# Patient Record
Sex: Female | Born: 1940 | Race: White | Hispanic: No | Smoking: Never smoker
Health system: Southern US, Community
[De-identification: ages and names within clinical notes are randomized; demographics above are authoritative.]

## PROBLEM LIST (undated history)

## (undated) DIAGNOSIS — F329 Major depressive disorder, single episode, unspecified: Secondary | ICD-10-CM

## (undated) DIAGNOSIS — I10 Essential (primary) hypertension: Secondary | ICD-10-CM

## (undated) DIAGNOSIS — E785 Hyperlipidemia, unspecified: Secondary | ICD-10-CM

## (undated) DIAGNOSIS — G309 Alzheimer's disease, unspecified: Secondary | ICD-10-CM

## (undated) DIAGNOSIS — F32A Depression, unspecified: Secondary | ICD-10-CM

## (undated) DIAGNOSIS — K219 Gastro-esophageal reflux disease without esophagitis: Secondary | ICD-10-CM

## (undated) DIAGNOSIS — F0280 Dementia in other diseases classified elsewhere without behavioral disturbance: Secondary | ICD-10-CM

## (undated) HISTORY — DX: Depression, unspecified: F32.A

## (undated) HISTORY — DX: Gastro-esophageal reflux disease without esophagitis: K21.9

## (undated) HISTORY — DX: Major depressive disorder, single episode, unspecified: F32.9

## (undated) HISTORY — DX: Hyperlipidemia, unspecified: E78.5

## (undated) HISTORY — DX: Essential (primary) hypertension: I10

---

## 1976-03-16 HISTORY — PX: TUBAL LIGATION: SHX77

## 2013-06-29 DIAGNOSIS — R918 Other nonspecific abnormal finding of lung field: Secondary | ICD-10-CM | POA: Insufficient documentation

## 2013-09-20 ENCOUNTER — Encounter: Payer: Self-pay | Admitting: Internal Medicine

## 2013-09-20 ENCOUNTER — Encounter (INDEPENDENT_AMBULATORY_CARE_PROVIDER_SITE_OTHER): Payer: Self-pay

## 2013-09-20 ENCOUNTER — Ambulatory Visit (INDEPENDENT_AMBULATORY_CARE_PROVIDER_SITE_OTHER): Payer: Medicare HMO | Admitting: Internal Medicine

## 2013-09-20 VITALS — BP 124/68 | HR 75 | Temp 98.5°F | Wt 168.0 lb

## 2013-09-20 DIAGNOSIS — I10 Essential (primary) hypertension: Secondary | ICD-10-CM

## 2013-09-20 DIAGNOSIS — F329 Major depressive disorder, single episode, unspecified: Secondary | ICD-10-CM

## 2013-09-20 DIAGNOSIS — G309 Alzheimer's disease, unspecified: Secondary | ICD-10-CM

## 2013-09-20 DIAGNOSIS — F3289 Other specified depressive episodes: Secondary | ICD-10-CM

## 2013-09-20 DIAGNOSIS — K219 Gastro-esophageal reflux disease without esophagitis: Secondary | ICD-10-CM

## 2013-09-20 DIAGNOSIS — F0280 Dementia in other diseases classified elsewhere without behavioral disturbance: Secondary | ICD-10-CM | POA: Insufficient documentation

## 2013-09-20 DIAGNOSIS — R413 Other amnesia: Secondary | ICD-10-CM

## 2013-09-20 DIAGNOSIS — F32A Depression, unspecified: Secondary | ICD-10-CM

## 2013-09-20 DIAGNOSIS — E785 Hyperlipidemia, unspecified: Secondary | ICD-10-CM

## 2013-09-20 MED ORDER — DONEPEZIL HCL 5 MG PO TABS
5.0000 mg | ORAL_TABLET | Freq: Every day | ORAL | Status: DC
Start: 2013-09-20 — End: 2013-10-25

## 2013-09-20 MED ORDER — SERTRALINE HCL 50 MG PO TABS: 50.0000 mg | ORAL_TABLET | Freq: Every day | ORAL | Status: DC

## 2013-09-20 MED ORDER — OMEPRAZOLE 20 MG PO CPDR: 20.0000 mg | DELAYED_RELEASE_CAPSULE | Freq: Every day | ORAL | Status: DC

## 2013-09-20 MED ORDER — HYDROCHLOROTHIAZIDE 12.5 MG PO CAPS: 12.5000 mg | ORAL_CAPSULE | Freq: Every day | ORAL | Status: DC

## 2013-09-20 MED ORDER — SIMVASTATIN 20 MG PO TABS: 20.0000 mg | ORAL_TABLET | Freq: Every day | ORAL | Status: DC

## 2013-09-20 NOTE — Patient Instructions (Addendum)
Fat and Cholesterol Control Diet  Fat and cholesterol levels in your blood and organs are influenced by your diet. High levels of fat and cholesterol may lead to diseases of the heart, small and large blood vessels, gallbladder, liver, and pancreas.  CONTROLLING FAT AND CHOLESTEROL WITH DIET  Although exercise and lifestyle factors are important, your diet is key. That is because certain foods are known to raise cholesterol and others to lower it. The goal is to balance foods for their effect on cholesterol and more importantly, to replace saturated and trans fat with other types of fat, such as monounsaturated fat, polyunsaturated fat, and omega-3 fatty acids.  On average, a person should consume no more than 15 to 17 g of saturated fat daily. Saturated and trans fats are considered "bad" fats, and they will raise LDL cholesterol. Saturated fats are primarily found in animal products such as meats, butter, and cream. However, that does not mean you need to give up all your favorite foods. Today, there are good tasting, low-fat, low-cholesterol substitutes for most of the things you like to eat. Choose low-fat or nonfat alternatives. Choose round or loin cuts of red meat. These types of cuts are lowest in fat and cholesterol. Chicken (without the skin), fish, veal, and ground turkey breast are great choices. Eliminate fatty meats, such as hot dogs and salami. Even shellfish have little or no saturated fat. Have a 3 oz (85 g) portion when you eat lean meat, poultry, or fish.  Trans fats are also called "partially hydrogenated oils." They are oils that have been scientifically manipulated so that they are solid at room temperature resulting in a longer shelf life and improved taste and texture of foods in which they are added. Trans fats are found in stick margarine, some tub margarines, cookies, crackers, and baked goods.   When baking and cooking, oils are a great substitute for butter. The monounsaturated oils are  especially beneficial since it is believed they lower LDL and raise HDL. The oils you should avoid entirely are saturated tropical oils, such as coconut and palm.   Remember to eat a lot from food groups that are naturally free of saturated and trans fat, including fish, fruit, vegetables, beans, grains (barley, rice, couscous, bulgur wheat), and pasta (without cream sauces).   IDENTIFYING FOODS THAT LOWER FAT AND CHOLESTEROL   Soluble fiber may lower your cholesterol. This type of fiber is found in fruits such as apples, vegetables such as broccoli, potatoes, and carrots, legumes such as beans, peas, and lentils, and grains such as barley. Foods fortified with plant sterols (phytosterol) may also lower cholesterol. You should eat at least 2 g per day of these foods for a cholesterol lowering effect.   Read package labels to identify low-saturated fats, trans fat free, and low-fat foods at the supermarket. Select cheeses that have only 2 to 3 g saturated fat per ounce. Use a heart-healthy tub margarine that is free of trans fats or partially hydrogenated oil. When buying baked goods (cookies, crackers), avoid partially hydrogenated oils. Breads and muffins should be made from whole grains (whole-wheat or whole oat flour, instead of "flour" or "enriched flour"). Buy non-creamy canned soups with reduced salt and no added fats.   FOOD PREPARATION TECHNIQUES   Never deep-fry. If you must fry, either stir-fry, which uses very little fat, or use non-stick cooking sprays. When possible, broil, bake, or roast meats, and steam vegetables. Instead of putting butter or margarine on vegetables, use lemon   and herbs, applesauce, and cinnamon (for squash and sweet potatoes). Use nonfat yogurt, salsa, and low-fat dressings for salads.   LOW-SATURATED FAT / LOW-FAT FOOD SUBSTITUTES  Meats / Saturated Fat (g)  · Avoid: Steak, marbled (3 oz/85 g) / 11 g  · Choose: Steak, lean (3 oz/85 g) / 4 g  · Avoid: Hamburger (3 oz/85 g) / 7  g  · Choose: Hamburger, lean (3 oz/85 g) / 5 g  · Avoid: Ham (3 oz/85 g) / 6 g  · Choose: Ham, lean cut (3 oz/85 g) / 2.4 g  · Avoid: Chicken, with skin, dark meat (3 oz/85 g) / 4 g  · Choose: Chicken, skin removed, dark meat (3 oz/85 g) / 2 g  · Avoid: Chicken, with skin, light meat (3 oz/85 g) / 2.5 g  · Choose: Chicken, skin removed, light meat (3 oz/85 g) / 1 g  Dairy / Saturated Fat (g)  · Avoid: Whole milk (1 cup) / 5 g  · Choose: Low-fat milk, 2% (1 cup) / 3 g  · Choose: Low-fat milk, 1% (1 cup) / 1.5 g  · Choose: Skim milk (1 cup) / 0.3 g  · Avoid: Hard cheese (1 oz/28 g) / 6 g  · Choose: Skim milk cheese (1 oz/28 g) / 2 to 3 g  · Avoid: Cottage cheese, 4% fat (1 cup) / 6.5 g  · Choose: Low-fat cottage cheese, 1% fat (1 cup) / 1.5 g  · Avoid: Ice cream (1 cup) / 9 g  · Choose: Sherbet (1 cup) / 2.5 g  · Choose: Nonfat frozen yogurt (1 cup) / 0.3 g  · Choose: Frozen fruit bar / trace  · Avoid: Whipped cream (1 tbs) / 3.5 g  · Choose: Nondairy whipped topping (1 tbs) / 1 g  Condiments / Saturated Fat (g)  · Avoid: Mayonnaise (1 tbs) / 2 g  · Choose: Low-fat mayonnaise (1 tbs) / 1 g  · Avoid: Butter (1 tbs) / 7 g  · Choose: Extra light margarine (1 tbs) / 1 g  · Avoid: Coconut oil (1 tbs) / 11.8 g  · Choose: Olive oil (1 tbs) / 1.8 g  · Choose: Corn oil (1 tbs) / 1.7 g  · Choose: Safflower oil (1 tbs) / 1.2 g  · Choose: Sunflower oil (1 tbs) / 1.4 g  · Choose: Soybean oil (1 tbs) / 2.4 g  · Choose: Canola oil (1 tbs) / 1 g  Document Released: 03/02/2005 Document Revised: 06/27/2012 Document Reviewed: 08/21/2010  ExitCare® Patient Information ©2015 [employer]. This information is not intended to replace advice given to you by your health care provider. Make sure you discuss any questions you have with your health care provider.

## 2013-09-20 NOTE — Assessment & Plan Note (Signed)
Improved on Aricept Medication refilled today

## 2013-09-20 NOTE — Assessment & Plan Note (Signed)
Well controlled HCTZ refilled  For 30 days

## 2013-09-20 NOTE — Progress Notes (Signed)
Subjective:    Patient ID: Teresa Francis, female    DOB: 02/17/1941, 73 y.o.   MRN: [id number]  HPI  Pt presents to the clinic today for medication refills. She is a new pt to me. She has an appt to establish care 10/11/13 but reports she is already out of some of her medications. She would like them refilled until her appt arrives.  HTN: Well controlled on HCTZ. She is currently out of her medication.  HLD: Denies myalgias on Zocor. Does adhere to a low fat diet.  Depression: Controlled on Zoloft.  GERD: No issues on Prilosec.  Memory impairment: On Aricept 5 mg daily.    Review of Systems      Past Medical History  Diagnosis Date  . Hypertension   . Hyperlipidemia   . GERD (gastroesophageal reflux disease)   . Depression     Current Outpatient Prescriptions  Medication Sig Dispense Refill  . aspirin 81 MG tablet Take 81 mg by mouth daily.      . Biotin 1000 MCG tablet Take 1,000 mcg by mouth daily.      . bisacodyl (DULCOLAX) 5 MG EC tablet Take 5 mg by mouth daily as needed for moderate constipation.      Marland Kitchen donepezil (ARICEPT) 5 MG tablet Take 5 mg by mouth at bedtime.      . hydrochlorothiazide (MICROZIDE) 12.5 MG capsule Take 12.5 mg by mouth daily.      Marland Kitchen omeprazole (PRILOSEC) 20 MG capsule Take 20 mg by mouth daily.      . sertraline (ZOLOFT) 50 MG tablet Take 50 mg by mouth daily.      . simvastatin (ZOCOR) 20 MG tablet Take 20 mg by mouth daily.      . Vitamin D, Ergocalciferol, (DRISDOL) 50000 UNITS CAPS capsule Take 50,000 Units by mouth every 7 (seven) days.      . vitamin E 400 UNIT capsule Take 400 Units by mouth daily.       No current facility-administered medications for this visit.    Allergies  Allergen Reactions  . Lisinopril Cough    No family history on file.  History   Social History  . Marital Status: Widowed    Spouse Name: N/A    Number of Children: N/A  . Years of Education: N/A   Occupational History  . Not on file.    Social History Main Topics  . Smoking status: Never Smoker   . Smokeless tobacco: Never Used  . Alcohol Use: No  . Drug Use: Not on file  . Sexual Activity: Not on file   Other Topics Concern  . Not on file   Social History Narrative  . No narrative on file     Constitutional: Denies fever, malaise, fatigue, headache or abrupt weight changes.  Respiratory: Denies difficulty breathing, shortness of breath, cough or sputum production.   Cardiovascular: Denies chest pain, chest tightness, palpitations or swelling in the hands or feet.  Gastrointestinal: Denies abdominal pain, bloating, constipation, diarrhea or blood in the stool.   Neurological: Pt reports difficulty with memory. Denies dizziness, difficulty with speech or problems with balance and coordination.   No other specific complaints in a complete review of systems (except as listed in HPI above).  Objective:   Physical Exam  BP 124/68  Pulse 75  Temp(Src) 98.5 F (36.9 C) (Oral)  Wt 168 lb (76.204 kg)  SpO2 97% Wt Readings from Last 3 Encounters:  09/20/13 168 lb (76.204 kg)  General: Appears her stated age, well developed, well nourished in NAD.  Cardiovascular: Normal rate and rhythm. S1,S2 noted.  No murmur, rubs or gallops noted. No JVD or BLE edema. No carotid bruits noted. Pulmonary/Chest: Normal effort and positive vesicular breath sounds. No respiratory distress. No wheezes, rales or ronchi noted.  Abdomen: Soft and nontender. Normal bowel sounds, no bruits noted. No distention or masses noted. Liver, spleen and kidneys non palpable. Neurological: Alert and oriented. Cranial nerves grossly intact. Psychiatric: Mood and affect normal. Behavior is normal. Judgment and thought content normal.        Assessment & Plan:

## 2013-09-20 NOTE — Assessment & Plan Note (Signed)
Denies myalgias on Simvistatin Medication refilled x 30 days

## 2013-09-20 NOTE — Progress Notes (Signed)
Pre visit review using our clinic review tool, if applicable. No additional management support is needed unless otherwise documented below in the visit note. 

## 2013-09-20 NOTE — Assessment & Plan Note (Signed)
Well controlled on Zoloft Medication refilled today

## 2013-09-20 NOTE — Assessment & Plan Note (Signed)
No issues on prilosec Medication refilled today

## 2013-10-11 ENCOUNTER — Ambulatory Visit: Payer: Self-pay | Admitting: Internal Medicine

## 2013-10-25 ENCOUNTER — Telehealth: Payer: Self-pay | Admitting: Internal Medicine

## 2013-10-25 ENCOUNTER — Encounter: Payer: Self-pay | Admitting: Internal Medicine

## 2013-10-25 ENCOUNTER — Ambulatory Visit (INDEPENDENT_AMBULATORY_CARE_PROVIDER_SITE_OTHER): Payer: Medicare HMO | Admitting: Internal Medicine

## 2013-10-25 ENCOUNTER — Encounter (INDEPENDENT_AMBULATORY_CARE_PROVIDER_SITE_OTHER): Payer: Self-pay

## 2013-10-25 VITALS — BP 138/70 | HR 74 | Temp 98.8°F | Ht 69.25 in | Wt 172.0 lb

## 2013-10-25 DIAGNOSIS — Z1239 Encounter for other screening for malignant neoplasm of breast: Secondary | ICD-10-CM

## 2013-10-25 DIAGNOSIS — M810 Age-related osteoporosis without current pathological fracture: Secondary | ICD-10-CM | POA: Insufficient documentation

## 2013-10-25 DIAGNOSIS — F32A Depression, unspecified: Secondary | ICD-10-CM

## 2013-10-25 DIAGNOSIS — R413 Other amnesia: Secondary | ICD-10-CM

## 2013-10-25 DIAGNOSIS — F3289 Other specified depressive episodes: Secondary | ICD-10-CM

## 2013-10-25 DIAGNOSIS — F329 Major depressive disorder, single episode, unspecified: Secondary | ICD-10-CM

## 2013-10-25 DIAGNOSIS — M899 Disorder of bone, unspecified: Secondary | ICD-10-CM

## 2013-10-25 DIAGNOSIS — E785 Hyperlipidemia, unspecified: Secondary | ICD-10-CM

## 2013-10-25 DIAGNOSIS — M949 Disorder of cartilage, unspecified: Secondary | ICD-10-CM

## 2013-10-25 DIAGNOSIS — B351 Tinea unguium: Secondary | ICD-10-CM

## 2013-10-25 DIAGNOSIS — M8580 Other specified disorders of bone density and structure, unspecified site: Secondary | ICD-10-CM

## 2013-10-25 DIAGNOSIS — I10 Essential (primary) hypertension: Secondary | ICD-10-CM

## 2013-10-25 DIAGNOSIS — K219 Gastro-esophageal reflux disease without esophagitis: Secondary | ICD-10-CM

## 2013-10-25 MED ORDER — HYDROCHLOROTHIAZIDE 12.5 MG PO CAPS: 12.5000 mg | ORAL_CAPSULE | Freq: Every day | ORAL | Status: DC

## 2013-10-25 MED ORDER — SERTRALINE HCL 50 MG PO TABS: 50.0000 mg | ORAL_TABLET | Freq: Every day | ORAL | Status: DC

## 2013-10-25 MED ORDER — OMEPRAZOLE 20 MG PO CPDR: 20.0000 mg | DELAYED_RELEASE_CAPSULE | Freq: Every day | ORAL | Status: DC

## 2013-10-25 MED ORDER — DONEPEZIL HCL 10 MG PO TABS: 10.0000 mg | ORAL_TABLET | Freq: Every day | ORAL | Status: DC

## 2013-10-25 MED ORDER — SIMVASTATIN 20 MG PO TABS: 20.0000 mg | ORAL_TABLET | Freq: Every day | ORAL | Status: DC

## 2013-10-25 NOTE — Progress Notes (Signed)
HPI  Pt presents to the clinic today to establish care. She recently moved [place]. She does have some concerns about a toenail on her left big toe. It is thick and discolored. She has not tried anything OTC. She would like to be referred to a podiatrist.  Flu: 10/20014 Tetanus: more than 10 years ago Pneumovax: 07/2013 Zostovax: never Mammogram: Due Pap Smear: no longer screening Colon Screening: Within the last 5 years Bone Density: more than 5 years ago Eye doctor: 10/2013 Dentist: no-dentures  HTN: Well controlled on HCTZ  HLD: Denies myalgias on zocor  GERD: Controlled on prilosec  Depression: Doing well. Recent increase in zoloft.  Osteopenia: On Vit D supplement.  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 Daughter notices more confusion, repetition and forgetfulness.  Past Medical History  Diagnosis Date  . Hypertension   . Hyperlipidemia   . GERD (gastroesophageal reflux disease)   . Depression     Current Outpatient Prescriptions  Medication Sig Dispense Refill  . aspirin 81 MG tablet Take 81 mg by mouth daily.      . Biotin 1000 MCG tablet Take 1,000 mcg by mouth daily.      . bisacodyl (DULCOLAX) 5 MG EC tablet Take 5 mg by mouth daily as needed for moderate constipation.      Marland Kitchen donepezil (ARICEPT) 5 MG tablet Take 1 tablet (5 mg total) by mouth at bedtime.  30 tablet  0  . hydrochlorothiazide (MICROZIDE) 12.5 MG capsule Take 1 capsule (12.5 mg total) by mouth daily.  30 capsule  0  . omeprazole (PRILOSEC) 20 MG capsule Take 1 capsule (20 mg total) by mouth daily.  30 capsule  0  . sertraline (ZOLOFT) 50 MG tablet Take 1 tablet (50 mg total) by mouth daily.  30 tablet  0  . simvastatin (ZOCOR) 20 MG tablet Take 1 tablet (20 mg total) by mouth daily.  30 tablet  0  . Vitamin D, Ergocalciferol, (DRISDOL) 50000 UNITS CAPS capsule Take 50,000 Units by mouth every 7 (seven) days.      . vitamin E 400 UNIT capsule Take 400 Units by mouth daily.       No current  facility-administered medications for this visit.    Allergies  Allergen Reactions  . Lisinopril Cough    Family History  Problem Relation Age of Onset  . Heart disease Mother   . Hypertension Mother   . Arthritis Father   . Cancer Father     Prostate  . Cancer Sister     Breast  . Cancer Brother     Throat  . Hypertension Daughter   . Cancer Son     Lung  . Diabetes Paternal Aunt   . Diabetes Paternal Uncle   . Diabetes Paternal Grandfather   . Cancer Sister     Breast  . Cancer Sister     Breast  . Cancer Brother     Prostate  . Diabetes Daughter     Uterine    History   Social History  . Marital Status: Widowed    Spouse Name: N/A    Number of Children: N/A  . Years of Education: N/A   Occupational History  . Not on file.   Social History Main Topics  . Smoking status: Never Smoker   . Smokeless tobacco: Never Used  . Alcohol Use: No  . Drug Use: Not on file  . Sexual Activity: Not on file   Other Topics Concern  . Not on  file   Social History Narrative  . No narrative on file    ROS:  Constitutional: Denies fever, malaise, fatigue, headache or abrupt weight changes.  Respiratory: Denies difficulty breathing, shortness of breath, cough or sputum production.   Cardiovascular: Denies chest pain, chest tightness, palpitations or swelling in the hands or feet.  Gastrointestinal: Denies abdominal pain, bloating, constipation, diarrhea or blood in the stool.  Neurological: Pt reports difficulty with memory. Denies dizziness, difficulty with speech or problems with balance and coordination.   No other specific complaints in a complete review of systems (except as listed in HPI above).  PE:  BP 138/70  Pulse 74  Temp(Src) 98.8 F (37.1 C) (Oral)  Ht 5' 9.25" (1.759 m)  Wt 172 lb (78.019 kg)  BMI 25.22 kg/m2  SpO2 97% Wt Readings from Last 3 Encounters:  10/25/13 172 lb (78.019 kg)  09/20/13 168 lb (76.204 kg)    General: Appears her  stated age, well developed, well nourished in NAD. Cardiovascular: Normal rate and rhythm. S1,S2 noted.  No murmur, rubs or gallops noted. No JVD or BLE edema. No carotid bruits noted. Pulmonary/Chest: Normal effort and positive vesicular breath sounds. No respiratory distress. No wheezes, rales or ronchi noted.  Abdomen: Soft and nontender. Normal bowel sounds, no bruits noted. No distention or masses noted. Liver, spleen and kidneys non palpable. Neurological: Alert and oriented.  Psychiatric: Mood and affect normal. Behavior is normal. Judgment and thought content normal.      Assessment and Plan:  Preventative Health Maintenance:  Will order mammogram  Will order bone density She will call insurance about zostovax She declines tetanus vaccine today  Toe fungus:  Will refer to podiatry per pt request  RTC in 6 months or sooner if needed

## 2013-10-25 NOTE — Assessment & Plan Note (Addendum)
Stable on current dose of zoloft

## 2013-10-25 NOTE — Telephone Encounter (Signed)
Relevant patient education assigned to patient using Emmi. ° °

## 2013-10-25 NOTE — Assessment & Plan Note (Signed)
No issues on prilosec

## 2013-10-25 NOTE — Assessment & Plan Note (Signed)
Seems to be worsening Will increase Aricept to 10 mg daily

## 2013-10-25 NOTE — Assessment & Plan Note (Signed)
Well controlled on zocor Will check lipid profile and CMET today

## 2013-10-25 NOTE — Assessment & Plan Note (Signed)
Well controlled Try holding HCTZ Monitor BP, if > 140/90 call me and we will restart medications Will check CBC and CMET today

## 2013-10-25 NOTE — Patient Instructions (Signed)
Ringworm, Nail A fungal infection of the nail (tinea unguium/onychomycosis) is common. It is common as the visible part of the nail is composed of dead cells which have no blood supply to help prevent infection. It occurs because fungi are everywhere and will pick any opportunity to grow on any dead material. Because nails are very slow growing they require up to 2 years of treatment with anti-fungal medications. The entire nail back to the base is infected. This includes approximately  of the nail which you cannot see. If your caregiver has prescribed a medication by mouth, take it every day and as directed. No progress will be seen for at least 6 to 9 months. Do not be disappointed! Because fungi live on dead cells with little or no exposure to blood supply, medication delivery to the infection is slow; thus the cure is slow. It is also why you can observe no progress in the first 6 months. The nail becoming cured is the base of the nail, as it has the blood supply. Topical medication such as creams and ointments are usually not effective. Important in successful treatment of nail fungus is closely following the medication regimen that your doctor prescribes. Sometimes you and your caregiver may elect to speed up this process by surgical removal of all the nails. Even this may still require 6 to 9 months of additional oral medications. See your caregiver as directed. Remember there will be no visible improvement for at least 6 months. See your caregiver sooner if other signs of infection (redness and swelling) develop. Document Released: 02/28/2000 Document Revised: 05/25/2011 Document Reviewed: 05/08/2008 ExitCare Patient Information 2015 [employer]. This information is not intended to replace advice given to you by your health care provider. Make sure you discuss any questions you have with your health care provider.  

## 2013-10-25 NOTE — Progress Notes (Signed)
Pre visit review using our clinic review tool, if applicable. No additional management support is needed unless otherwise documented below in the visit note. 

## 2013-10-25 NOTE — Assessment & Plan Note (Signed)
Will check Vit D today 

## 2013-10-26 LAB — COMPREHENSIVE METABOLIC PANEL
ALT: 11 U/L (ref 0–35)
AST: 20 U/L (ref 0–37)
Albumin: 3.9 g/dL (ref 3.5–5.2)
Alkaline Phosphatase: 78 U/L (ref 39–117)
BILIRUBIN TOTAL: 0.7 mg/dL (ref 0.2–1.2)
BUN: 19 mg/dL (ref 6–23)
CO2: 29 mEq/L (ref 19–32)
Calcium: 9.2 mg/dL (ref 8.4–10.5)
Chloride: 102 mEq/L (ref 96–112)
Creatinine, Ser: 0.9 mg/dL (ref 0.4–1.2)
GFR: 68.4 mL/min (ref >60.00)
Glucose, Bld: 73 mg/dL (ref 70–99)
Potassium: 3.4 mEq/L — ABNORMAL LOW (ref 3.5–5.1)
Sodium: 140 mEq/L (ref 135–145)
Total Protein: 6.8 g/dL (ref 6.0–8.3)

## 2013-10-26 LAB — CBC
HEMATOCRIT: 39.5 % (ref 36.0–46.0)
Hemoglobin: 12.9 g/dL (ref 12.0–15.0)
MCHC: 32.6 g/dL (ref 30.0–36.0)
MCV: 85.5 fl (ref 78.0–100.0)
PLATELETS: 307 10*3/uL (ref 150.0–400.0)
RBC: 4.62 Mil/uL (ref 3.87–5.11)
RDW: 14.4 % (ref 11.5–15.5)
WBC: 8.6 10*3/uL (ref 4.0–10.5)

## 2013-10-26 LAB — VITAMIN D 25 HYDROXY (VIT D DEFICIENCY, FRACTURES): VITD: 55.3 ng/mL (ref 30.00–100.00)

## 2013-10-26 LAB — LIPID PANEL
Cholesterol: 211 mg/dL — ABNORMAL HIGH (ref 0–200)
HDL: 42.7 mg/dL (ref >39.00)
NONHDL: 168.3
TRIGLYCERIDES: 229 mg/dL — AB (ref 0.0–149.0)
Total CHOL/HDL Ratio: 5
VLDL: 45.8 mg/dL — AB (ref 0.0–40.0)

## 2013-10-26 LAB — LDL CHOLESTEROL, DIRECT: Direct LDL: 145.9 mg/dL

## 2013-10-27 MED ORDER — SIMVASTATIN 40 MG PO TABS: 40.0000 mg | ORAL_TABLET | Freq: Every day | ORAL | Status: DC

## 2013-10-27 NOTE — Addendum Note (Signed)
Addended by: Lurlean Nanny on: 10/27/2013 09:27 AM   Modules accepted: Orders

## 2013-11-08 ENCOUNTER — Ambulatory Visit: Payer: Commercial Managed Care - HMO | Admitting: Podiatry

## 2013-11-22 ENCOUNTER — Encounter: Payer: Self-pay | Admitting: Podiatry

## 2013-11-22 ENCOUNTER — Ambulatory Visit (INDEPENDENT_AMBULATORY_CARE_PROVIDER_SITE_OTHER): Payer: Commercial Managed Care - HMO | Admitting: Podiatry

## 2013-11-22 VITALS — BP 158/80 | HR 71 | Resp 16 | Ht 69.0 in | Wt 175.0 lb

## 2013-11-22 DIAGNOSIS — L600 Ingrowing nail: Secondary | ICD-10-CM

## 2013-11-22 MED ORDER — NEOMYCIN-POLYMYXIN-HC 3.5-10000-1 OT SOLN: OTIC | Status: DC

## 2013-11-22 NOTE — Patient Instructions (Signed)

## 2013-11-22 NOTE — Progress Notes (Signed)
   Subjective:    Patient ID: Teresa Francis, female    DOB: 03-02-1941, 73 y.o.   MRN: [id number]  HPI Comments: Its my big toenail on my left foot. Its growing in my skin. If i pick at it, it will hurt. Its been like this for 1 year. Its getting worse. i trim my toenails.     Review of Systems  Skin:       Change in nails  Hematological: Bruises/bleeds easily.  Psychiatric/Behavioral: Positive for confusion.  All other systems reviewed and are negative.      Objective:   Physical Exam: I have reviewed her past medical history medications allergies surgeries social history and review of systems. Pulses are strongly palpable bilateral. Neurologic sensorium is intact per since once the monofilament. Deep tendon reflexes are intact bilateral muscle strength is 5 over 5 dorsiflexors plantar flexors inverters everters all intrinsic musculature is intact. Orthopedic evaluation demonstrates all joints distal to the ankle a full range of motion without crepitation. 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 Cutaneous evaluation demonstrates supple well hydrated cutis with exception of her reactive porokeratotic lesion lateral aspect of the fifth metatarsal head left foot. She also has nail dystrophy to toes #1 and #2 of the left foot and #2 of the right foot. The hallux nail left his extremely painful for her. This is hard to cut and hangs on her clothing.        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  Plan: Total nail avulsion with matrixectomy left hallux today after local anesthetic a been administered. She tolerated procedure well and all was applied she was her soaking on a twice a day basis and Betadine water I will followup with her in one week.

## 2013-11-29 ENCOUNTER — Other Ambulatory Visit: Payer: Self-pay

## 2013-11-29 ENCOUNTER — Ambulatory Visit (INDEPENDENT_AMBULATORY_CARE_PROVIDER_SITE_OTHER): Payer: Commercial Managed Care - HMO | Admitting: Podiatry

## 2013-11-29 ENCOUNTER — Encounter: Payer: Self-pay | Admitting: Podiatry

## 2013-11-29 DIAGNOSIS — R413 Other amnesia: Secondary | ICD-10-CM

## 2013-11-29 DIAGNOSIS — K219 Gastro-esophageal reflux disease without esophagitis: Secondary | ICD-10-CM

## 2013-11-29 DIAGNOSIS — L600 Ingrowing nail: Secondary | ICD-10-CM

## 2013-11-29 MED ORDER — SIMVASTATIN 40 MG PO TABS: 40.0000 mg | ORAL_TABLET | Freq: Every day | ORAL | Status: DC

## 2013-11-29 MED ORDER — OMEPRAZOLE 20 MG PO CPDR: 20.0000 mg | DELAYED_RELEASE_CAPSULE | Freq: Every day | ORAL | Status: DC

## 2013-11-29 MED ORDER — DONEPEZIL HCL 10 MG PO TABS: 10.0000 mg | ORAL_TABLET | Freq: Every day | ORAL | Status: DC

## 2013-11-29 NOTE — Progress Notes (Signed)
She presents today for followup of a total nail avulsion hallux left. She denies fever chills nausea vomiting muscle aches and pains. States that she's been soaking in Betadine warm water. She states that she's had absolutely no pain with this procedure.  Objective: Vital signs are stable she is alert and oriented x3. There is no erythema edema cellulitis drainage or odor does mild areas of reepithelialization which appears to be healing quite nicely. I see no signs of infection.  Assessment: Well-healing surgical toe hallux left.  Plan: Discontinue Betadine start with Epsom salts in warm water soaks continue to cover during the day and leave open at night. Continue to soak and to completely resolved. Been stopped when there is no erythema cellulitis drainage or odor pain.

## 2013-11-29 NOTE — Telephone Encounter (Signed)
Teresa Francis pts daughter having problems getting meds from [facility] mail order and request 2 week rx for donepezil,l omeprazole and simvastatin to [facility]. Advised done.

## 2014-01-16 ENCOUNTER — Telehealth: Payer: Self-pay

## 2014-01-16 NOTE — Telephone Encounter (Signed)
Bevelyn Buckles left v/m; [name] said Webb Silversmith NP had increased dose of simvastatin to 40 mg; Traer said pt already has simvastatin 20 mg rx and [employer] cancelled the simvastatin 40 mg rx. [name] said if pt is supposed to take simvastatin 40 mg; gail request new rx sent to [facility] for simvastatin 40 mg and have simvastatin 20 mg cancelled. [name] request cb.

## 2014-01-16 NOTE — Telephone Encounter (Signed)
Should be on 40 mg zocor daily. Please send new RX

## 2014-01-17 MED ORDER — SIMVASTATIN 40 MG PO TABS: 40.0000 mg | ORAL_TABLET | Freq: Every day | ORAL | Status: DC

## 2014-01-17 NOTE — Telephone Encounter (Signed)
Rx resent to pharmacy and asked to cancel 20mg  Rx

## 2014-01-17 NOTE — Addendum Note (Signed)
Addended by: Lurlean Nanny on: 01/17/2014 10:19 AM   Modules accepted: Orders

## 2014-03-16 HISTORY — PX: CHOLECYSTECTOMY: SHX55

## 2014-05-02 ENCOUNTER — Ambulatory Visit (INDEPENDENT_AMBULATORY_CARE_PROVIDER_SITE_OTHER): Payer: Commercial Managed Care - HMO | Admitting: Internal Medicine

## 2014-05-02 ENCOUNTER — Encounter: Payer: Self-pay | Admitting: Internal Medicine

## 2014-05-02 VITALS — BP 140/90 | HR 68 | Temp 98.5°F | Wt 177.0 lb

## 2014-05-02 DIAGNOSIS — R413 Other amnesia: Secondary | ICD-10-CM

## 2014-05-02 DIAGNOSIS — Z23 Encounter for immunization: Secondary | ICD-10-CM | POA: Diagnosis not present

## 2014-05-02 DIAGNOSIS — F32A Depression, unspecified: Secondary | ICD-10-CM

## 2014-05-02 DIAGNOSIS — K219 Gastro-esophageal reflux disease without esophagitis: Secondary | ICD-10-CM | POA: Diagnosis not present

## 2014-05-02 DIAGNOSIS — M8580 Other specified disorders of bone density and structure, unspecified site: Secondary | ICD-10-CM | POA: Diagnosis not present

## 2014-05-02 DIAGNOSIS — F329 Major depressive disorder, single episode, unspecified: Secondary | ICD-10-CM

## 2014-05-02 DIAGNOSIS — E785 Hyperlipidemia, unspecified: Secondary | ICD-10-CM

## 2014-05-02 DIAGNOSIS — I10 Essential (primary) hypertension: Secondary | ICD-10-CM

## 2014-05-02 MED ORDER — OMEPRAZOLE 40 MG PO CPDR: 40.0000 mg | DELAYED_RELEASE_CAPSULE | Freq: Every day | ORAL | Status: DC

## 2014-05-02 MED ORDER — DONEPEZIL HCL 10 MG PO TABS: 10.0000 mg | ORAL_TABLET | Freq: Every day | ORAL | Status: DC

## 2014-05-02 MED ORDER — SERTRALINE HCL 50 MG PO TABS: 50.0000 mg | ORAL_TABLET | Freq: Every day | ORAL | Status: DC

## 2014-05-02 NOTE — Patient Instructions (Signed)
Fat and Cholesterol Control Diet Fat and cholesterol levels in your blood and organs are influenced by your diet. High levels of fat and cholesterol may lead to diseases of the heart, small and large blood vessels, gallbladder, liver, and pancreas. CONTROLLING FAT AND CHOLESTEROL WITH DIET Although exercise and lifestyle factors are important, your diet is key. That is because certain foods are known to raise cholesterol and others to lower it. The goal is to balance foods for their effect on cholesterol and more importantly, to replace saturated and trans fat with other types of fat, such as monounsaturated fat, polyunsaturated fat, and omega-3 fatty acids. On average, a person should consume no more than 15 to 17 g of saturated fat daily. Saturated and trans fats are considered "bad" fats, and they will raise LDL cholesterol. Saturated fats are primarily found in animal products such as meats, butter, and cream. However, that does not mean you need to give up all your favorite foods. Today, there are good tasting, low-fat, low-cholesterol substitutes for most of the things you like to eat. Choose low-fat or nonfat alternatives. Choose round or loin cuts of red meat. These types of cuts are lowest in fat and cholesterol. Chicken (without the skin), fish, veal, and ground turkey breast are great choices. Eliminate fatty meats, such as hot dogs and salami. Even shellfish have little or no saturated fat. Have a 3 oz (85 g) portion when you eat lean meat, poultry, or fish. Trans fats are also called "partially hydrogenated oils." They are oils that have been scientifically manipulated so that they are solid at room temperature resulting in a longer shelf life and improved taste and texture of foods in which they are added. Trans fats are found in stick margarine, some tub margarines, cookies, crackers, and baked goods.  When baking and cooking, oils are a great substitute for butter. The monounsaturated oils are  especially beneficial since it is believed they lower LDL and raise HDL. The oils you should avoid entirely are saturated tropical oils, such as coconut and palm.  Remember to eat a lot from food groups that are naturally free of saturated and trans fat, including fish, fruit, vegetables, beans, grains (barley, rice, couscous, bulgur wheat), and pasta (without cream sauces).  IDENTIFYING FOODS THAT LOWER FAT AND CHOLESTEROL  Soluble fiber may lower your cholesterol. This type of fiber is found in fruits such as apples, vegetables such as broccoli, potatoes, and carrots, legumes such as beans, peas, and lentils, and grains such as barley. Foods fortified with plant sterols (phytosterol) may also lower cholesterol. You should eat at least 2 g per day of these foods for a cholesterol lowering effect.  Read package labels to identify low-saturated fats, trans fat free, and low-fat foods at the supermarket. Select cheeses that have only 2 to 3 g saturated fat per ounce. Use a heart-healthy tub margarine that is free of trans fats or partially hydrogenated oil. When buying baked goods (cookies, crackers), avoid partially hydrogenated oils. Breads and muffins should be made from whole grains (whole-wheat or whole oat flour, instead of "flour" or "enriched flour"). Buy non-creamy canned soups with reduced salt and no added fats.  FOOD PREPARATION TECHNIQUES  Never deep-fry. If you must fry, either stir-fry, which uses very little fat, or use non-stick cooking sprays. When possible, broil, bake, or roast meats, and steam vegetables. Instead of putting butter or margarine on vegetables, use lemon and herbs, applesauce, and cinnamon (for squash and sweet potatoes). Use nonfat   yogurt, salsa, and low-fat dressings for salads.  LOW-SATURATED FAT / LOW-FAT FOOD SUBSTITUTES Meats / Saturated Fat (g)  Avoid: Steak, marbled (3 oz/85 g) / 11 g  Choose: Steak, lean (3 oz/85 g) / 4 g  Avoid: Hamburger (3 oz/85 g) / 7  g  Choose: Hamburger, lean (3 oz/85 g) / 5 g  Avoid: Ham (3 oz/85 g) / 6 g  Choose: Ham, lean cut (3 oz/85 g) / 2.4 g  Avoid: Chicken, with skin, dark meat (3 oz/85 g) / 4 g  Choose: Chicken, skin removed, dark meat (3 oz/85 g) / 2 g  Avoid: Chicken, with skin, light meat (3 oz/85 g) / 2.5 g  Choose: Chicken, skin removed, light meat (3 oz/85 g) / 1 g Dairy / Saturated Fat (g)  Avoid: Whole milk (1 cup) / 5 g  Choose: Low-fat milk, 2% (1 cup) / 3 g  Choose: Low-fat milk, 1% (1 cup) / 1.5 g  Choose: Skim milk (1 cup) / 0.3 g  Avoid: Hard cheese (1 oz/28 g) / 6 g  Choose: Skim milk cheese (1 oz/28 g) / 2 to 3 g  Avoid: Cottage cheese, 4% fat (1 cup) / 6.5 g  Choose: Low-fat cottage cheese, 1% fat (1 cup) / 1.5 g  Avoid: Ice cream (1 cup) / 9 g  Choose: Sherbet (1 cup) / 2.5 g  Choose: Nonfat frozen yogurt (1 cup) / 0.3 g  Choose: Frozen fruit bar / trace  Avoid: Whipped cream (1 tbs) / 3.5 g  Choose: Nondairy whipped topping (1 tbs) / 1 g Condiments / Saturated Fat (g)  Avoid: Mayonnaise (1 tbs) / 2 g  Choose: Low-fat mayonnaise (1 tbs) / 1 g  Avoid: Butter (1 tbs) / 7 g  Choose: Extra light margarine (1 tbs) / 1 g  Avoid: Coconut oil (1 tbs) / 11.8 g  Choose: Olive oil (1 tbs) / 1.8 g  Choose: Corn oil (1 tbs) / 1.7 g  Choose: Safflower oil (1 tbs) / 1.2 g  Choose: Sunflower oil (1 tbs) / 1.4 g  Choose: Soybean oil (1 tbs) / 2.4 g  Choose: Canola oil (1 tbs) / 1 g Document Released: 03/02/2005 Document Revised: 06/27/2012 Document Reviewed: 05/31/2013 [employer] Patient Information 2015 [employer]. This information is not intended to replace advice given to you by your health care provider. Make sure you discuss any questions you have with your health care provider.  

## 2014-05-02 NOTE — Assessment & Plan Note (Signed)
She is not taking any calcium or vit D supplement She never did her bone density ordered 10/2013 Will have her see Rosaria Ferries on the way out to schedule bone density

## 2014-05-02 NOTE — Assessment & Plan Note (Signed)
Mild but stable Continue Aricept at this time

## 2014-05-02 NOTE — Assessment & Plan Note (Signed)
Stable on current dose of Zoloft  Wean not appropriate

## 2014-05-02 NOTE — Assessment & Plan Note (Addendum)
No issues on Zocor Will check CMET and Lipid profile today Encouraged low fat diet

## 2014-05-02 NOTE — Progress Notes (Addendum)
Subjective:    Patient ID: Teresa Francis, female    DOB: 02-27-1941, 74 y.o.   MRN: [id number]  HPI  Pt presents to the clinic today for 6 month follow up of chronic medical conditions.  Depression: Mood stable on current dose of Zoloft.  HTN: Not medicated. BP today 140/90.  GERD: She reports belching. She is taking Prilosec 20 mg daily.  HLD: Denies myalgias on Zocor. Last lipid profile reviewed. LDL.  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  Osteopenia: Bone density was ordered 10/2013 but not completed.  Review of Systems      Past Medical History  Diagnosis Date  . Hypertension   . Hyperlipidemia   . GERD (gastroesophageal reflux disease)   . Depression     Current Outpatient Prescriptions  Medication Sig Dispense Refill  . aspirin 81 MG tablet Take 81 mg by mouth daily.    . Biotin 1000 MCG tablet Take 1,000 mcg by mouth daily.    . bisacodyl (DULCOLAX) 5 MG EC tablet Take 5 mg by mouth daily as needed for moderate constipation.    Marland Kitchen donepezil (ARICEPT) 10 MG tablet Take 1 tablet (10 mg total) by mouth at bedtime. 14 tablet 1  . neomycin-polymyxin-hydrocortisone (CORTISPORIN) otic solution Apply one to two drops to toe after soaking twice daily. 10 mL 1  . omeprazole (PRILOSEC) 20 MG capsule Take 1 capsule (20 mg total) by mouth daily. 14 capsule 1  . sertraline (ZOLOFT) 50 MG tablet Take 1 tablet (50 mg total) by mouth daily. 90 tablet 1  . simvastatin (ZOCOR) 40 MG tablet Take 1 tablet (40 mg total) by mouth daily. 90 tablet 1  . Vitamin D, Ergocalciferol, (DRISDOL) 50000 UNITS CAPS capsule Take 50,000 Units by mouth every 7 (seven) days.    . vitamin E 400 UNIT capsule Take 400 Units by mouth daily.     No current facility-administered medications for this visit.    Allergies  Allergen Reactions  . Lisinopril Cough    Family History  Problem Relation Age of Onset  . Heart disease Mother   . Hypertension Mother   . Arthritis Father   . Cancer  Father     Prostate  . Cancer Sister     Breast  . Cancer Brother     Throat  . Hypertension Daughter   . Cancer Son     Lung  . Diabetes Paternal Aunt   . Diabetes Paternal Uncle   . Diabetes Paternal Grandfather   . Cancer Sister     Breast  . Cancer Sister     Breast  . Cancer Brother     Prostate  . Diabetes Daughter     Uterine  . Stroke Neg Hx     History   Social History  . Marital Status: Widowed    Spouse Name: N/A  . Number of Children: N/A  . Years of Education: N/A   Occupational History  . Not on file.   Social History Main Topics  . Smoking status: Never Smoker   . Smokeless tobacco: Never Used  . Alcohol Use: No  . Drug Use: No  . Sexual Activity: No   Other Topics Concern  . Not on file   Social History Narrative  . No narrative on file     Constitutional: Denies fever, malaise, fatigue, headache or abrupt weight changes.  Respiratory: Denies difficulty breathing, shortness of breath, cough or sputum production.   Cardiovascular: Denies chest pain, chest  tightness, palpitations or swelling in the hands or feet.  Gastrointestinal: Pt reports belching. Denies abdominal pain, bloating, constipation, diarrhea or blood in the stool.  Musculoskeletal: Denies decrease in range of motion, difficulty with gait, muscle pain or joint pain and swelling.  Skin: Denies redness, rashes, lesions or ulcercations.  Neurological: Denies dizziness, difficulty with memory, difficulty with speech or problems with balance and coordination.  Psych: Pt reports history of depression. Pt denies anxiety, SI/HI.  No other specific complaints in a complete review of systems (except as listed in HPI above).  Objective:   Physical Exam   BP 140/90 mmHg  Pulse 68  Temp(Src) 98.5 F (36.9 C) (Oral)  Wt 177 lb (80.287 kg)  SpO2 95% Wt Readings from Last 3 Encounters:  05/02/14 177 lb (80.287 kg)  11/22/13 175 lb (79.379 kg)  10/25/13 172 lb (78.019 kg)     General: Appears her stated age, well developed, well nourished in NAD. Skin: Warm, dry and intact. No rashes, lesions or ulcerations noted. Cardiovascular: Normal rate and rhythm. S1,S2 noted.  No murmur, rubs or gallops noted. No JVD or BLE edema. No carotid bruits noted. Pulmonary/Chest: Normal effort and positive vesicular breath sounds. No respiratory distress. No wheezes, rales or ronchi noted.  Abdomen: Soft and nontender. Normal bowel sounds, no bruits noted. No distention or masses noted. Liver, spleen and kidneys non palpable. Neurological: Engages, pleasent. Year: 1916, 2016. President: Tawni Pummel, wait that's not right, I don't know his name but he is black. Recall: WORLD, ELROW. Psychiatric: Mood and affect normal. Behavior is normal. Judgment and thought content normal.     BMET    Component Value Date/Time   NA 140 10/25/2013 1548   K 3.4* 10/25/2013 1548   CL 102 10/25/2013 1548   CO2 29 10/25/2013 1548   GLUCOSE 73 10/25/2013 1548   BUN 19 10/25/2013 1548   CREATININE 0.9 10/25/2013 1548   CALCIUM 9.2 10/25/2013 1548    Lipid Panel     Component Value Date/Time   CHOL 211* 10/25/2013 1548   TRIG 229.0* 10/25/2013 1548   HDL 42.70 10/25/2013 1548   CHOLHDL 5 10/25/2013 1548   VLDL 45.8* 10/25/2013 1548    CBC    Component Value Date/Time   WBC 8.6 10/25/2013 1548   RBC 4.62 10/25/2013 1548   HGB 12.9 10/25/2013 1548   HCT 39.5 10/25/2013 1548   PLT 307.0 10/25/2013 1548   MCV 85.5 10/25/2013 1548   MCHC 32.6 10/25/2013 1548   RDW 14.4 10/25/2013 1548    Hgb A1C No results found for: HGBA1C      Assessment & Plan:

## 2014-05-02 NOTE — Assessment & Plan Note (Signed)
Deteriorated Will increase Prilosec to 40 mg daily Handout given on diet information for GERD

## 2014-05-02 NOTE — Assessment & Plan Note (Signed)
Borderline today Not medicated Will check CBC and CMET today

## 2014-05-03 LAB — COMPREHENSIVE METABOLIC PANEL
ALK PHOS: 94 U/L (ref 39–117)
ALT: 11 U/L (ref 0–35)
AST: 21 U/L (ref 0–37)
Albumin: 4.2 g/dL (ref 3.5–5.2)
BUN: 19 mg/dL (ref 6–23)
CALCIUM: 9.6 mg/dL (ref 8.4–10.5)
CHLORIDE: 103 meq/L (ref 96–112)
CO2: 30 mEq/L (ref 19–32)
Creatinine, Ser: 0.98 mg/dL (ref 0.40–1.20)
GFR: 58.74 mL/min — ABNORMAL LOW (ref >60.00)
Glucose, Bld: 78 mg/dL (ref 70–99)
POTASSIUM: 4.1 meq/L (ref 3.5–5.1)
SODIUM: 140 meq/L (ref 135–145)
TOTAL PROTEIN: 7.2 g/dL (ref 6.0–8.3)
Total Bilirubin: 0.6 mg/dL (ref 0.2–1.2)

## 2014-05-03 LAB — LIPID PANEL
CHOL/HDL RATIO: 4
CHOLESTEROL: 199 mg/dL (ref 0–200)
HDL: 47.8 mg/dL (ref >39.00)
LDL Cholesterol: 121 mg/dL — ABNORMAL HIGH (ref 0–99)
NonHDL: 151.2
Triglycerides: 151 mg/dL — ABNORMAL HIGH (ref 0.0–149.0)
VLDL: 30.2 mg/dL (ref 0.0–40.0)

## 2014-05-03 LAB — CBC
HEMATOCRIT: 42.9 % (ref 36.0–46.0)
Hemoglobin: 14.3 g/dL (ref 12.0–15.0)
MCHC: 33.3 g/dL (ref 30.0–36.0)
MCV: 84.6 fl (ref 78.0–100.0)
Platelets: 301 10*3/uL (ref 150.0–400.0)
RBC: 5.07 Mil/uL (ref 3.87–5.11)
RDW: 14.8 % (ref 11.5–15.5)
WBC: 9.4 10*3/uL (ref 4.0–10.5)

## 2014-05-04 NOTE — Addendum Note (Signed)
Addended by: Lurlean Nanny on: 05/04/2014 04:04 PM   Modules accepted: Orders

## 2014-05-07 ENCOUNTER — Encounter: Payer: Self-pay | Admitting: Internal Medicine

## 2014-05-08 ENCOUNTER — Other Ambulatory Visit: Payer: Self-pay | Admitting: Internal Medicine

## 2014-05-08 DIAGNOSIS — R413 Other amnesia: Secondary | ICD-10-CM

## 2014-05-08 MED ORDER — DONEPEZIL HCL 10 MG PO TABS: 10.0000 mg | ORAL_TABLET | Freq: Every day | ORAL | Status: DC

## 2014-05-08 MED ORDER — OMEPRAZOLE 40 MG PO CPDR: 40.0000 mg | DELAYED_RELEASE_CAPSULE | Freq: Every day | ORAL | Status: DC

## 2014-05-27 DIAGNOSIS — R05 Cough: Secondary | ICD-10-CM | POA: Diagnosis not present

## 2014-05-27 DIAGNOSIS — K810 Acute cholecystitis: Secondary | ICD-10-CM | POA: Diagnosis not present

## 2014-05-27 DIAGNOSIS — K8020 Calculus of gallbladder without cholecystitis without obstruction: Secondary | ICD-10-CM | POA: Diagnosis not present

## 2014-05-28 DIAGNOSIS — I96 Gangrene, not elsewhere classified: Secondary | ICD-10-CM | POA: Diagnosis not present

## 2014-05-28 DIAGNOSIS — K810 Acute cholecystitis: Secondary | ICD-10-CM | POA: Diagnosis not present

## 2014-05-28 DIAGNOSIS — I10 Essential (primary) hypertension: Secondary | ICD-10-CM | POA: Diagnosis not present

## 2014-05-28 DIAGNOSIS — K219 Gastro-esophageal reflux disease without esophagitis: Secondary | ICD-10-CM | POA: Diagnosis not present

## 2014-05-28 DIAGNOSIS — R05 Cough: Secondary | ICD-10-CM | POA: Diagnosis not present

## 2014-05-28 DIAGNOSIS — Z808 Family history of malignant neoplasm of other organs or systems: Secondary | ICD-10-CM | POA: Diagnosis not present

## 2014-05-28 DIAGNOSIS — Z801 Family history of malignant neoplasm of trachea, bronchus and lung: Secondary | ICD-10-CM | POA: Diagnosis not present

## 2014-05-28 DIAGNOSIS — Z800 Family history of malignant neoplasm of digestive organs: Secondary | ICD-10-CM | POA: Diagnosis not present

## 2014-05-28 DIAGNOSIS — D72829 Elevated white blood cell count, unspecified: Secondary | ICD-10-CM | POA: Diagnosis not present

## 2014-05-28 DIAGNOSIS — F0390 Unspecified dementia without behavioral disturbance: Secondary | ICD-10-CM | POA: Diagnosis not present

## 2014-05-28 DIAGNOSIS — K8020 Calculus of gallbladder without cholecystitis without obstruction: Secondary | ICD-10-CM | POA: Diagnosis not present

## 2014-05-28 DIAGNOSIS — Z803 Family history of malignant neoplasm of breast: Secondary | ICD-10-CM | POA: Diagnosis not present

## 2014-05-28 DIAGNOSIS — K819 Cholecystitis, unspecified: Secondary | ICD-10-CM | POA: Diagnosis not present

## 2014-05-28 DIAGNOSIS — E780 Pure hypercholesterolemia: Secondary | ICD-10-CM | POA: Diagnosis not present

## 2014-05-28 DIAGNOSIS — K8000 Calculus of gallbladder with acute cholecystitis without obstruction: Secondary | ICD-10-CM | POA: Diagnosis not present

## 2014-07-15 ENCOUNTER — Other Ambulatory Visit: Payer: Self-pay | Admitting: Internal Medicine

## 2014-10-24 ENCOUNTER — Other Ambulatory Visit: Payer: Self-pay | Admitting: Internal Medicine

## 2014-10-24 DIAGNOSIS — E785 Hyperlipidemia, unspecified: Secondary | ICD-10-CM

## 2014-10-24 DIAGNOSIS — Z13820 Encounter for screening for osteoporosis: Secondary | ICD-10-CM

## 2014-10-29 ENCOUNTER — Other Ambulatory Visit: Payer: Medicare HMO

## 2014-10-29 ENCOUNTER — Other Ambulatory Visit (INDEPENDENT_AMBULATORY_CARE_PROVIDER_SITE_OTHER): Payer: Commercial Managed Care - HMO

## 2014-10-29 DIAGNOSIS — Z13820 Encounter for screening for osteoporosis: Secondary | ICD-10-CM | POA: Diagnosis not present

## 2014-10-29 DIAGNOSIS — E785 Hyperlipidemia, unspecified: Secondary | ICD-10-CM

## 2014-10-29 LAB — COMPREHENSIVE METABOLIC PANEL
ALBUMIN: 4.1 g/dL (ref 3.5–5.2)
ALK PHOS: 96 U/L (ref 39–117)
ALT: 9 U/L (ref 0–35)
AST: 17 U/L (ref 0–37)
BILIRUBIN TOTAL: 0.7 mg/dL (ref 0.2–1.2)
BUN: 19 mg/dL (ref 6–23)
CO2: 31 mEq/L (ref 19–32)
Calcium: 9.3 mg/dL (ref 8.4–10.5)
Chloride: 104 mEq/L (ref 96–112)
Creatinine, Ser: 0.85 mg/dL (ref 0.40–1.20)
GFR: 69.14 mL/min (ref >60.00)
GLUCOSE: 96 mg/dL (ref 70–99)
Potassium: 4.1 mEq/L (ref 3.5–5.1)
SODIUM: 141 meq/L (ref 135–145)
Total Protein: 7.1 g/dL (ref 6.0–8.3)

## 2014-10-29 LAB — CBC
HEMATOCRIT: 41.9 % (ref 36.0–46.0)
HEMOGLOBIN: 13.7 g/dL (ref 12.0–15.0)
MCHC: 32.7 g/dL (ref 30.0–36.0)
MCV: 84.2 fl (ref 78.0–100.0)
Platelets: 309 10*3/uL (ref 150.0–400.0)
RBC: 4.98 Mil/uL (ref 3.87–5.11)
RDW: 14.6 % (ref 11.5–15.5)
WBC: 7.7 10*3/uL (ref 4.0–10.5)

## 2014-10-29 LAB — LIPID PANEL
CHOLESTEROL: 198 mg/dL (ref 0–200)
HDL: 42.7 mg/dL (ref >39.00)
LDL Cholesterol: 117 mg/dL — ABNORMAL HIGH (ref 0–99)
NONHDL: 155.21
Total CHOL/HDL Ratio: 5
Triglycerides: 191 mg/dL — ABNORMAL HIGH (ref 0.0–149.0)
VLDL: 38.2 mg/dL (ref 0.0–40.0)

## 2014-10-30 LAB — VITAMIN D 25 HYDROXY (VIT D DEFICIENCY, FRACTURES): VITD: 24.48 ng/mL — ABNORMAL LOW (ref 30.00–100.00)

## 2014-10-31 ENCOUNTER — Encounter: Payer: Self-pay | Admitting: Internal Medicine

## 2014-10-31 ENCOUNTER — Ambulatory Visit (INDEPENDENT_AMBULATORY_CARE_PROVIDER_SITE_OTHER): Payer: Commercial Managed Care - HMO | Admitting: Internal Medicine

## 2014-10-31 VITALS — BP 146/82 | HR 86 | Temp 98.5°F | Ht 69.33 in | Wt 187.0 lb

## 2014-10-31 DIAGNOSIS — Z0000 Encounter for general adult medical examination without abnormal findings: Secondary | ICD-10-CM

## 2014-10-31 DIAGNOSIS — Z780 Asymptomatic menopausal state: Secondary | ICD-10-CM | POA: Diagnosis not present

## 2014-10-31 DIAGNOSIS — E785 Hyperlipidemia, unspecified: Secondary | ICD-10-CM

## 2014-10-31 DIAGNOSIS — I10 Essential (primary) hypertension: Secondary | ICD-10-CM

## 2014-10-31 DIAGNOSIS — R413 Other amnesia: Secondary | ICD-10-CM

## 2014-10-31 DIAGNOSIS — K219 Gastro-esophageal reflux disease without esophagitis: Secondary | ICD-10-CM

## 2014-10-31 DIAGNOSIS — F329 Major depressive disorder, single episode, unspecified: Secondary | ICD-10-CM

## 2014-10-31 DIAGNOSIS — F32A Depression, unspecified: Secondary | ICD-10-CM

## 2014-10-31 DIAGNOSIS — M8580 Other specified disorders of bone density and structure, unspecified site: Secondary | ICD-10-CM

## 2014-10-31 MED ORDER — MEMANTINE HCL 5 MG PO TABS: 5.0000 mg | ORAL_TABLET | Freq: Two times a day (BID) | ORAL | Status: DC

## 2014-10-31 NOTE — Assessment & Plan Note (Signed)
No issues on current Zoloft Will check CMET today

## 2014-10-31 NOTE — Patient Instructions (Signed)

## 2014-10-31 NOTE — Progress Notes (Signed)
HPI:  Pt presents to the clinic today for her Medicare Wellness Exam. She is also due for 6 month follow up of chronic conditions.  Depression: Mood stable on current dose of Zoloft. Her daughter reports she has a few days every few months where she gets a little down but overall is doing well.  HTN: Not medicated. BP today is 146/82.  GERD: She denies breakthrough reflux. She is taking Prilosec daily.  HLD: Denies myalgias on Zocor. Last lipid profile reviewed. LDL 121.  Memory Impairment: Her daughter reports worsening short term memory. She is taking Aricept daily.  Osteopenia: Bone density was ordered 10/2013 but not completed.  Past Medical History  Diagnosis Date  . Hypertension   . Hyperlipidemia   . GERD (gastroesophageal reflux disease)   . Depression     Current Outpatient Prescriptions  Medication Sig Dispense Refill  . aspirin 81 MG tablet Take 81 mg by mouth daily.    Marland Kitchen donepezil (ARICEPT) 10 MG tablet Take 1 tablet (10 mg total) by mouth at bedtime. 90 tablet 1  . loratadine (CLARITIN) 10 MG tablet Take 1 tablet by mouth daily as needed.    Marland Kitchen omeprazole (PRILOSEC) 40 MG capsule Take 1 capsule (40 mg total) by mouth daily. 90 capsule 1  . sertraline (ZOLOFT) 50 MG tablet Take 1 tablet (50 mg total) by mouth daily. 90 tablet 1  . simvastatin (ZOCOR) 40 MG tablet TAKE 1 TABLET EVERY DAY (REPLACES SIMVASTATIN 20MG ) 90 tablet 1  . vitamin E 400 UNIT capsule Take 400 Units by mouth daily.     No current facility-administered medications for this visit.    Allergies  Allergen Reactions  . Lisinopril Cough    Family History  Problem Relation Age of Onset  . Heart disease Mother   . Hypertension Mother   . Arthritis Father   . Cancer Father     Prostate  . Cancer Sister     Breast  . Cancer Brother     Throat  . Hypertension Daughter   . Cancer Son     Lung  . Diabetes Paternal Aunt   . Diabetes Paternal Uncle   . Diabetes Paternal Grandfather   . Cancer  Sister     Breast  . Cancer Sister     Breast  . Cancer Brother     Prostate  . Diabetes Daughter     Uterine  . Stroke Neg Hx     Social History   Social History  . Marital Status: Widowed    Spouse Name: N/A  . Number of Children: N/A  . Years of Education: N/A   Occupational History  . Not on file.   Social History Main Topics  . Smoking status: Never Smoker   . Smokeless tobacco: Never Used  . Alcohol Use: No  . Drug Use: No  . Sexual Activity: No   Other Topics Concern  . Not on file   Social History Narrative    Hospitiliaztions: She had a cholecystectomy in [place], 05/2014  Health Maintenance:    Flu: 04/2014  Tetanus: unsure  Pneumovax: She thinks she had this in 2014  Prevnar: 07/2013  Zostavax: never, did have chicken pox  Bone Density: unsure  Colonoscopy: She thinks it has been  <5 years  Eye Doctor: 08/2013 at [place] Exam: no, dentures  Mammogram: Unsure of her last one, did have it at [facility]  Pap: no longer screening    Providers:  PCP: Webb Silversmith, NP-C  Podiatrist: Dr. Milinda Pointer    I have personally reviewed and have noted:  1. The patient's medical and social history 2. Their use of alcohol, tobacco or illicit drugs 3. Their current medications and supplements 4. The patient's functional ability including ADL's, fall risks, home safety  risks and hearing or visual impairment. 5. Diet and physical activities 6. Evidence for depression or mood disorder  Subjective:   Review of Systems:   Constitutional: Denies fever, malaise, fatigue, headache or abrupt weight changes.  HEENT: Pt reports runny nose. Denies eye pain, eye redness, ear pain, ringing in the ears, wax buildup, nasal congestion, bloody nose, or sore throat. Respiratory: Denies difficulty breathing, shortness of breath, cough or sputum production.   Cardiovascular: Denies chest pain, chest tightness, palpitations or swelling in the hands or feet.   Gastrointestinal: Denies abdominal pain, bloating, constipation, diarrhea or blood in the stool.  GU: Denies urgency, frequency, pain with urination, burning sensation, blood in urine, odor or discharge. Musculoskeletal: Denies decrease in range of motion, difficulty with gait, muscle pain or joint pain and swelling.  Skin: Denies redness, rashes, lesions or ulcercations.  Neurological: Denies dizziness, difficulty with memory, difficulty with speech or problems with balance and coordination.  Psych: Pt has history of depression.Denies anxiety, SI/HI.  No other specific complaints in a complete review of systems (except as listed in HPI above).  Objective:  PE:   BP 146/82 mmHg  Pulse 86  Temp(Src) 98.5 F (36.9 C) (Oral)  Ht 5' 9.33" (1.761 m)  Wt 187 lb (84.823 kg)  BMI 27.35 kg/m2  SpO2 97%  Wt Readings from Last 3 Encounters:  05/02/14 177 lb (80.287 kg)  11/22/13 175 lb (79.379 kg)  10/25/13 172 lb (78.019 kg)    General: Appears her stated age, well developed, well nourished in NAD. Skin: Warm, dry and intact. No rashes, lesions or ulcerations noted. HEENT: Head: normal shape and size; Eyes: sclera white, no icterus, conjunctiva pink, PERRLA and EOMs intact; Ears: Tm's gray and intact, normal light reflex; Nose: mucosa pink and moist, septum midline; Throat/Mouth: Teeth present, mucosa pink and moist, no exudate, lesions or ulcerations noted.  Neck: Neck supple, trachea midline. No masses, lumps or thyromegaly present.  Cardiovascular: Normal rate and rhythm. S1,S2 noted.  No murmur, rubs or gallops noted. No JVD or BLE edema. No carotid bruits noted. Pulmonary/Chest: Normal effort and positive vesicular breath sounds. No respiratory distress. No wheezes, rales or ronchi noted.  Abdomen: Soft and nontender. Normal bowel sounds, no bruits noted. No distention or masses noted. Liver, spleen and kidneys non palpable. Neurological: Alert and oriented. She has noted short term  memory loss. Coordination normal.  Psychiatric: She engages. She is happy, and laughing. She makes good eye contact.    BMET    Component Value Date/Time   NA 141 10/29/2014 1430   K 4.1 10/29/2014 1430   CL 104 10/29/2014 1430   CO2 31 10/29/2014 1430   GLUCOSE 96 10/29/2014 1430   BUN 19 10/29/2014 1430   CREATININE 0.85 10/29/2014 1430   CALCIUM 9.3 10/29/2014 1430    Lipid Panel     Component Value Date/Time   CHOL 198 10/29/2014 1430   TRIG 191.0* 10/29/2014 1430   HDL 42.70 10/29/2014 1430   CHOLHDL 5 10/29/2014 1430   VLDL 38.2 10/29/2014 1430   LDLCALC 117* 10/29/2014 1430    CBC    Component Value Date/Time   WBC 7.7 10/29/2014 1430  RBC 4.98 10/29/2014 1430   HGB 13.7 10/29/2014 1430   HCT 41.9 10/29/2014 1430   PLT 309.0 10/29/2014 1430   MCV 84.2 10/29/2014 1430   MCHC 32.7 10/29/2014 1430   RDW 14.6 10/29/2014 1430    Hgb A1C No results found for: HGBA1C    Assessment and Plan:   Medicare Annual Wellness Visit:  Diet: She does not follow any particular diet Physical activity: Sedentary Depression/mood screen: Negative, on medication Hearing: Intact to whispered voice Visual acuity: Grossly normal.  ADLs: Capable Fall risk: Moderate  Home safety: Good Cognitive evaluation: Intact to orientation, naming, and repetition. She has trouble with recall EOL planning: No adv directives, full code/ I agree  Preventative Medicine: She does not want Tetanus, Pneumovax, Zostovax, Mammogram, Bone Density, Pap Smear, repeat Colonoscopy. Encouraged her to make an annual eye appt. Dentist not indicated.   Next appointment: 6 months

## 2014-10-31 NOTE — Assessment & Plan Note (Signed)
Controlled on Prilosec Will check CMET today

## 2014-10-31 NOTE — Assessment & Plan Note (Signed)
She is not interested in scheduling her bone density exam Will check Vit D today

## 2014-10-31 NOTE — Progress Notes (Signed)
Pre visit review using our clinic review tool, if applicable. No additional management support is needed unless otherwise documented below in the visit note. 

## 2014-10-31 NOTE — Assessment & Plan Note (Addendum)
Continue Aricept Will start Namenda

## 2014-10-31 NOTE — Assessment & Plan Note (Signed)
At goal given her age Will check CBC and CMET today

## 2014-10-31 NOTE — Assessment & Plan Note (Signed)
No issues on Zocor Will check Lipid Profile and CMET today Encouraged her to consume a low fat diet

## 2014-11-02 ENCOUNTER — Other Ambulatory Visit: Payer: Self-pay | Admitting: Internal Medicine

## 2014-12-12 ENCOUNTER — Other Ambulatory Visit: Payer: Self-pay | Admitting: Internal Medicine

## 2015-04-01 ENCOUNTER — Other Ambulatory Visit: Payer: Self-pay | Admitting: Internal Medicine

## 2015-05-08 ENCOUNTER — Ambulatory Visit (INDEPENDENT_AMBULATORY_CARE_PROVIDER_SITE_OTHER): Payer: Commercial Managed Care - HMO | Admitting: Internal Medicine

## 2015-05-08 ENCOUNTER — Encounter: Payer: Self-pay | Admitting: Internal Medicine

## 2015-05-08 VITALS — BP 142/90 | HR 65 | Temp 98.9°F | Wt 186.0 lb

## 2015-05-08 DIAGNOSIS — I10 Essential (primary) hypertension: Secondary | ICD-10-CM | POA: Diagnosis not present

## 2015-05-08 DIAGNOSIS — F329 Major depressive disorder, single episode, unspecified: Secondary | ICD-10-CM

## 2015-05-08 DIAGNOSIS — R413 Other amnesia: Secondary | ICD-10-CM

## 2015-05-08 DIAGNOSIS — K219 Gastro-esophageal reflux disease without esophagitis: Secondary | ICD-10-CM | POA: Diagnosis not present

## 2015-05-08 DIAGNOSIS — M8580 Other specified disorders of bone density and structure, unspecified site: Secondary | ICD-10-CM | POA: Diagnosis not present

## 2015-05-08 DIAGNOSIS — E785 Hyperlipidemia, unspecified: Secondary | ICD-10-CM

## 2015-05-08 DIAGNOSIS — F32A Depression, unspecified: Secondary | ICD-10-CM

## 2015-05-08 MED ORDER — OMEPRAZOLE 40 MG PO CPDR: 40.0000 mg | DELAYED_RELEASE_CAPSULE | Freq: Two times a day (BID) | ORAL | Status: DC

## 2015-05-08 NOTE — Patient Instructions (Signed)
Food Choices for Gastroesophageal Reflux Disease, Adult When you have gastroesophageal reflux disease (GERD), the foods you eat and your eating habits are very important. Choosing the right foods can help ease the discomfort of GERD. WHAT GENERAL GUIDELINES DO I NEED TO FOLLOW?  Choose fruits, vegetables, whole grains, low-fat dairy products, and low-fat meat, fish, and poultry.  Limit fats such as oils, salad dressings, butter, nuts, and avocado.  Keep a food diary to identify foods that cause symptoms.  Avoid foods that cause reflux. These may be different for different people.  Eat frequent small meals instead of three large meals each day.  Eat your meals slowly, in a relaxed setting.  Limit fried foods.  Cook foods using methods other than frying.  Avoid drinking alcohol.  Avoid drinking large amounts of liquids with your meals.  Avoid bending over or lying down until 2-3 hours after eating. WHAT FOODS ARE NOT RECOMMENDED? The following are some foods and drinks that may worsen your symptoms: Vegetables Tomatoes. Tomato juice. Tomato and spaghetti sauce. Chili peppers. Onion and garlic. Horseradish. Fruits Oranges, grapefruit, and lemon (fruit and juice). Meats High-fat meats, fish, and poultry. This includes hot dogs, ribs, ham, sausage, salami, and bacon. Dairy Whole milk and chocolate milk. Sour cream. Cream. Butter. Ice cream. Cream cheese.  Beverages Coffee and tea, with or without caffeine. Carbonated beverages or energy drinks. Condiments Hot sauce. Barbecue sauce.  Sweets/Desserts Chocolate and cocoa. Donuts. Peppermint and spearmint. Fats and Oils High-fat foods, including French fries and potato chips. Other Vinegar. Strong spices, such as black pepper, white pepper, red pepper, cayenne, curry powder, cloves, ginger, and chili powder. The items listed above may not be a complete list of foods and beverages to avoid. Contact your dietitian for more  information.   This information is not intended to replace advice given to you by your health care provider. Make sure you discuss any questions you have with your health care provider.   Document Released: 03/02/2005 Document Revised: 03/23/2014 Document Reviewed: 01/04/2013 [employer] Interactive Patient Education 2016 [employer]  

## 2015-05-08 NOTE — Progress Notes (Signed)
HPI:  Pt presents to the clinic today for 6 month follow up of chronic conditions.  Depression: Mood stable on current dose of Zoloft.  HTN: Not medicated. BP today is 142/90.  GERD: Her daughter reports she occasionally has breakthrough symptoms on Prilosec. She does not take anything additional OTC.  HLD: Denies myalgias on Zocor. Last lipid profile reviewed. LDL 117 10/2014. She is taking a baby ASA daily.  Memory Impairment: She is now on Aricept and Namenda. Her daughter does not feel like it is helping and request referral to neurology for further evaluation.  Osteopenia: Bone density was ordered 10/2013 but not completed.  Past Medical History  Diagnosis Date  . Hypertension   . Hyperlipidemia   . GERD (gastroesophageal reflux disease)   . Depression     Current Outpatient Prescriptions  Medication Sig Dispense Refill  . aspirin 81 MG tablet Take 81 mg by mouth daily.    Marland Kitchen donepezil (ARICEPT) 10 MG tablet TAKE 1 TABLET AT BEDTIME 90 tablet 2  . Melatonin 5 MG TABS Take 1 tablet by mouth at bedtime and may repeat dose one time if needed.    . memantine (NAMENDA) 5 MG tablet TAKE 1 TABLET TWICE DAILY 180 tablet 1  . omeprazole (PRILOSEC) 40 MG capsule TAKE 1 CAPSULE EVERY DAY 90 capsule 2  . sertraline (ZOLOFT) 50 MG tablet TAKE 1 TABLET EVERY DAY 90 tablet 0  . simvastatin (ZOCOR) 40 MG tablet TAKE 1 TABLET EVERY DAY (REPLACES SIMVASTATIN 20MG ) 90 tablet 2  . vitamin E 400 UNIT capsule Take 400 Units by mouth daily.     No current facility-administered medications for this visit.    Allergies  Allergen Reactions  . Lisinopril Cough    Family History  Problem Relation Age of Onset  . Heart disease Mother   . Hypertension Mother   . Arthritis Father   . Cancer Father     Prostate  . Cancer Sister     Breast  . Cancer Brother     Throat  . Hypertension Daughter   . Cancer Son     Lung  . Diabetes Paternal Aunt   . Diabetes Paternal Uncle   . Diabetes  Paternal Grandfather   . Cancer Sister     Breast  . Cancer Sister     Breast  . Cancer Brother     Prostate  . Diabetes Daughter     Uterine  . Stroke Neg Hx     Social History   Social History  . Marital Status: Widowed    Spouse Name: N/A  . Number of Children: N/A  . Years of Education: N/A   Occupational History  . Not on file.   Social History Main Topics  . Smoking status: Never Smoker   . Smokeless tobacco: Never Used  . Alcohol Use: No  . Drug Use: No  . Sexual Activity: No   Other Topics Concern  . Not on file   Social History Narrative    Subjective:   Review of Systems:   Constitutional: Denies fever, malaise, fatigue, headache or abrupt weight changes.  HEENT: Pt reports runny nose. Denies eye pain, eye redness, ear pain, ringing in the ears, wax buildup, nasal congestion, bloody nose, or sore throat. Respiratory: Denies difficulty breathing, shortness of breath, cough or sputum production.   Cardiovascular: Denies chest pain, chest tightness, palpitations or swelling in the hands or feet.  Gastrointestinal: Pt reports reflux. Denies abdominal pain, bloating, constipation, diarrhea  or blood in the stool.  GU: Denies urgency, frequency, pain with urination, burning sensation, blood in urine, odor or discharge. Musculoskeletal: Denies decrease in range of motion, difficulty with gait, muscle pain or joint pain and swelling.  Skin: Denies redness, rashes, lesions or ulcercations.  Neurological: Pt reports difficulty with memory. Denies dizziness, difficulty with speech or problems with balance and coordination.  Psych: Pt has history of depression.Denies anxiety, SI/HI.  No other specific complaints in a complete review of systems (except as listed in HPI above).  Objective:  PE:   BP 142/90 mmHg  Pulse 65  Temp(Src) 98.9 F (37.2 C) (Oral)  Wt 186 lb (84.369 kg)  SpO2 98%   Wt Readings from Last 3 Encounters:  05/08/15 186 lb (84.369 kg)   10/31/14 187 lb (84.823 kg)  05/02/14 177 lb (80.287 kg)    General: Appears her stated age, well developed, well nourished in NAD. HEENT: Head: normal shape and size; Eyes: sclera white, no icterus, conjunctiva pink, PERRLA and EOMs intact;  Nose: mucosa pink and moist, septum midline;t.  Cardiovascular: Normal rate and rhythm. S1,S2 noted.  No murmur, rubs or gallops noted. No JVD or BLE edema. No carotid bruits noted. Pulmonary/Chest: Normal effort and positive vesicular breath sounds. No respiratory distress. No wheezes, rales or ronchi noted.  Abdomen: Soft and nontender. Normal bowel sounds. Neurological: Alert and oriented. She has noted short term memory loss. Coordination normal.  Psychiatric: She engages. She is happy, and laughing. She makes good eye contact.    BMET    Component Value Date/Time   NA 141 10/29/2014 1430   K 4.1 10/29/2014 1430   CL 104 10/29/2014 1430   CO2 31 10/29/2014 1430   GLUCOSE 96 10/29/2014 1430   BUN 19 10/29/2014 1430   CREATININE 0.85 10/29/2014 1430   CALCIUM 9.3 10/29/2014 1430    Lipid Panel     Component Value Date/Time   CHOL 198 10/29/2014 1430   TRIG 191.0* 10/29/2014 1430   HDL 42.70 10/29/2014 1430   CHOLHDL 5 10/29/2014 1430   VLDL 38.2 10/29/2014 1430   LDLCALC 117* 10/29/2014 1430    CBC    Component Value Date/Time   WBC 7.7 10/29/2014 1430   RBC 4.98 10/29/2014 1430   HGB 13.7 10/29/2014 1430   HCT 41.9 10/29/2014 1430   PLT 309.0 10/29/2014 1430   MCV 84.2 10/29/2014 1430   MCHC 32.7 10/29/2014 1430   RDW 14.6 10/29/2014 1430    Hgb A1C No results found for: HGBA1C    Assessment and Plan:

## 2015-05-08 NOTE — Assessment & Plan Note (Signed)
Deteriorated Continue Aricept and Namenda Will refer to neurology for further evaluation

## 2015-05-08 NOTE — Assessment & Plan Note (Signed)
Controlled off meds Will continue to monitor 

## 2015-05-08 NOTE — Assessment & Plan Note (Signed)
Will increase Prilosec to 40 mg BID for a while to see if this helps Handout given on diet for GERD

## 2015-05-08 NOTE — Assessment & Plan Note (Signed)
LDL at goal on Zocor Encouraged her to consume a low fat diet Continue baby ASA daily

## 2015-05-08 NOTE — Progress Notes (Signed)
Pre visit review using our clinic review tool, if applicable. No additional management support is needed unless otherwise documented below in the visit note. 

## 2015-05-08 NOTE — Assessment & Plan Note (Signed)
Stable on current dose of Zoloft No changes needed, will continue to monitor

## 2015-05-08 NOTE — Assessment & Plan Note (Signed)
She is taking Vit D OTC She wants to hold off on bone density exam at this time

## 2015-05-14 ENCOUNTER — Emergency Department
Admission: EM | Admit: 2015-05-14 | Discharge: 2015-05-14 | Disposition: A | Discharge status: Home / Self Care | Payer: Commercial Managed Care - HMO | Attending: Emergency Medicine | Admitting: Emergency Medicine

## 2015-05-14 ENCOUNTER — Emergency Department: Payer: Commercial Managed Care - HMO

## 2015-05-14 ENCOUNTER — Encounter: Payer: Self-pay | Admitting: Emergency Medicine

## 2015-05-14 DIAGNOSIS — S0993XA Unspecified injury of face, initial encounter: Secondary | ICD-10-CM | POA: Insufficient documentation

## 2015-05-14 DIAGNOSIS — Y9389 Activity, other specified: Secondary | ICD-10-CM | POA: Diagnosis not present

## 2015-05-14 DIAGNOSIS — Z79899 Other long term (current) drug therapy: Secondary | ICD-10-CM | POA: Insufficient documentation

## 2015-05-14 DIAGNOSIS — S52591A Other fractures of lower end of right radius, initial encounter for closed fracture: Secondary | ICD-10-CM | POA: Diagnosis not present

## 2015-05-14 DIAGNOSIS — W19XXXA Unspecified fall, initial encounter: Secondary | ICD-10-CM

## 2015-05-14 DIAGNOSIS — S0990XA Unspecified injury of head, initial encounter: Secondary | ICD-10-CM | POA: Insufficient documentation

## 2015-05-14 DIAGNOSIS — I10 Essential (primary) hypertension: Secondary | ICD-10-CM | POA: Insufficient documentation

## 2015-05-14 DIAGNOSIS — S6991XA Unspecified injury of right wrist, hand and finger(s), initial encounter: Secondary | ICD-10-CM | POA: Diagnosis present

## 2015-05-14 DIAGNOSIS — S52601A Unspecified fracture of lower end of right ulna, initial encounter for closed fracture: Secondary | ICD-10-CM | POA: Diagnosis not present

## 2015-05-14 DIAGNOSIS — R51 Headache: Secondary | ICD-10-CM | POA: Diagnosis not present

## 2015-05-14 DIAGNOSIS — S0083XA Contusion of other part of head, initial encounter: Secondary | ICD-10-CM | POA: Diagnosis not present

## 2015-05-14 DIAGNOSIS — S52501A Unspecified fracture of the lower end of right radius, initial encounter for closed fracture: Secondary | ICD-10-CM

## 2015-05-14 DIAGNOSIS — G3183 Dementia with Lewy bodies: Secondary | ICD-10-CM | POA: Diagnosis not present

## 2015-05-14 DIAGNOSIS — M25561 Pain in right knee: Secondary | ICD-10-CM | POA: Diagnosis not present

## 2015-05-14 DIAGNOSIS — Y998 Other external cause status: Secondary | ICD-10-CM | POA: Insufficient documentation

## 2015-05-14 DIAGNOSIS — S52201A Unspecified fracture of shaft of right ulna, initial encounter for closed fracture: Secondary | ICD-10-CM

## 2015-05-14 DIAGNOSIS — Z7982 Long term (current) use of aspirin: Secondary | ICD-10-CM | POA: Insufficient documentation

## 2015-05-14 DIAGNOSIS — T07XXXA Unspecified multiple injuries, initial encounter: Secondary | ICD-10-CM

## 2015-05-14 DIAGNOSIS — Y9289 Other specified places as the place of occurrence of the external cause: Secondary | ICD-10-CM | POA: Diagnosis not present

## 2015-05-14 DIAGNOSIS — W01198A Fall on same level from slipping, tripping and stumbling with subsequent striking against other object, initial encounter: Secondary | ICD-10-CM | POA: Insufficient documentation

## 2015-05-14 DIAGNOSIS — M542 Cervicalgia: Secondary | ICD-10-CM | POA: Diagnosis not present

## 2015-05-14 DIAGNOSIS — S80211A Abrasion, right knee, initial encounter: Secondary | ICD-10-CM | POA: Insufficient documentation

## 2015-05-14 DIAGNOSIS — S52691A Other fracture of lower end of right ulna, initial encounter for closed fracture: Secondary | ICD-10-CM | POA: Insufficient documentation

## 2015-05-14 HISTORY — DX: Dementia in other diseases classified elsewhere, unspecified severity, without behavioral disturbance, psychotic disturbance, mood disturbance, and anxiety: F02.80

## 2015-05-14 HISTORY — DX: Alzheimer's disease, unspecified: G30.9

## 2015-05-14 MED ORDER — OXYCODONE-ACETAMINOPHEN 5-325 MG PO TABS: 1.0000 | ORAL_TABLET | Freq: Four times a day (QID) | ORAL | Status: DC | PRN

## 2015-05-14 NOTE — Discharge Instructions (Signed)
Please keep your arm elevated above your heart as much as possible. You may put ice over the top of the splint for 20 minutes every 3 hours to decrease swelling. Please make an appointment with Dr. Marry Guan, the orthopedic surgeon, to follow-up your fracture.  Return to the emergency department if he develops severe pain, headache, nausea or vomiting, neck pain, numbness tingling or weakness, or any other symptoms concerning to you.  You may take Tylenol or Motrin for mild to moderate pain. Please take Percocet for severe pain. Do not drive until you have been cleared by the surgeon and are no longer taking Percocet.

## 2015-05-14 NOTE — ED Notes (Signed)
Patient states that she missed a step and fell on concrete. Patient with hematoma and abrasion to right side of forehead. Pain to right wrist and knee. Patient denies LOC. Patient takes 81mg  asa daily.

## 2015-05-14 NOTE — ED Provider Notes (Signed)
[facility] Emergency Department Provider Note  ____________________________________________  Time seen: Approximately 10:22 PM  I have reviewed the triage vital signs and the nursing notes.   HISTORY  Chief Complaint Fall; Wrist Pain; Knee Pain; and Head Injury    HPI Teresa Francis is a 75 y.o. female with a history of ulcers dementia, HTN and HL presenting with a fall. Patient reports that she missed a step and fell down, striking her head on the concrete floor. She denies any loss of consciousness or neck pain. She denies any associated chest pain, shortness of breath, palpitations. She has not recently had any illness. She has pain over her right forehead, right wrist, and an abrasion over her right knee. At this time, she does not have a headache, nausea or vomiting, or visual changes.   Past Medical History  Diagnosis Date  . Hypertension   . Hyperlipidemia   . GERD (gastroesophageal reflux disease)   . Depression   . Alzheimer disease     Patient Active Problem List   Diagnosis Date Noted  . Osteopenia 10/25/2013  . Gastroesophageal reflux disease without esophagitis 09/20/2013  . HLD (hyperlipidemia) 09/20/2013  . Essential hypertension 09/20/2013  . Memory impairment 09/20/2013  . Depression 09/20/2013    Past Surgical History  Procedure Laterality Date  . Tubal ligation  1978  . Cholecystectomy  2016    Current Outpatient Rx  Name  Route  Sig  Dispense  Refill  . aspirin 81 MG tablet   Oral   Take 81 mg by mouth daily.         Marland Kitchen donepezil (ARICEPT) 10 MG tablet      TAKE 1 TABLET AT BEDTIME   90 tablet   2   . Melatonin 5 MG TABS   Oral   Take 1 tablet by mouth at bedtime and may repeat dose one time if needed.         . memantine (NAMENDA) 5 MG tablet      TAKE 1 TABLET TWICE DAILY   180 tablet   1   . omeprazole (PRILOSEC) 40 MG capsule   Oral   Take 1 capsule (40 mg total) by mouth 2 (two) times daily.    180 capsule   1   . sertraline (ZOLOFT) 50 MG tablet      TAKE 1 TABLET EVERY DAY   90 tablet   0   . simvastatin (ZOCOR) 40 MG tablet      TAKE 1 TABLET EVERY DAY (REPLACES SIMVASTATIN 20MG )   90 tablet   2   . vitamin E 400 UNIT capsule   Oral   Take 400 Units by mouth daily.           Allergies Lisinopril  Family History  Problem Relation Age of Onset  . Heart disease Mother   . Hypertension Mother   . Arthritis Father   . Cancer Father     Prostate  . Cancer Sister     Breast  . Cancer Brother     Throat  . Hypertension Daughter   . Cancer Son     Lung  . Diabetes Paternal Aunt   . Diabetes Paternal Uncle   . Diabetes Paternal Grandfather   . Cancer Sister     Breast  . Cancer Sister     Breast  . Cancer Brother     Prostate  . Diabetes Daughter     Uterine  . Stroke Neg  Hx     Social History Social History  Substance Use Topics  . Smoking status: Never Smoker   . Smokeless tobacco: Never Used  . Alcohol Use: No    Review of Systems Constitutional: No fever/chills no lightheadedness. No syncope. No loss of consciousness. Positive fall. Eyes: No visual changes. No blurred or double vision. ENT: No sore throat. Cardiovascular: Denies chest pain, palpitations. Respiratory: Denies shortness of breath.  No cough. Gastrointestinal: No abdominal pain.  No nausea, no vomiting.  No diarrhea.  No constipation. Genitourinary: Negative for dysuria. Musculoskeletal: Negative for back pain. No neck pain. No right knee pain although she has swelling and bruising. Skin: Negative for rash. Positive abrasion to right forehead and right knee. Neurological: Negative for headaches, focal weakness or numbness.  10-point ROS otherwise negative.  ____________________________________________   PHYSICAL EXAM:  VITAL SIGNS: ED Triage Vitals  Enc Vitals Group     BP 05/14/15 1937 185/68 mmHg     Pulse Rate 05/14/15 1937 87     Resp 05/14/15 1937 18      Temp 05/14/15 1937 98.5 F (36.9 C)     Temp Source 05/14/15 1937 Oral     SpO2 05/14/15 1937 96 %     Weight 05/14/15 1937 180 lb (81.647 kg)     Height 05/14/15 1937 5\' 9"  (1.753 m)     Head Cir --      Peak Flow --      Pain Score 05/14/15 1944 6     Pain Loc --      Pain Edu? --      Excl. in [place]? --     Constitutional: Patient is alert and oriented and answering questions appropriately.  Eyes: Conjunctivae are normal.  EOMI. no raccoon eyes. PERRLA. Head: Large 5 x 5" swelling and ecchymosis over the right forehead with overlying abrasion. No palpable instability of the skull. No midface instability.  Nose: No congestion/rhinnorhea. No septal hematoma. Mouth/Throat: Mucous membranes are moist. Dental injury or malocclusion. Neck: No stridor.  Supple.  No midline C-spine tenderness, step-offs or deformities. Full range of motion without pain. Cardiovascular: Normal rate, regular rhythm. No murmurs, rubs or gallops.  Respiratory: Normal respiratory effort.  No retractions. Lungs CTAB.  No wheezes, rales or ronchi. Gastrointestinal: Soft and nontender. No distention. No peritoneal signs. Musculoskeletal: Right wrist with obvious deformity and pain with movement. Normal right grip strength. Full range of motion of the right elbow and shoulder without pain. Full range of motion of the left wrist, shoulder, elbow without pain. Superficial abrasion that is 1 x 2 cm over the right knee without effusion. Full range of motion of the bilateral ankles knees and hips without pain. Normal bilateral radial pulses and bilateral DP and PT pulses. Normal sensation to light touch throughout all 4 extremities. Neurologic:  Normal speech and language. No gross focal neurologic deficits are appreciated.  Skin:  Skin is warm, dry  Psychiatric: Mood and affect are normal. Speech and behavior are normal.  Normal judgement.  ____________________________________________   LABS (all labs ordered are listed,  but only abnormal results are displayed)  Labs Reviewed - No data to display ____________________________________________  EKG  Not indicated ____________________________________________  RADIOLOGY  Dg Wrist Complete Right  05/14/2015  CLINICAL DATA:  75 year old female with fall and right wrist pain. EXAM: RIGHT WRIST - COMPLETE 3+ VIEW COMPARISON:  None. FINDINGS: There is a nondisplaced transverse fracture of the distal radius. There is also a nondisplaced fracture of  the ulnar-styloid. No definite other acute fracture identified. The bones are osteopenic. The soft tissues appear unremarkable. IMPRESSION: Nondisplaced transverse fracture of distal radius and ulnar styloid. Electronically Signed   By: Anner Crete M.D.   On: 05/14/2015 20:19   Ct Head Wo Contrast  05/14/2015  CLINICAL DATA:  Fall, right forehead trauma , head and neck pain EXAM: CT HEAD WITHOUT CONTRAST CT CERVICAL SPINE WITHOUT CONTRAST TECHNIQUE: Multidetector CT imaging of the head and cervical spine was performed following the standard protocol without intravenous contrast. Multiplanar CT image reconstructions of the cervical spine were also generated. COMPARISON:  None. FINDINGS: CT HEAD FINDINGS Diffuse brain atrophy and minor periventricular white matter microvascular ischemic changes. No acute intracranial hemorrhage, infarction, mass lesion, midline shift, herniation, hydrocephalus, or extra-axial fluid collection. Cisterns remain patent. Cerebellar atrophy as well. Orbits are symmetric. Right frontal orbital small scalp hematoma noted. No underlying skull fracture. Mastoids and sinuses remain clear. CT CERVICAL SPINE FINDINGS Normal cervical spine alignment. Multilevel diffuse degenerative change, spondylosis and facet arthropathy. Degenerative changes of the C1-2 articulation as well. No acute osseous finding or fracture. No subluxation or dislocation. Facets are aligned. Normal prevertebral soft tissues. Odontoid is  intact. No soft tissue asymmetry in the neck. No hematoma evident. Lung apices are clear. 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 IMPRESSION: Brain atrophy and chronic white matter microvascular ischemic changes. Right frontal region small scalp hematoma. Degenerative changes of the cervical spine as above. No acute osseous finding or fracture evident. 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 Electronically Signed   By: Jerilynn Mages.  Shick M.D.   On: 05/14/2015 20:33   Ct Cervical Spine Wo Contrast  05/14/2015  CLINICAL DATA:  Fall, right forehead trauma , head and neck pain EXAM: CT HEAD WITHOUT CONTRAST CT CERVICAL SPINE WITHOUT CONTRAST TECHNIQUE: Multidetector CT imaging of the head and cervical spine was performed following the standard protocol without intravenous contrast. Multiplanar CT image reconstructions of the cervical spine were also generated. COMPARISON:  None. FINDINGS: CT HEAD FINDINGS Diffuse brain atrophy and minor periventricular white matter microvascular ischemic changes. No acute intracranial hemorrhage, infarction, mass lesion, midline shift, herniation, hydrocephalus, or extra-axial fluid collection. Cisterns remain patent. Cerebellar atrophy as well. Orbits are symmetric. Right frontal orbital small scalp hematoma noted. No underlying skull fracture. Mastoids and sinuses remain clear. CT CERVICAL SPINE FINDINGS Normal cervical spine alignment. Multilevel diffuse degenerative change, spondylosis and facet arthropathy. Degenerative changes of the C1-2 articulation as well. No acute osseous finding or fracture. No subluxation or dislocation. Facets are aligned. Normal prevertebral soft tissues. Odontoid is intact. No soft tissue asymmetry in the neck. No hematoma evident. Lung apices are clear. 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 IMPRESSION: Brain atrophy and chronic white matter microvascular  ischemic changes. Right frontal region small scalp hematoma. Degenerative changes of the cervical spine as above. No acute osseous finding or fracture evident. 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 Electronically Signed   By: Jerilynn Mages.  Shick M.D.   On: 05/14/2015 20:33   Dg Knee Complete 4 Views Right  05/14/2015  CLINICAL DATA:  75 year old female with fall and right knee pain. EXAM: RIGHT KNEE - COMPLETE 4+ VIEW COMPARISON:  None. FINDINGS: There is no acute fracture or dislocation. The bones are osteopenic. There is mild osteoarthritic changes of the knee. No significant suprapatellar effusion. The soft tissues appear unremarkable. IMPRESSION: No acute fracture. Electronically Signed   By: Anner Crete M.D.   On: 05/14/2015 20:17  ____________________________________________   PROCEDURES  Procedure(s) performed: None  Critical Care performed: No ____________________________________________   INITIAL IMPRESSION / ASSESSMENT AND PLAN / ED COURSE  Pertinent labs & imaging results that were available during my care of the patient were reviewed by me and considered in my medical decision making (see chart for details).  75 y.o. female with a mechanical fall presenting with contusion to right forehead, right wrist deformity, and right knee abrasion. Will evaluate the patient for intracranial injury, or any extremity fractures.  ----------------------------------------- 11:47 PM on 05/14/2015 -----------------------------------------  The patient has a CT scan which does not show any acute intracranial injury. No spine injury. She does have a right transverse nondisplaced radius and ulnar styloid fracture. No evidence of fracture dislocation of the right knee. I have spoken with Dr. Bess Harvest of orthopedics who has recommended a sugar tong splint for her right wrist and he will follow her up in the clinic in one week. The patient's pain is well-controlled and I will  apply Neosporin to her abrasions. She has been given ice for all of her swelling. Plan discharge. Return precautions as well as. Instructions were discussed.  SPLINT APPLICATION Date/Time: [id number] PM Authorized by: Eula Listen Consent: Verbal consent obtained. Risks and benefits: risks, benefits and alternatives were discussed Consent given by: patient Splint applied by: orthopedic technician Location details: Right wrist  Splint type: Sugar tong  Supplies used: Ortho-Glass  Post-procedure: The splinted body part was neurovascularly unchanged following the procedure. Patient tolerance: Patient tolerated the procedure well with no immediate complications.    ____________________________________________  FINAL CLINICAL IMPRESSION(S) / ED DIAGNOSES  Final diagnoses:  Distal radius fracture, right, closed, initial encounter  Ulnar fracture, right, closed, initial encounter  Contusion of forehead, initial encounter  Abrasion, multiple sites  Fall, initial encounter      NEW MEDICATIONS STARTED DURING THIS VISIT:  New Prescriptions   No medications on file     Eula Listen, MD 05/14/15 2348

## 2015-05-15 ENCOUNTER — Telehealth: Payer: Self-pay | Admitting: Internal Medicine

## 2015-05-15 ENCOUNTER — Other Ambulatory Visit: Payer: Self-pay | Admitting: Internal Medicine

## 2015-05-15 DIAGNOSIS — S62101D Fracture of unspecified carpal bone, right wrist, subsequent encounter for fracture with routine healing: Secondary | ICD-10-CM

## 2015-05-15 NOTE — Telephone Encounter (Signed)
Patient's daughter said the patient fell last night and went to [facility] ER and they referred her to Dr. Skip Estimable at [place], so they will need a referral.

## 2015-05-15 NOTE — Telephone Encounter (Signed)
Pt's daughter states appt is not scheduled--she was advised to have PCP office do referral and schedule

## 2015-05-15 NOTE — Telephone Encounter (Signed)
Referral placed.

## 2015-05-15 NOTE — Telephone Encounter (Signed)
Is he an orthopedic doctor? And do they need [place] to set up the appt, or just place the referral for insurance purposes?

## 2015-05-21 DIAGNOSIS — M25531 Pain in right wrist: Secondary | ICD-10-CM | POA: Diagnosis not present

## 2015-05-21 DIAGNOSIS — S52501A Unspecified fracture of the lower end of right radius, initial encounter for closed fracture: Secondary | ICD-10-CM | POA: Diagnosis not present

## 2015-05-22 DIAGNOSIS — H524 Presbyopia: Secondary | ICD-10-CM | POA: Diagnosis not present

## 2015-05-22 DIAGNOSIS — H5210 Myopia, unspecified eye: Secondary | ICD-10-CM | POA: Diagnosis not present

## 2015-05-28 ENCOUNTER — Other Ambulatory Visit: Payer: Self-pay | Admitting: Internal Medicine

## 2015-05-30 DIAGNOSIS — S52501A Unspecified fracture of the lower end of right radius, initial encounter for closed fracture: Secondary | ICD-10-CM | POA: Diagnosis not present

## 2015-05-30 DIAGNOSIS — M25531 Pain in right wrist: Secondary | ICD-10-CM | POA: Diagnosis not present

## 2015-06-19 DIAGNOSIS — R413 Other amnesia: Secondary | ICD-10-CM | POA: Diagnosis not present

## 2015-06-19 DIAGNOSIS — F0280 Dementia in other diseases classified elsewhere without behavioral disturbance: Secondary | ICD-10-CM | POA: Diagnosis not present

## 2015-06-19 DIAGNOSIS — G309 Alzheimer's disease, unspecified: Secondary | ICD-10-CM | POA: Diagnosis not present

## 2015-07-16 ENCOUNTER — Other Ambulatory Visit: Payer: Self-pay | Admitting: Internal Medicine

## 2015-09-20 ENCOUNTER — Telehealth: Payer: Self-pay

## 2015-09-20 DIAGNOSIS — E2839 Other primary ovarian failure: Secondary | ICD-10-CM

## 2015-09-20 NOTE — Telephone Encounter (Signed)
Per [facility] quality---pt needs to have DEXA before 11/11/2015---spoke to [facility], pt's daughter---she states it is okay to put in the order for DEXA

## 2015-09-20 NOTE — Telephone Encounter (Signed)
Order placed

## 2015-09-24 ENCOUNTER — Other Ambulatory Visit: Payer: Self-pay | Admitting: Internal Medicine

## 2015-10-23 ENCOUNTER — Ambulatory Visit: Payer: Commercial Managed Care - HMO | Attending: Family Medicine

## 2015-10-23 DIAGNOSIS — E538 Deficiency of other specified B group vitamins: Secondary | ICD-10-CM | POA: Diagnosis not present

## 2015-10-23 DIAGNOSIS — G309 Alzheimer's disease, unspecified: Secondary | ICD-10-CM | POA: Diagnosis not present

## 2015-10-23 DIAGNOSIS — F0280 Dementia in other diseases classified elsewhere without behavioral disturbance: Secondary | ICD-10-CM | POA: Diagnosis not present

## 2015-11-06 ENCOUNTER — Other Ambulatory Visit: Payer: Self-pay | Admitting: Internal Medicine

## 2015-11-06 ENCOUNTER — Ambulatory Visit (INDEPENDENT_AMBULATORY_CARE_PROVIDER_SITE_OTHER): Payer: Commercial Managed Care - HMO | Admitting: Internal Medicine

## 2015-11-06 ENCOUNTER — Encounter: Payer: Self-pay | Admitting: Internal Medicine

## 2015-11-06 VITALS — BP 144/82 | HR 63 | Temp 98.5°F | Ht 69.0 in | Wt 185.0 lb

## 2015-11-06 DIAGNOSIS — I10 Essential (primary) hypertension: Secondary | ICD-10-CM

## 2015-11-06 DIAGNOSIS — F329 Major depressive disorder, single episode, unspecified: Secondary | ICD-10-CM

## 2015-11-06 DIAGNOSIS — K219 Gastro-esophageal reflux disease without esophagitis: Secondary | ICD-10-CM | POA: Diagnosis not present

## 2015-11-06 DIAGNOSIS — Z780 Asymptomatic menopausal state: Secondary | ICD-10-CM

## 2015-11-06 DIAGNOSIS — E785 Hyperlipidemia, unspecified: Secondary | ICD-10-CM | POA: Diagnosis not present

## 2015-11-06 DIAGNOSIS — M8580 Other specified disorders of bone density and structure, unspecified site: Secondary | ICD-10-CM

## 2015-11-06 DIAGNOSIS — F0280 Dementia in other diseases classified elsewhere without behavioral disturbance: Secondary | ICD-10-CM

## 2015-11-06 DIAGNOSIS — Z0000 Encounter for general adult medical examination without abnormal findings: Secondary | ICD-10-CM

## 2015-11-06 DIAGNOSIS — F32A Depression, unspecified: Secondary | ICD-10-CM

## 2015-11-06 DIAGNOSIS — G309 Alzheimer's disease, unspecified: Secondary | ICD-10-CM

## 2015-11-06 MED ORDER — PANTOPRAZOLE SODIUM 40 MG PO TBEC: 40.0000 mg | DELAYED_RELEASE_TABLET | Freq: Two times a day (BID) | ORAL | 3 refills | Status: DC

## 2015-11-06 NOTE — Assessment & Plan Note (Signed)
Moderate cognitive impairment with stable functional needs Continue Aricept, Namenda Continue to follow with Dr. Melrose Nakayama

## 2015-11-06 NOTE — Assessment & Plan Note (Signed)
Controlled on Zoloft Wean not indicated at this time

## 2015-11-06 NOTE — Assessment & Plan Note (Signed)
Bone density ordered.

## 2015-11-06 NOTE — Assessment & Plan Note (Signed)
CMET and Lipid Profile today Encouraged her to consume a low fat diet Continue Zocor unless directed otherwise

## 2015-11-06 NOTE — Progress Notes (Signed)
HPI:  Pt presents to the clinic today for her Medicare Wellness Exam. She is also due for 6 month follow up of chronic conditions.  Depression: Mood stable on current dose of Zoloft.  HTN: Not medicated. BP today is 144/82.  GERD: She is having some breakthrough reflux. She is taking Prilosec twice daily.  HLD: Denies myalgias on Zocor. Last lipid profile reviewed. LDL 117.  Memory Impairment: Her daughter reports she has been diagnosed with moderate Alzheimer's dementia. She can still bathe, dress, toilet herself, she just has terrible short term memory. She is taking Aricept, Namenda and B12 daily. She is following with Dr. Melrose Nakayama.   Osteopenia: Bone density was ordered 10/2013 but not completed.  Past Medical History:  Diagnosis Date  . Alzheimer disease   . Depression   . GERD (gastroesophageal reflux disease)   . Hyperlipidemia   . Hypertension     Current Outpatient Prescriptions  Medication Sig Dispense Refill  . aspirin 81 MG tablet Take 81 mg by mouth daily.    Marland Kitchen donepezil (ARICEPT) 10 MG tablet TAKE 1 TABLET AT BEDTIME 90 tablet 1  . Melatonin 5 MG TABS Take 1 tablet by mouth at bedtime and may repeat dose one time if needed.    . memantine (NAMENDA) 5 MG tablet TAKE 1 TABLET TWICE DAILY 180 tablet 1  . omeprazole (PRILOSEC) 40 MG capsule TAKE 1 CAPSULE TWICE DAILY 180 capsule 0  . oxyCODONE-acetaminophen (ROXICET) 5-325 MG tablet Take 1-2 tablets by mouth every 6 (six) hours as needed. 25 tablet 0  . sertraline (ZOLOFT) 50 MG tablet TAKE 1 TABLET EVERY DAY 90 tablet 1  . simvastatin (ZOCOR) 40 MG tablet Take 1 tablet (40 mg total) by mouth daily at 6 PM. 90 tablet 1  . vitamin E 400 UNIT capsule Take 400 Units by mouth daily.     No current facility-administered medications for this visit.     Allergies  Allergen Reactions  . Lisinopril Cough    Family History  Problem Relation Age of Onset  . Heart disease Mother   . Hypertension Mother   . Arthritis Father    . Cancer Father     Prostate  . Cancer Sister     Breast  . Cancer Brother     Throat  . Hypertension Daughter   . Cancer Son     Lung  . Diabetes Paternal Aunt   . Diabetes Paternal Uncle   . Diabetes Paternal Grandfather   . Cancer Sister     Breast  . Cancer Sister     Breast  . Cancer Brother     Prostate  . Diabetes Daughter     Uterine  . Stroke Neg Hx     Social History   Social History  . Marital status: Widowed    Spouse name: N/A  . Number of children: N/A  . Years of education: N/A   Occupational History  . Not on file.   Social History Main Topics  . Smoking status: Never Smoker  . Smokeless tobacco: Never Used  . Alcohol use No  . Drug use: No  . Sexual activity: Not on file   Other Topics Concern  . Not on file   Social History Narrative  . No narrative on file    Hospitiliaztions: None  Health Maintenance:    Flu: 04/2014  Tetanus: unsure  Pneumovax: She thinks she had this in 2014  Prevnar: 07/2013  Zostavax: never, did have chicken pox  Bone Density: unsure  Colonoscopy: She thinks it has been  <5 years  Eye Doctor: 08/2015 at [place]: no, dentures  Mammogram: Unsure of her last one, did have it at [facility]  Pap: no longer screening    Providers:   PCP: Webb Silversmith, NP-C  Podiatrist: Dr. Milinda Pointer  Neurologist: Dr. Melrose Nakayama    I have personally reviewed and have noted:  1. The patient's medical and social history 2. Their use of alcohol, tobacco or illicit drugs 3. Their current medications and supplements 4. The patient's functional ability including ADL's, fall risks, home safety  risks and hearing or visual impairment. 5. Diet and physical activities 6. Evidence for depression or mood disorder  Subjective:   Review of Systems:   Constitutional: Denies fever, malaise, fatigue, headache or abrupt weight changes.  HEENT: Denies eye pain, eye redness, ear pain, ringing in the ears, wax  buildup, nasal congestion, bloody nose, or sore throat. Respiratory: Denies difficulty breathing, shortness of breath, cough or sputum production.   Cardiovascular: Denies chest pain, chest tightness, palpitations or swelling in the hands or feet.  Gastrointestinal: Pt reports reflux. Denies abdominal pain, bloating, constipation, diarrhea or blood in the stool.  GU: Denies urgency, frequency, pain with urination, burning sensation, blood in urine, odor or discharge. Musculoskeletal: Denies decrease in range of motion, difficulty with gait, muscle pain or joint pain and swelling.  Skin: Denies redness, rashes, lesions or ulcercations.  Neurological: Pt reports difficulty with memory. Denies dizziness, difficulty with speech or problems with balance and coordination.  Psych: Pt has history of depression. Denies anxiety, SI/HI.  No other specific complaints in a complete review of systems (except as listed in HPI above).  Objective:  PE:   BP (!) 144/82   Pulse 63   Temp 98.5 F (36.9 C) (Oral)   Ht 5\' 9"  (1.753 m)   Wt 185 lb (83.9 kg)   SpO2 97%   BMI 27.32 kg/m    Wt Readings from Last 3 Encounters:  05/14/15 180 lb (81.6 kg)  05/08/15 186 lb (84.4 kg)  10/31/14 187 lb (84.8 kg)    General: Appears her stated age, well developed, well nourished in NAD. Skin: Warm, dry and intact.  Cardiovascular: Normal rate and rhythm. S1,S2 noted.  No murmur, rubs or gallops noted. No JVD or BLE edema. No carotid bruits noted. Pulmonary/Chest: Normal effort and positive vesicular breath sounds. No respiratory distress. No wheezes, rales or ronchi noted.  Abdomen: Soft and nontender. Normal bowel sounds. No distention or masses noted.  Neurological: Alert and oriented. She has noted short term memory loss. Coordination normal.  Psychiatric: She engages. She is happy, and laughing. She makes good eye contact.    BMET    Component Value Date/Time   NA 141 10/29/2014 1430   K 4.1  10/29/2014 1430   CL 104 10/29/2014 1430   CO2 31 10/29/2014 1430   GLUCOSE 96 10/29/2014 1430   BUN 19 10/29/2014 1430   CREATININE 0.85 10/29/2014 1430   CALCIUM 9.3 10/29/2014 1430    Lipid Panel     Component Value Date/Time   CHOL 198 10/29/2014 1430   TRIG 191.0 (H) 10/29/2014 1430   HDL 42.70 10/29/2014 1430   CHOLHDL 5 10/29/2014 1430   VLDL 38.2 10/29/2014 1430   LDLCALC 117 (H) 10/29/2014 1430    CBC    Component Value Date/Time   WBC 7.7 10/29/2014 1430   RBC 4.98 10/29/2014 1430  HGB 13.7 10/29/2014 1430   HCT 41.9 10/29/2014 1430   PLT 309.0 10/29/2014 1430   MCV 84.2 10/29/2014 1430   MCHC 32.7 10/29/2014 1430   RDW 14.6 10/29/2014 1430    Hgb A1C No results found for: HGBA1C    Assessment and Plan:   Medicare Annual Wellness Visit:  Diet: She does not follow any particular diet. She does eat meat. She consume fruits and veggies daily. She does eats some fried food. She drinks mostly coke, coffee, tea and water. Physical activity: Sedentary Depression/mood screen: Negative, on medication Hearing: Intact to whispered voice Visual acuity: Grossly normal.  ADLs: Capable Fall risk: Moderate  Home safety: Good Cognitive evaluation:  EOL planning: No adv directives, full code/ I agree  Preventative Medicine: Encouraged her to get a flu shot in the fall. She does not want Tetanus, Zostovax, Mammogram, Pap Smear, repeat Colonoscopy. Bone Density ordered. Encouraged her to make an annual eye appt. Dentist not indicated.   Next appointment: 1 year, annual exam and follow up  Webb Silversmith, NP

## 2015-11-06 NOTE — Assessment & Plan Note (Signed)
Deteriorated Stop Prilosec eRx for Protonix 40 mg BID

## 2015-11-06 NOTE — Assessment & Plan Note (Signed)
Controlled off meds  Will monitor 

## 2015-11-06 NOTE — Patient Instructions (Signed)

## 2015-11-07 LAB — COMPREHENSIVE METABOLIC PANEL
ALBUMIN: 4.2 g/dL (ref 3.5–5.2)
ALK PHOS: 94 U/L (ref 39–117)
ALT: 10 U/L (ref 0–35)
AST: 19 U/L (ref 0–37)
BUN: 18 mg/dL (ref 6–23)
CHLORIDE: 103 meq/L (ref 96–112)
CO2: 31 mEq/L (ref 19–32)
Calcium: 9.1 mg/dL (ref 8.4–10.5)
Creatinine, Ser: 0.94 mg/dL (ref 0.40–1.20)
GFR: 61.39 mL/min (ref >60.00)
Glucose, Bld: 85 mg/dL (ref 70–99)
Potassium: 4.1 mEq/L (ref 3.5–5.1)
SODIUM: 141 meq/L (ref 135–145)
Total Bilirubin: 0.4 mg/dL (ref 0.2–1.2)
Total Protein: 6.9 g/dL (ref 6.0–8.3)

## 2015-11-07 LAB — VITAMIN D 25 HYDROXY (VIT D DEFICIENCY, FRACTURES): VITD: 31.88 ng/mL (ref 30.00–100.00)

## 2015-11-07 LAB — CBC
HCT: 40.7 % (ref 36.0–46.0)
Hemoglobin: 13.5 g/dL (ref 12.0–15.0)
MCHC: 33.2 g/dL (ref 30.0–36.0)
MCV: 83.3 fl (ref 78.0–100.0)
Platelets: 309 10*3/uL (ref 150.0–400.0)
RBC: 4.88 Mil/uL (ref 3.87–5.11)
RDW: 15.4 % (ref 11.5–15.5)
WBC: 8.6 10*3/uL (ref 4.0–10.5)

## 2015-11-07 LAB — LIPID PANEL
CHOLESTEROL: 190 mg/dL (ref 0–200)
HDL: 45.9 mg/dL (ref >39.00)
LDL CALC: 110 mg/dL — AB (ref 0–99)
NonHDL: 144.21
TRIGLYCERIDES: 170 mg/dL — AB (ref 0.0–149.0)
Total CHOL/HDL Ratio: 4
VLDL: 34 mg/dL (ref 0.0–40.0)

## 2015-11-07 NOTE — Telephone Encounter (Signed)
Is pt supposed to take both medication--please advise

## 2015-11-08 NOTE — Telephone Encounter (Signed)
Daughter had mentioned at [place] that at the last visit with nero he stated that pt's current medications are what she needs to be taking and is okay to continue---per verbal order okay to refill

## 2015-11-08 NOTE — Telephone Encounter (Signed)
She is supposed to be taking both but I was under the impression her neurologist was filling these, if not, ok to refill

## 2015-12-11 ENCOUNTER — Ambulatory Visit
Admission: RE | Admit: 2015-12-11 | Discharge: 2015-12-11 | Disposition: A | Discharge status: Home / Self Care | Payer: Commercial Managed Care - HMO | Source: Ambulatory Visit | Attending: Internal Medicine | Admitting: Internal Medicine

## 2015-12-11 DIAGNOSIS — E2839 Other primary ovarian failure: Secondary | ICD-10-CM | POA: Diagnosis not present

## 2015-12-11 DIAGNOSIS — M810 Age-related osteoporosis without current pathological fracture: Secondary | ICD-10-CM | POA: Diagnosis not present

## 2015-12-11 DIAGNOSIS — Z780 Asymptomatic menopausal state: Secondary | ICD-10-CM

## 2015-12-16 ENCOUNTER — Other Ambulatory Visit: Payer: Self-pay | Admitting: *Deleted

## 2015-12-16 ENCOUNTER — Other Ambulatory Visit: Payer: Self-pay | Admitting: Internal Medicine

## 2015-12-16 ENCOUNTER — Inpatient Hospital Stay
Admission: RE | Admit: 2015-12-16 | Discharge: 2015-12-16 | Disposition: A | Discharge status: Home / Self Care | Payer: Self-pay | Source: Ambulatory Visit | Attending: *Deleted | Admitting: *Deleted

## 2015-12-16 DIAGNOSIS — Z9289 Personal history of other medical treatment: Secondary | ICD-10-CM

## 2015-12-16 DIAGNOSIS — Z1231 Encounter for screening mammogram for malignant neoplasm of breast: Secondary | ICD-10-CM

## 2016-01-07 ENCOUNTER — Other Ambulatory Visit: Payer: Self-pay | Admitting: Internal Medicine

## 2016-01-07 MED ORDER — IBANDRONATE SODIUM 150 MG PO TABS: 150.0000 mg | ORAL_TABLET | ORAL | 4 refills | Status: DC

## 2016-01-08 ENCOUNTER — Ambulatory Visit
Admission: RE | Admit: 2016-01-08 | Discharge: 2016-01-08 | Disposition: A | Discharge status: Home / Self Care | Payer: Commercial Managed Care - HMO | Source: Ambulatory Visit | Attending: Internal Medicine | Admitting: Internal Medicine

## 2016-01-08 DIAGNOSIS — Z1231 Encounter for screening mammogram for malignant neoplasm of breast: Secondary | ICD-10-CM | POA: Diagnosis not present

## 2016-02-11 ENCOUNTER — Other Ambulatory Visit: Payer: Self-pay | Admitting: Internal Medicine

## 2016-02-26 ENCOUNTER — Ambulatory Visit (INDEPENDENT_AMBULATORY_CARE_PROVIDER_SITE_OTHER): Payer: Commercial Managed Care - HMO | Admitting: *Deleted

## 2016-02-26 DIAGNOSIS — Z23 Encounter for immunization: Secondary | ICD-10-CM

## 2016-02-28 ENCOUNTER — Ambulatory Visit (INDEPENDENT_AMBULATORY_CARE_PROVIDER_SITE_OTHER): Payer: Commercial Managed Care - HMO | Admitting: Family Medicine

## 2016-02-28 ENCOUNTER — Encounter: Payer: Self-pay | Admitting: Family Medicine

## 2016-02-28 ENCOUNTER — Ambulatory Visit: Payer: Commercial Managed Care - HMO | Admitting: Family Medicine

## 2016-02-28 VITALS — BP 168/96 | HR 75 | Temp 98.3°F | Wt 183.0 lb

## 2016-02-28 DIAGNOSIS — D1723 Benign lipomatous neoplasm of skin and subcutaneous tissue of right leg: Secondary | ICD-10-CM | POA: Diagnosis not present

## 2016-02-28 NOTE — Progress Notes (Signed)
   Subjective:    Patient ID: Teresa Francis, female    DOB: [birth year], 75 y.o.   MRN: [id number]  HPI This is a 75 yo female who is brought in by her daughter. She has a "knot" on her left upper leg. Has had it for a long time, used to lift heavy things and set them on her upper thigh. No redness or drainage. Only hurts if she presses it really hard. Does not seem to have gotten bigger lately. Wanted to have it checked. Has a lipoma on her back that has been there for many years.  Otherwise has been doing well. No additional concerns today.   Past Medical History:  Diagnosis Date  . Alzheimer disease   . Depression   . GERD (gastroesophageal reflux disease)   . Hyperlipidemia   . Hypertension    Past Surgical History:  Procedure Laterality Date  . CHOLECYSTECTOMY  2016  . TUBAL LIGATION  1978   Family History  Problem Relation Age of Onset  . Heart disease Mother   . Hypertension Mother   . Arthritis Father   . Cancer Father     Prostate  . Cancer Son     Lung  . Cancer Sister     Breast  . Arthritis Sister   . Cancer Brother     Throat  . Hypertension Daughter   . Diabetes Paternal Aunt   . Diabetes Paternal Uncle   . Diabetes Paternal Grandfather   . Cancer Sister     Breast  . Arthritis Sister   . Cancer Sister     Breast  . Arthritis Sister   . Cancer Brother     Prostate  . Diabetes Daughter     Uterine  . Stroke Neg Hx    Social History  Substance Use Topics  . Smoking status: Never Smoker  . Smokeless tobacco: Never Used  . Alcohol use No      Review of Systems Per HPI    Objective:   Physical Exam  Constitutional: She appears well-developed and well-nourished. No distress.  HENT:  Head: Normocephalic and atraumatic.  Eyes: Conjunctivae are normal.  Cardiovascular: Normal rate, regular rhythm and normal heart sounds.   Pulmonary/Chest: Effort normal and breath sounds normal.  Musculoskeletal: Normal range of motion.  Neurological: She  is alert.  Answers questions appropriately, engages in conversation.   Skin: Skin is warm and dry. She is not diaphoretic.     Psychiatric: She has a normal mood and affect. Her behavior is normal. Judgment and thought content normal.  Vitals reviewed.     BP (!) 168/96   Pulse 75   Temp 98.3 F (36.8 C) (Oral)   Wt 183 lb (83 kg)   SpO2 93%   BMI 27.02 kg/m  Wt Readings from Last 3 Encounters:  02/28/16 183 lb (83 kg)  11/06/15 185 lb (83.9 kg)  05/14/15 180 lb (81.6 kg)   BP Readings from Last 3 Encounters:  02/28/16 (!) 168/96  11/06/15 (!) 144/82  05/14/15 (!) 178/80        Assessment & Plan:  1. Lipoma of right lower extremity - Provided written and verbal information regarding diagnosis and treatment. - reassured patient and her daughter, continue to monitor for s/s infection or increased discomfort  Clarene Reamer, FNP-BC  [facility], [place]  02/28/2016 11:39 AM

## 2016-02-28 NOTE — Patient Instructions (Signed)
Please let us know if your cyst gets red, painful or drains  Lipoma Introduction A lipoma is a noncancerous (benign) tumor that is made up of fat cells. This is a very common type of soft-tissue growth. Lipomas are usually found under the skin (subcutaneous). They may occur in any tissue of the body that contains fat. Common areas for lipomas to appear include the back, shoulders, buttocks, and thighs. Lipomas grow slowly, and they are usually painless. Most lipomas do not cause problems and do not require treatment. What are the causes? The cause of this condition is not known. What increases the risk? This condition is more likely to develop in:  People who are 68-29 years old.  People who have a family history of lipomas. What are the signs or symptoms? A lipoma usually appears as a small, round bump under the skin. It may feel soft or rubbery, but the firmness can vary. Most lipomas are not painful. However, a lipoma may become painful if it is located in an area where it pushes on nerves. How is this diagnosed? A lipoma can usually be diagnosed with a physical exam. You may also have tests to confirm the diagnosis and to rule out other conditions. Tests may include:  Imaging tests, such as a CT scan or MRI.  Removal of a tissue sample to be looked at under a microscope (biopsy). How is this treated? Treatment is not needed for small lipomas that are not causing problems. If a lipoma continues to get bigger or it causes problems, removal is often the best option. Lipomas can also be removed to improve appearance. Removal of a lipoma is usually done with a surgery in which the fatty cells and the surrounding capsule are removed. Most often, a medicine that numbs the area (local anesthetic) is used for this procedure. Follow these instructions at home:  Keep all follow-up visits as directed by your health care provider. This is important. Contact a health care provider if:  Your lipoma  becomes larger or hard.  Your lipoma becomes painful, red, or increasingly swollen. These could be signs of infection or a more serious condition. This information is not intended to replace advice given to you by your health care provider. Make sure you discuss any questions you have with your health care provider. Document Released: 02/20/2002 Document Revised: 08/08/2015 Document Reviewed: 02/26/2014  2017 [employer]

## 2016-04-06 ENCOUNTER — Other Ambulatory Visit: Payer: Self-pay | Admitting: Internal Medicine

## 2016-06-10 DIAGNOSIS — G309 Alzheimer's disease, unspecified: Secondary | ICD-10-CM | POA: Diagnosis not present

## 2016-06-10 DIAGNOSIS — E538 Deficiency of other specified B group vitamins: Secondary | ICD-10-CM | POA: Diagnosis not present

## 2016-06-10 DIAGNOSIS — F0280 Dementia in other diseases classified elsewhere without behavioral disturbance: Secondary | ICD-10-CM | POA: Diagnosis not present

## 2016-07-11 ENCOUNTER — Emergency Department (HOSPITAL_COMMUNITY): Payer: Medicare HMO

## 2016-07-11 ENCOUNTER — Encounter (HOSPITAL_COMMUNITY): Payer: Self-pay | Admitting: Oncology

## 2016-07-11 ENCOUNTER — Inpatient Hospital Stay (HOSPITAL_COMMUNITY)
Admission: EM | Admit: 2016-07-11 | Discharge: 2016-07-16 | DRG: 086 | Disposition: A | Discharge status: Home Health | Payer: Medicare HMO | Attending: Surgery | Admitting: Surgery

## 2016-07-11 DIAGNOSIS — G309 Alzheimer's disease, unspecified: Secondary | ICD-10-CM | POA: Diagnosis present

## 2016-07-11 DIAGNOSIS — R079 Chest pain, unspecified: Secondary | ICD-10-CM | POA: Diagnosis not present

## 2016-07-11 DIAGNOSIS — S12101A Unspecified nondisplaced fracture of second cervical vertebra, initial encounter for closed fracture: Secondary | ICD-10-CM | POA: Diagnosis not present

## 2016-07-11 DIAGNOSIS — M1711 Unilateral primary osteoarthritis, right knee: Secondary | ICD-10-CM | POA: Diagnosis not present

## 2016-07-11 DIAGNOSIS — S82035A Nondisplaced transverse fracture of left patella, initial encounter for closed fracture: Secondary | ICD-10-CM

## 2016-07-11 DIAGNOSIS — E785 Hyperlipidemia, unspecified: Secondary | ICD-10-CM | POA: Diagnosis present

## 2016-07-11 DIAGNOSIS — I951 Orthostatic hypotension: Secondary | ICD-10-CM | POA: Diagnosis not present

## 2016-07-11 DIAGNOSIS — I10 Essential (primary) hypertension: Secondary | ICD-10-CM | POA: Diagnosis present

## 2016-07-11 DIAGNOSIS — S0219XA Other fracture of base of skull, initial encounter for closed fracture: Secondary | ICD-10-CM | POA: Diagnosis not present

## 2016-07-11 DIAGNOSIS — W19XXXA Unspecified fall, initial encounter: Secondary | ICD-10-CM | POA: Diagnosis not present

## 2016-07-11 DIAGNOSIS — R402142 Coma scale, eyes open, spontaneous, at arrival to emergency department: Secondary | ICD-10-CM | POA: Diagnosis present

## 2016-07-11 DIAGNOSIS — Z800 Family history of malignant neoplasm of digestive organs: Secondary | ICD-10-CM | POA: Diagnosis not present

## 2016-07-11 DIAGNOSIS — Z803 Family history of malignant neoplasm of breast: Secondary | ICD-10-CM | POA: Diagnosis not present

## 2016-07-11 DIAGNOSIS — S2231XA Fracture of one rib, right side, initial encounter for closed fracture: Secondary | ICD-10-CM | POA: Diagnosis not present

## 2016-07-11 DIAGNOSIS — Z8249 Family history of ischemic heart disease and other diseases of the circulatory system: Secondary | ICD-10-CM | POA: Diagnosis not present

## 2016-07-11 DIAGNOSIS — Z8261 Family history of arthritis: Secondary | ICD-10-CM

## 2016-07-11 DIAGNOSIS — W109XXA Fall (on) (from) unspecified stairs and steps, initial encounter: Secondary | ICD-10-CM | POA: Diagnosis not present

## 2016-07-11 DIAGNOSIS — S0180XA Unspecified open wound of other part of head, initial encounter: Secondary | ICD-10-CM | POA: Diagnosis not present

## 2016-07-11 DIAGNOSIS — S299XXA Unspecified injury of thorax, initial encounter: Secondary | ICD-10-CM | POA: Diagnosis not present

## 2016-07-11 DIAGNOSIS — Z7983 Long term (current) use of bisphosphonates: Secondary | ICD-10-CM

## 2016-07-11 DIAGNOSIS — I471 Supraventricular tachycardia: Secondary | ICD-10-CM | POA: Diagnosis not present

## 2016-07-11 DIAGNOSIS — S82092A Other fracture of left patella, initial encounter for closed fracture: Secondary | ICD-10-CM | POA: Diagnosis not present

## 2016-07-11 DIAGNOSIS — S0285XA Fracture of orbit, unspecified, initial encounter for closed fracture: Secondary | ICD-10-CM

## 2016-07-11 DIAGNOSIS — S82024A Nondisplaced longitudinal fracture of right patella, initial encounter for closed fracture: Secondary | ICD-10-CM | POA: Diagnosis not present

## 2016-07-11 DIAGNOSIS — K219 Gastro-esophageal reflux disease without esophagitis: Secondary | ICD-10-CM | POA: Diagnosis present

## 2016-07-11 DIAGNOSIS — S82002A Unspecified fracture of left patella, initial encounter for closed fracture: Secondary | ICD-10-CM

## 2016-07-11 DIAGNOSIS — S12191A Other nondisplaced fracture of second cervical vertebra, initial encounter for closed fracture: Secondary | ICD-10-CM

## 2016-07-11 DIAGNOSIS — Z23 Encounter for immunization: Secondary | ICD-10-CM

## 2016-07-11 DIAGNOSIS — S82091A Other fracture of right patella, initial encounter for closed fracture: Secondary | ICD-10-CM | POA: Diagnosis not present

## 2016-07-11 DIAGNOSIS — R402362 Coma scale, best motor response, obeys commands, at arrival to emergency department: Secondary | ICD-10-CM | POA: Diagnosis present

## 2016-07-11 DIAGNOSIS — Z8042 Family history of malignant neoplasm of prostate: Secondary | ICD-10-CM | POA: Diagnosis not present

## 2016-07-11 DIAGNOSIS — Z888 Allergy status to other drugs, medicaments and biological substances status: Secondary | ICD-10-CM | POA: Diagnosis not present

## 2016-07-11 DIAGNOSIS — S0011XA Contusion of right eyelid and periocular area, initial encounter: Secondary | ICD-10-CM | POA: Diagnosis not present

## 2016-07-11 DIAGNOSIS — F0280 Dementia in other diseases classified elsewhere without behavioral disturbance: Secondary | ICD-10-CM | POA: Diagnosis present

## 2016-07-11 DIAGNOSIS — S0181XA Laceration without foreign body of other part of head, initial encounter: Secondary | ICD-10-CM | POA: Diagnosis not present

## 2016-07-11 DIAGNOSIS — S12291A Other nondisplaced fracture of third cervical vertebra, initial encounter for closed fracture: Secondary | ICD-10-CM | POA: Diagnosis not present

## 2016-07-11 DIAGNOSIS — R55 Syncope and collapse: Secondary | ICD-10-CM | POA: Diagnosis not present

## 2016-07-11 DIAGNOSIS — T148XXA Other injury of unspecified body region, initial encounter: Secondary | ICD-10-CM

## 2016-07-11 DIAGNOSIS — Z9049 Acquired absence of other specified parts of digestive tract: Secondary | ICD-10-CM | POA: Diagnosis not present

## 2016-07-11 DIAGNOSIS — S12201A Unspecified nondisplaced fracture of third cervical vertebra, initial encounter for closed fracture: Secondary | ICD-10-CM | POA: Diagnosis not present

## 2016-07-11 DIAGNOSIS — Z833 Family history of diabetes mellitus: Secondary | ICD-10-CM

## 2016-07-11 DIAGNOSIS — S82001A Unspecified fracture of right patella, initial encounter for closed fracture: Secondary | ICD-10-CM | POA: Diagnosis not present

## 2016-07-11 DIAGNOSIS — R402252 Coma scale, best verbal response, oriented, at arrival to emergency department: Secondary | ICD-10-CM | POA: Diagnosis present

## 2016-07-11 DIAGNOSIS — S0231XA Fracture of orbital floor, right side, initial encounter for closed fracture: Secondary | ICD-10-CM | POA: Diagnosis not present

## 2016-07-11 DIAGNOSIS — Z7982 Long term (current) use of aspirin: Secondary | ICD-10-CM | POA: Diagnosis not present

## 2016-07-11 DIAGNOSIS — R220 Localized swelling, mass and lump, head: Secondary | ICD-10-CM | POA: Diagnosis not present

## 2016-07-11 DIAGNOSIS — W108XXA Fall (on) (from) other stairs and steps, initial encounter: Secondary | ICD-10-CM | POA: Diagnosis not present

## 2016-07-11 DIAGNOSIS — M542 Cervicalgia: Secondary | ICD-10-CM | POA: Diagnosis not present

## 2016-07-11 DIAGNOSIS — S82025A Nondisplaced longitudinal fracture of left patella, initial encounter for closed fracture: Secondary | ICD-10-CM | POA: Diagnosis not present

## 2016-07-11 DIAGNOSIS — S0003XA Contusion of scalp, initial encounter: Secondary | ICD-10-CM | POA: Diagnosis not present

## 2016-07-11 DIAGNOSIS — S098XXA Other specified injuries of head, initial encounter: Secondary | ICD-10-CM | POA: Diagnosis not present

## 2016-07-11 DIAGNOSIS — S12100A Unspecified displaced fracture of second cervical vertebra, initial encounter for closed fracture: Secondary | ICD-10-CM | POA: Diagnosis not present

## 2016-07-11 LAB — CBC WITH DIFFERENTIAL/PLATELET
BASOS ABS: 0 10*3/uL (ref 0.0–0.1)
BASOS PCT: 0 %
EOS ABS: 0 10*3/uL (ref 0.0–0.7)
Eosinophils Relative: 0 %
HEMATOCRIT: 40.8 % (ref 36.0–46.0)
Hemoglobin: 13.3 g/dL (ref 12.0–15.0)
Lymphocytes Relative: 16 %
Lymphs Abs: 2.5 10*3/uL (ref 0.7–4.0)
MCH: 27.1 pg (ref 26.0–34.0)
MCHC: 32.6 g/dL (ref 30.0–36.0)
MCV: 83.3 fL (ref 78.0–100.0)
MONO ABS: 0.6 10*3/uL (ref 0.1–1.0)
Monocytes Relative: 4 %
NEUTROS ABS: 12 10*3/uL — AB (ref 1.7–7.7)
NEUTROS PCT: 80 %
Platelets: 324 10*3/uL (ref 150–400)
RBC: 4.9 MIL/uL (ref 3.87–5.11)
RDW: 15.3 % (ref 11.5–15.5)
WBC: 15.1 10*3/uL — ABNORMAL HIGH (ref 4.0–10.5)

## 2016-07-11 LAB — I-STAT TROPONIN, ED: TROPONIN I, POC: 0 ng/mL (ref 0.00–0.08)

## 2016-07-11 LAB — BASIC METABOLIC PANEL
ANION GAP: 11 (ref 5–15)
BUN: 17 mg/dL (ref 6–20)
CALCIUM: 9.2 mg/dL (ref 8.9–10.3)
CO2: 26 mmol/L (ref 22–32)
CREATININE: 1.03 mg/dL — AB (ref 0.44–1.00)
Chloride: 101 mmol/L (ref 101–111)
GFR, EST AFRICAN AMERICAN: 59 mL/min — AB (ref >60)
GFR, EST NON AFRICAN AMERICAN: 51 mL/min — AB (ref >60)
Glucose, Bld: 135 mg/dL — ABNORMAL HIGH (ref 65–99)
Potassium: 3.8 mmol/L (ref 3.5–5.1)
SODIUM: 138 mmol/L (ref 135–145)

## 2016-07-11 MED ORDER — HYDROMORPHONE HCL 1 MG/ML IJ SOLN: 0.5000 mg | INTRAMUSCULAR | Status: DC | PRN

## 2016-07-11 MED ORDER — MELATONIN 3 MG PO TABS
1.0000 | ORAL_TABLET | Freq: Every evening | ORAL | Status: DC | PRN
Start: 2016-07-12 — End: 2016-07-16
  Administered 2016-07-12 – 2016-07-15 (×5): 3 mg via ORAL
  Filled 2016-07-11 (×10): qty 1

## 2016-07-11 MED ORDER — SERTRALINE HCL 50 MG PO TABS
50.0000 mg | ORAL_TABLET | Freq: Every day | ORAL | Status: DC
  Administered 2016-07-12 – 2016-07-16 (×5): 50 mg via ORAL
  Filled 2016-07-11 (×5): qty 1

## 2016-07-11 MED ORDER — IBANDRONATE SODIUM 150 MG PO TABS: 150.0000 mg | ORAL_TABLET | ORAL | Status: DC

## 2016-07-11 MED ORDER — ONDANSETRON HCL 4 MG/2ML IJ SOLN: 4.0000 mg | Freq: Four times a day (QID) | INTRAMUSCULAR | Status: DC | PRN

## 2016-07-11 MED ORDER — MEMANTINE HCL 5 MG PO TABS
5.0000 mg | ORAL_TABLET | Freq: Two times a day (BID) | ORAL | Status: DC
  Administered 2016-07-12 – 2016-07-16 (×10): 5 mg via ORAL
  Filled 2016-07-11 (×11): qty 1

## 2016-07-11 MED ORDER — PANTOPRAZOLE SODIUM 40 MG PO TBEC
40.0000 mg | DELAYED_RELEASE_TABLET | Freq: Two times a day (BID) | ORAL | Status: DC
  Administered 2016-07-12 – 2016-07-16 (×9): 40 mg via ORAL
  Filled 2016-07-11 (×9): qty 1

## 2016-07-11 MED ORDER — ONDANSETRON HCL 4 MG PO TABS: 4.0000 mg | ORAL_TABLET | Freq: Four times a day (QID) | ORAL | Status: DC | PRN

## 2016-07-11 MED ORDER — DEXTROSE-NACL 5-0.9 % IV SOLN
INTRAVENOUS | Status: DC
  Administered 2016-07-12: 01:00:00 via INTRAVENOUS

## 2016-07-11 MED ORDER — TETANUS-DIPHTH-ACELL PERTUSSIS 5-2.5-18.5 LF-MCG/0.5 IM SUSP
0.5000 mL | Freq: Once | INTRAMUSCULAR | Status: AC
  Administered 2016-07-11: 0.5 mL via INTRAMUSCULAR
  Filled 2016-07-11: qty 0.5

## 2016-07-11 MED ORDER — VITAMIN D 1000 UNITS PO TABS
1000.0000 [IU] | ORAL_TABLET | Freq: Every day | ORAL | Status: DC
Start: 2016-07-12 — End: 2016-07-16
  Administered 2016-07-12 – 2016-07-16 (×5): 1000 [IU] via ORAL
  Filled 2016-07-11 (×5): qty 1

## 2016-07-11 MED ORDER — ONDANSETRON HCL 4 MG/2ML IJ SOLN
4.0000 mg | Freq: Once | INTRAMUSCULAR | Status: AC
  Administered 2016-07-11: 4 mg via INTRAVENOUS
  Filled 2016-07-11: qty 2

## 2016-07-11 MED ORDER — DONEPEZIL HCL 10 MG PO TABS
10.0000 mg | ORAL_TABLET | Freq: Every day | ORAL | Status: DC
  Administered 2016-07-12 – 2016-07-15 (×5): 10 mg via ORAL
  Filled 2016-07-11 (×5): qty 1

## 2016-07-11 MED ORDER — OXYCODONE HCL 5 MG PO TABS
5.0000 mg | ORAL_TABLET | ORAL | Status: DC | PRN
  Administered 2016-07-12 (×2): 5 mg via ORAL
  Filled 2016-07-11 (×2): qty 1

## 2016-07-11 MED ORDER — VITAMIN B-12 1000 MCG PO TABS
1000.0000 ug | ORAL_TABLET | Freq: Every day | ORAL | Status: DC
  Administered 2016-07-12 – 2016-07-16 (×5): 1000 ug via ORAL
  Filled 2016-07-11 (×5): qty 1

## 2016-07-11 MED ORDER — LIDOCAINE-EPINEPHRINE (PF) 2 %-1:200000 IJ SOLN
20.0000 mL | Freq: Once | INTRAMUSCULAR | Status: AC
  Administered 2016-07-12: 20 mL
  Filled 2016-07-11: qty 20

## 2016-07-11 MED ORDER — SIMVASTATIN 40 MG PO TABS
40.0000 mg | ORAL_TABLET | Freq: Every day | ORAL | Status: DC
  Administered 2016-07-12 – 2016-07-15 (×4): 40 mg via ORAL
  Filled 2016-07-11 (×5): qty 1

## 2016-07-11 MED ORDER — ENOXAPARIN SODIUM 40 MG/0.4ML ~~LOC~~ SOLN
40.0000 mg | SUBCUTANEOUS | Status: DC
  Administered 2016-07-12 – 2016-07-16 (×5): 40 mg via SUBCUTANEOUS
  Filled 2016-07-11 (×5): qty 0.4

## 2016-07-11 MED ORDER — FENTANYL CITRATE (PF) 100 MCG/2ML IJ SOLN
50.0000 ug | Freq: Once | INTRAMUSCULAR | Status: AC
  Administered 2016-07-11: 50 ug via INTRAVENOUS
  Filled 2016-07-11: qty 2

## 2016-07-11 NOTE — Consult Note (Signed)
Hospitalist Service Medical Consultation   Teresa Francis  HQP:[id number]  DOB: October 29, 1940  DOA: 07/11/2016  PCP: Webb Silversmith, NP   Outpatient Specialists:  None   Requesting physician: Lorre Munroe, PA-C  Reason for consultation: Hypertension, Syncope   History of Present Illness: Teresa Francis is an 76 y.o. female with past medical history significant for mild dementia, hypertension who presents with mechanical fall and facial fractures.  The patient has dementia and so she is unsure what happened. Per daughter, the family had all been downstairs having family time, when they all left to go upstairs. Most of the family went out a side door to go around the house, but the patient started to climb the stairs. She then fell and was found lying on the ground bleeding from her face. Initially she told family that she had dropped something, turned around to grab it, and fell down striking her knees and face.  Since then, she "tells a different story every time she's asked".  She describes to me that she "must have blacked out", but denies chest pain, presyncopal symptoms, shortness of breath, palpitations, lightheadedness, feeling of warmth, wooziness.  ED course: -Afebrile, heart rate 53, respirations pulse ox normal, blood pressure 183/88 -Sodium 138, potassium 3.8, creatinine 1.0 (baseline 0.9), WBC 15.1 K, hemoglobin normal -Troponin negative -Chest x-ray without focal opacity or edema -ECG showed sinus bradycardia, no ST changes, normal QRS and QT intervals -Bilateral knee radiographs, CT of the head and C-spine were obtained -Neurosurgery was consulted who recommended a immobilizer -The patient will be admitted by trauma surgery -TRH were asked to consult for possible syncope and hypertension   The patient used to take HCTZ, but this was stopped for hypokalemia and her blood pressure is 130s over 90s since then.  She has had no previous history of syncope,  palpitations, arrhythmia, coronary disease, stroke, CHF. She has not had previous history of falls, or problems with orthostasis, dizziness.      Review of Systems:  Review of Systems  Constitutional: Negative for chills and fever.  Respiratory: Negative for shortness of breath.   Cardiovascular: Negative for chest pain and palpitations.  Musculoskeletal: Positive for falls.  Neurological: Positive for loss of consciousness (?). Negative for dizziness, sensory change, speech change, seizures and headaches.  Psychiatric/Behavioral: Positive for memory loss.  All other systems reviewed and are negative.    Past Medical History: Past Medical History:  Diagnosis Date  . Alzheimer disease   . Depression   . GERD (gastroesophageal reflux disease)   . Hyperlipidemia   . Hypertension     Past Surgical History: Past Surgical History:  Procedure Laterality Date  . CHOLECYSTECTOMY  2016  . TUBAL LIGATION  1978     Allergies:   Allergies  Allergen Reactions  . Lisinopril Cough     Social History:  reports that she has never smoked. She has never used smokeless tobacco. She reports that she does not drink alcohol or use drugs.   Family History: Family History  Problem Relation Age of Onset  . Heart disease Mother   . Hypertension Mother   . Arthritis Father   . Cancer Father     Prostate  . Cancer Son     Lung  . Cancer Sister     Breast  . Arthritis Sister   . Cancer Brother     Throat  . Hypertension Daughter   .  Diabetes Paternal Aunt   . Diabetes Paternal Uncle   . Diabetes Paternal Grandfather   . Cancer Sister     Breast  . Arthritis Sister   . Cancer Sister     Breast  . Arthritis Sister   . Cancer Brother     Prostate  . Diabetes Daughter     Uterine  . Stroke Neg Hx      Physical Exam: Vitals:   07/11/16 2245 07/11/16 2300 07/11/16 2315 07/11/16 2330  BP: (!) 171/78 (!) 166/84 (!) 153/77 (!) 171/82  Pulse: (!) 58 63 (!) 41 64  Resp: 12  14 13 12   Temp:      TempSrc:      SpO2: 99% 100% 99% 95%  Weight:      Height:        Constitutional: Alert and awake, oriented x3, not in any acute distress. Eyes: Pupils pinpoint, equal, reactive, EOMI, irises appear normal, anicteric sclera,  ENMT: external ears and nose appear normal, hearing normal, right eye bruising            Lips appears normal, oropharynx mucosa, tongue, posterior pharynx appear normal  Neck: neck appears normal, no masses, in brace CVS: S1-S2 clear, no murmur rubs or gallops, no LE edema, normal pedal pulses  Respiratory:  clear to auscultation bilaterally, no wheezing, rales or rhonchi. Respiratory effort normal. No accessory muscle use.  GI: soft nontender, nondistended, normal bowel sounds, no hepatosplenomegaly, no hernias  Musculoskeletal: no cyanosis, clubbing or edema noted bilaterally, abrasions to hands, knee caps tender Neuro: Cranial nerves III-XII intact, strength in upper extremities is 5/5 and symetric, lower extremities aer limited by pain, but symmetric, symetric, knows hospital, daughter, events of today (but not details), can't state she is in [place], states it is March 2016 Psych: judgement and insight appear normal, stable mood and affect, mental status normal Skin: no rashes or lesions or ulcers, no induration or nodules    Data reviewed:  I have personally reviewed following labs and imaging studies Labs:  CBC:  Recent Labs Lab 07/11/16 2127  WBC 15.1*  NEUTROABS 12.0*  HGB 13.3  HCT 40.8  MCV 83.3  PLT 735    Basic Metabolic Panel:  Recent Labs Lab 07/11/16 2127  NA 138  K 3.8  CL 101  CO2 26  GLUCOSE 135*  BUN 17  CREATININE 1.03*  CALCIUM 9.2   GFR Estimated Creatinine Clearance: 52.8 mL/min (A) (by C-G formula based on SCr of 1.03 mg/dL (H)). Liver Function Tests: No results for input(s): AST, ALT, ALKPHOS, BILITOT, PROT, ALBUMIN in the last 168 hours. No results for input(s): LIPASE, AMYLASE in the  last 168 hours. No results for input(s): AMMONIA in the last 168 hours. Coagulation profile No results for input(s): INR, PROTIME in the last 168 hours.  Cardiac Enzymes: No results for input(s): CKTOTAL, CKMB, CKMBINDEX, TROPONINI in the last 168 hours. BNP: Invalid input(s): POCBNP CBG: No results for input(s): GLUCAP in the last 168 hours. D-Dimer No results for input(s): DDIMER in the last 72 hours. Hgb A1c No results for input(s): HGBA1C in the last 72 hours. Lipid Profile No results for input(s): CHOL, HDL, LDLCALC, TRIG, CHOLHDL, LDLDIRECT in the last 72 hours. Thyroid function studies No results for input(s): TSH, T4TOTAL, T3FREE, THYROIDAB in the last 72 hours.  Invalid input(s): FREET3 Anemia work up No results for input(s): VITAMINB12, FOLATE, FERRITIN, TIBC, IRON, RETICCTPCT in the last 72 hours. Urinalysis No results found for: COLORURINE,  APPEARANCEUR, LABSPEC, PHURINE, GLUCOSEU, HGBUR, BILIRUBINUR, KETONESUR, PROTEINUR, UROBILINOGEN, NITRITE, LEUKOCYTESUR   Sepsis Labs Invalid input(s): PROCALCITONIN,  WBC,  LACTICIDVEN Microbiology No results found for this or any previous visit (from the past 240 hour(s)).     Inpatient Medications:   Scheduled Meds: . [START ON 07/12/2016] cholecalciferol  1,000 Units Oral Daily  . donepezil  10 mg Oral QHS  . [START ON 07/12/2016] enoxaparin (LOVENOX) injection  40 mg Subcutaneous Q24H  . ibandronate  150 mg Oral Q30 days  . lidocaine-EPINEPHrine  20 mL Infiltration Once  . [START ON 07/12/2016] Melatonin  1 tablet Oral QHS,MR X 1  . memantine  5 mg Oral BID  . pantoprazole  40 mg Oral BID  . [START ON 07/12/2016] sertraline  50 mg Oral Daily  . [START ON 07/12/2016] simvastatin  40 mg Oral q1800  . [START ON 07/12/2016] vitamin B-12  1,000 mcg Oral Daily   Continuous Infusions: . dextrose 5 % and 0.9% NaCl       Radiological Exams on Admission: CXR personally reviewed shows no pneumonia, pneumothorax, edema; CT  head, cpine, and knee radiograph reports reviewed: Dg Chest 1 View  Result Date: 07/11/2016 CLINICAL DATA:  Pain after fall EXAM: CHEST 1 VIEW COMPARISON:  None. FINDINGS: The heart size and mediastinal contours are within normal limits. Minimal biapical pleuroparenchymal scarring. Subtle irregularity of the posterior right sixth rib is suggested which may represent a nondisplaced fracture. This area is difficult to assess due to adjacent atelectatic lung. No pneumothorax is seen. No effusion. IMPRESSION: Subtle lucency is suggested of the posterior right sixth rib with adjacent atelectasis the may represent a nondisplaced fracture. Otherwise negative exam. Electronically Signed   By: Ashley Royalty M.D.   On: 07/11/2016 21:29   Ct Head Wo Contrast  Result Date: 07/11/2016 CLINICAL DATA:  Pt states she she "blacked out" and fell down 3-4 steps tonight. Pt currently c/o head and neck pain. Laceration to left forehead noted. EXAM: CT HEAD WITHOUT CONTRAST CT MAXILLOFACIAL WITHOUT CONTRAST CT CERVICAL SPINE WITHOUT CONTRAST TECHNIQUE: Multidetector CT imaging of the head, cervical spine, and maxillofacial structures were performed using the standard protocol without intravenous contrast. Multiplanar CT image reconstructions of the cervical spine and maxillofacial structures were also generated. COMPARISON:  05/14/2015 FINDINGS: CT HEAD FINDINGS Brain: No evidence of acute infarction, hemorrhage, hydrocephalus, extra-axial collection or mass lesion/mass effect. Vascular: No hyperdense vessel or unexpected calcification. Skull: Normal. Negative for fracture or focal lesion. Other: There is a frontal scalp hematoma just to the right of midline. CT MAXILLOFACIAL FINDINGS Osseous: There is a fracture, there is comminuted fracture of the right orbital floor. Along mid orbit, there is mild depression of the fracture, of 4 mm. There is no entrapment of the extra-ocular muscles. The fracture extends to the anterior  infraorbital rim. There is a subtle fracture along the lateral right maxillary sinus wall. There is air along the lateral left maxillary sinus wall. There is no definite fracture, but 1 is suspected given the presence of air as well as a small amount of non dependent hemorrhage in the sinus. There is no left orbital floor fracture. A subtle fracture of the lateral left orbital wall is suggested, nondisplaced. No other evidence of fractures.  No bone lesions. Orbits: No injury to either globe. No orbital hematoma or postseptal orbital inflammation. Sinuses: Dependent hemorrhage is noted in the maxillary sinuses, right greater than left, as well as the right sphenoid sinus. Remaining sinuses are clear. The visualized  mastoid air cells and middle ear cavities are clear. Soft tissues: Small amounts of soft tissue air is seen adjacent to the right maxillary sinus and along the right cheek and adjacent to the left lateral orbit and lateral maxillary sinus. No soft tissue masses or adenopathy. CT CERVICAL SPINE FINDINGS Alignment: Normal. Skull base and vertebrae: There is a fracture, nondisplaced, of the anterior inferior corner of the C2 vertebra without associated soft tissue swelling. There is a questionable acute fracture of the distal aspect of the C3 spinous process, most suggested on the sagittal view, also with no soft tissue swelling. No other evidence of a fracture. No osteoblastic or osteolytic lesions. Soft tissues and spinal canal: No spinal canal mass, herniated disc or hematoma. There is heterogeneous enlargement of the left lobe of the thyroid gland consistent with goiter. Disc levels: Moderate loss of disc height with endplate spurring at I2-L7. Facet degenerative changes noted bilaterally. No significant central stenosis or neural foraminal narrowing. Upper chest: Apical pleuroparenchymal scarring. No acute findings in the lung apices. Other: None. IMPRESSION: HEAD CT 1. No acute intracranial  abnormalities. 2. No skull fracture. 3. Frontal scalp hematoma. MAXILLOFACIAL CT 1. Mildly depressed comminuted fracture of the right orbital floor. 2. Subtle fracture of the right lateral maxillary sinus wall. Probable nondisplaced fracture of the left lateral maxillary sinus wall. Possible subtle fracture of the lateral left orbital wall. 3. Dependent hemorrhage in the maxillary sinuses and right sphenoid sinus. CERVICAL CT 1. Small nondisplaced fracture along the anterior inferior corner of the C2 vertebra. This appears acute but there is no associated soft tissue swelling or hemorrhage. Fractures new since the prior CT. 2. Apparent fracture along the distal margin of the C3 spinous process, nondisplaced. 3. No other fractures or acute findings. Electronically Signed   By: Lajean Manes M.D.   On: 07/11/2016 21:56   Ct Cervical Spine Wo Contrast  Result Date: 07/11/2016 CLINICAL DATA:  Pt states she she "blacked out" and fell down 3-4 steps tonight. Pt currently c/o head and neck pain. Laceration to left forehead noted. EXAM: CT HEAD WITHOUT CONTRAST CT MAXILLOFACIAL WITHOUT CONTRAST CT CERVICAL SPINE WITHOUT CONTRAST TECHNIQUE: Multidetector CT imaging of the head, cervical spine, and maxillofacial structures were performed using the standard protocol without intravenous contrast. Multiplanar CT image reconstructions of the cervical spine and maxillofacial structures were also generated. COMPARISON:  05/14/2015 FINDINGS: CT HEAD FINDINGS Brain: No evidence of acute infarction, hemorrhage, hydrocephalus, extra-axial collection or mass lesion/mass effect. Vascular: No hyperdense vessel or unexpected calcification. Skull: Normal. Negative for fracture or focal lesion. Other: There is a frontal scalp hematoma just to the right of midline. CT MAXILLOFACIAL FINDINGS Osseous: There is a fracture, there is comminuted fracture of the right orbital floor. Along mid orbit, there is mild depression of the fracture, of 4  mm. There is no entrapment of the extra-ocular muscles. The fracture extends to the anterior infraorbital rim. There is a subtle fracture along the lateral right maxillary sinus wall. There is air along the lateral left maxillary sinus wall. There is no definite fracture, but 1 is suspected given the presence of air as well as a small amount of non dependent hemorrhage in the sinus. There is no left orbital floor fracture. A subtle fracture of the lateral left orbital wall is suggested, nondisplaced. No other evidence of fractures.  No bone lesions. Orbits: No injury to either globe. No orbital hematoma or postseptal orbital inflammation. Sinuses: Dependent hemorrhage is noted in the maxillary sinuses,  right greater than left, as well as the right sphenoid sinus. Remaining sinuses are clear. The visualized mastoid air cells and middle ear cavities are clear. Soft tissues: Small amounts of soft tissue air is seen adjacent to the right maxillary sinus and along the right cheek and adjacent to the left lateral orbit and lateral maxillary sinus. No soft tissue masses or adenopathy. CT CERVICAL SPINE FINDINGS Alignment: Normal. Skull base and vertebrae: There is a fracture, nondisplaced, of the anterior inferior corner of the C2 vertebra without associated soft tissue swelling. There is a questionable acute fracture of the distal aspect of the C3 spinous process, most suggested on the sagittal view, also with no soft tissue swelling. No other evidence of a fracture. No osteoblastic or osteolytic lesions. Soft tissues and spinal canal: No spinal canal mass, herniated disc or hematoma. There is heterogeneous enlargement of the left lobe of the thyroid gland consistent with goiter. Disc levels: Moderate loss of disc height with endplate spurring at G3-O7. Facet degenerative changes noted bilaterally. No significant central stenosis or neural foraminal narrowing. Upper chest: Apical pleuroparenchymal scarring. No acute  findings in the lung apices. Other: None. IMPRESSION: HEAD CT 1. No acute intracranial abnormalities. 2. No skull fracture. 3. Frontal scalp hematoma. MAXILLOFACIAL CT 1. Mildly depressed comminuted fracture of the right orbital floor. 2. Subtle fracture of the right lateral maxillary sinus wall. Probable nondisplaced fracture of the left lateral maxillary sinus wall. Possible subtle fracture of the lateral left orbital wall. 3. Dependent hemorrhage in the maxillary sinuses and right sphenoid sinus. CERVICAL CT 1. Small nondisplaced fracture along the anterior inferior corner of the C2 vertebra. This appears acute but there is no associated soft tissue swelling or hemorrhage. Fractures new since the prior CT. 2. Apparent fracture along the distal margin of the C3 spinous process, nondisplaced. 3. No other fractures or acute findings. Electronically Signed   By: Lajean Manes M.D.   On: 07/11/2016 21:56   Dg Knee Complete 4 Views Left  Result Date: 07/11/2016 CLINICAL DATA:  Fall; Pain in both knees, which have slight abrasions; laceration to forehead EXAM: LEFT KNEE - COMPLETE 4+ VIEW COMPARISON:  None. FINDINGS: There is a nondisplaced fracture of the patella with both a sagittal and transverse component. No other fractures.  No bone lesions. The knee joint is normally aligned. There is a joint effusion with a fat fluid level in the suprapatellar joint capsule. Bones are demineralized. IMPRESSION: 1. Nondisplaced patellar fracture with associated joint effusion with a fat fluid level. 2. No other fractures.  No dislocation. Electronically Signed   By: Lajean Manes M.D.   On: 07/11/2016 21:34   Dg Knee Complete 4 Views Right  Result Date: 07/11/2016 CLINICAL DATA:  Fall; Pain in both knees, which have slight abrasions; laceration to forehead EXAM: RIGHT KNEE - COMPLETE 4+ VIEW COMPARISON:  05/14/2015 FINDINGS: There is a small nondisplaced fracture from the superior pole of the patella. No other fractures.  The joint is normally aligned. There is a joint effusion with a fat fluid level in the suprapatellar joint capsule. Bones are demineralized. There is a partly imaged soft tissue mass in the subcutaneous soft tissues of the anterior thigh. This is circumscribed relatively lucent. IMPRESSION: 1. Small nondisplaced fracture from the superior pole of the patella with an associated joint effusion and fat fluid level. 2. Subcutaneous soft tissue mass along the anterior mid thigh, not included on the field of view from the prior study. Recommend followup assessment on  a nonemergent basis with ultrasound and/or CT with contrast. Electronically Signed   By: Lajean Manes M.D.   On: 07/11/2016 21:37   Ct Maxillofacial Wo Contrast  Result Date: 07/11/2016 CLINICAL DATA:  Pt states she she "blacked out" and fell down 3-4 steps tonight. Pt currently c/o head and neck pain. Laceration to left forehead noted. EXAM: CT HEAD WITHOUT CONTRAST CT MAXILLOFACIAL WITHOUT CONTRAST CT CERVICAL SPINE WITHOUT CONTRAST TECHNIQUE: Multidetector CT imaging of the head, cervical spine, and maxillofacial structures were performed using the standard protocol without intravenous contrast. Multiplanar CT image reconstructions of the cervical spine and maxillofacial structures were also generated. COMPARISON:  05/14/2015 FINDINGS: CT HEAD FINDINGS Brain: No evidence of acute infarction, hemorrhage, hydrocephalus, extra-axial collection or mass lesion/mass effect. Vascular: No hyperdense vessel or unexpected calcification. Skull: Normal. Negative for fracture or focal lesion. Other: There is a frontal scalp hematoma just to the right of midline. CT MAXILLOFACIAL FINDINGS Osseous: There is a fracture, there is comminuted fracture of the right orbital floor. Along mid orbit, there is mild depression of the fracture, of 4 mm. There is no entrapment of the extra-ocular muscles. The fracture extends to the anterior infraorbital rim. There is a subtle  fracture along the lateral right maxillary sinus wall. There is air along the lateral left maxillary sinus wall. There is no definite fracture, but 1 is suspected given the presence of air as well as a small amount of non dependent hemorrhage in the sinus. There is no left orbital floor fracture. A subtle fracture of the lateral left orbital wall is suggested, nondisplaced. No other evidence of fractures.  No bone lesions. Orbits: No injury to either globe. No orbital hematoma or postseptal orbital inflammation. Sinuses: Dependent hemorrhage is noted in the maxillary sinuses, right greater than left, as well as the right sphenoid sinus. Remaining sinuses are clear. The visualized mastoid air cells and middle ear cavities are clear. Soft tissues: Small amounts of soft tissue air is seen adjacent to the right maxillary sinus and along the right cheek and adjacent to the left lateral orbit and lateral maxillary sinus. No soft tissue masses or adenopathy. CT CERVICAL SPINE FINDINGS Alignment: Normal. Skull base and vertebrae: There is a fracture, nondisplaced, of the anterior inferior corner of the C2 vertebra without associated soft tissue swelling. There is a questionable acute fracture of the distal aspect of the C3 spinous process, most suggested on the sagittal view, also with no soft tissue swelling. No other evidence of a fracture. No osteoblastic or osteolytic lesions. Soft tissues and spinal canal: No spinal canal mass, herniated disc or hematoma. There is heterogeneous enlargement of the left lobe of the thyroid gland consistent with goiter. Disc levels: Moderate loss of disc height with endplate spurring at I9-C7. Facet degenerative changes noted bilaterally. No significant central stenosis or neural foraminal narrowing. Upper chest: Apical pleuroparenchymal scarring. No acute findings in the lung apices. Other: None. IMPRESSION: HEAD CT 1. No acute intracranial abnormalities. 2. No skull fracture. 3. Frontal  scalp hematoma. MAXILLOFACIAL CT 1. Mildly depressed comminuted fracture of the right orbital floor. 2. Subtle fracture of the right lateral maxillary sinus wall. Probable nondisplaced fracture of the left lateral maxillary sinus wall. Possible subtle fracture of the lateral left orbital wall. 3. Dependent hemorrhage in the maxillary sinuses and right sphenoid sinus. CERVICAL CT 1. Small nondisplaced fracture along the anterior inferior corner of the C2 vertebra. This appears acute but there is no associated soft tissue swelling or hemorrhage. Fractures  new since the prior CT. 2. Apparent fracture along the distal margin of the C3 spinous process, nondisplaced. 3. No other fractures or acute findings. Electronically Signed   By: Lajean Manes M.D.   On: 07/11/2016 21:56   ECG: Independently reviewed.  Rate 55, QTc 485, normal sinus, no ST changes.      Impression/Recommendations  1. Syncope vs mechanical fall: Without ability to collect history reliably, it is speculation that patient syncopized rather than had a mechanical fall.  Can't obtain orthostatics, although nothing on med list or history to suggest it. -Reasonable to obtain echocardiogram, this is ordered -Monitor for arrhythmia on tele -Not unreasonable to arrange with Cards master for event monitor at discharge if nothing noted on telemetry during hospital stay   2. Leukocytosis: Likely reactive -Culture for fever  3. Hypertension: Chronic, but recently well controlled off BP meds.  Suspect this is mostly stress response at present.  Has been intolerant of HCTZ and ACEis before -First, address pain adequately -Oral labetalol ordered PRN for SBP > 170 -If BP persistently elevated, could start low dose amlodipine        Thank you for this consultation.  Our [facility] hospitalist team will follow the patient with you.     Edwin Dada M.D. [facility] 07/11/2016, 11:45 PM

## 2016-07-11 NOTE — ED Provider Notes (Signed)
Patient seen and evaluated. Discussed with Lorre Munroe PA. Patient fell down the last 2 stairs at home and landed on knees. She struck her face against the ground. She has contusion and ecchymosis around her right eye. She does not have extraocular movement entrapment. Some blood in the nares but no septal hematoma. No dental trauma. She complains of neck pain anteriorly and posteriorly. His discomfort in the lateral ribs. Normal breath sounds and saturations. She has ecchymosis of both knees.  Multiple injuries--orbital fractures, sinus fractures, C@ anterior fracture, C3 SP, 6th rib, Bilateral nondisplaced patella fxs. Agree with admission, and consultations per PA.   Tanna Furry, MD 07/11/16 [phone]

## 2016-07-11 NOTE — H&P (Addendum)
History   Teresa Francis is an 76 y.o. female.   Chief Complaint:  Chief Complaint  Patient presents with  . Fall    Fall  Associated symptoms include neck pain and weakness. Pertinent negatives include no abdominal pain, chest pain, coughing or rash.   Called to see patient by ED for fall.  She fell down two stairs and struck her face.  She said she blacked out prior to falling.  No HOTN.  Complains of bilateral knee pain and neck pain.  Has laceration of right forehead and facial pain.  She is awake and alert.  Family at bedside  Past Medical History:  Diagnosis Date  . Alzheimer disease   . Depression   . GERD (gastroesophageal reflux disease)   . Hyperlipidemia   . Hypertension     Past Surgical History:  Procedure Laterality Date  . CHOLECYSTECTOMY  2016  . TUBAL LIGATION  1978    Family History  Problem Relation Age of Onset  . Heart disease Mother   . Hypertension Mother   . Arthritis Father   . Cancer Father     Prostate  . Cancer Son     Lung  . Cancer Sister     Breast  . Arthritis Sister   . Cancer Brother     Throat  . Hypertension Daughter   . Diabetes Paternal Aunt   . Diabetes Paternal Uncle   . Diabetes Paternal Grandfather   . Cancer Sister     Breast  . Arthritis Sister   . Cancer Sister     Breast  . Arthritis Sister   . Cancer Brother     Prostate  . Diabetes Daughter     Uterine  . Stroke Neg Hx    Social History:  reports that she has never smoked. She has never used smokeless tobacco. She reports that she does not drink alcohol or use drugs.  Allergies   Allergies  Allergen Reactions  . Lisinopril Cough    Home Medications   (Not in a hospital admission)  Trauma Course   Results for orders placed or performed during the hospital encounter of 07/11/16 (from the past 48 hour(s))  CBC with Differential/Platelet     Status: Abnormal   Collection Time: 07/11/16  9:27 PM  Result Value Ref Range   WBC 15.1 (H) 4.0 - 10.5  K/uL   RBC 4.90 3.87 - 5.11 MIL/uL   Hemoglobin 13.3 12.0 - 15.0 g/dL   HCT 40.8 36.0 - 46.0 %   MCV 83.3 78.0 - 100.0 fL   MCH 27.1 26.0 - 34.0 pg   MCHC 32.6 30.0 - 36.0 g/dL   RDW 15.3 11.5 - 15.5 %   Platelets 324 150 - 400 K/uL   Neutrophils Relative % 80 %   Neutro Abs 12.0 (H) 1.7 - 7.7 K/uL   Lymphocytes Relative 16 %   Lymphs Abs 2.5 0.7 - 4.0 K/uL   Monocytes Relative 4 %   Monocytes Absolute 0.6 0.1 - 1.0 K/uL   Eosinophils Relative 0 %   Eosinophils Absolute 0.0 0.0 - 0.7 K/uL   Basophils Relative 0 %   Basophils Absolute 0.0 0.0 - 0.1 K/uL  Basic metabolic panel     Status: Abnormal   Collection Time: 07/11/16  9:27 PM  Result Value Ref Range   Sodium 138 135 - 145 mmol/L   Potassium 3.8 3.5 - 5.1 mmol/L   Chloride 101 101 - 111 mmol/L  CO2 26 22 - 32 mmol/L   Glucose, Bld 135 (H) 65 - 99 mg/dL   BUN 17 6 - 20 mg/dL   Creatinine, Ser 1.03 (H) 0.44 - 1.00 mg/dL   Calcium 9.2 8.9 - 10.3 mg/dL   GFR calc non Af Amer 51 (L) >60 mL/min   GFR calc Af Amer 59 (L) >60 mL/min    Comment: (NOTE) The eGFR has been calculated using the CKD EPI equation. This calculation has not been validated in all clinical situations. eGFR's persistently <60 mL/min signify possible Chronic Kidney Disease.    Anion gap 11 5 - 15  I-stat troponin, ED     Status: None   Collection Time: 07/11/16  9:41 PM  Result Value Ref Range   Troponin i, poc 0.00 0.00 - 0.08 ng/mL   Comment 3            Comment: Due to the release kinetics of cTnI, a negative result within the first hours of the onset of symptoms does not rule out myocardial infarction with certainty. If myocardial infarction is still suspected, repeat the test at appropriate intervals.    Dg Chest 1 View  Result Date: 07/11/2016 CLINICAL DATA:  Pain after fall EXAM: CHEST 1 VIEW COMPARISON:  None. FINDINGS: The heart size and mediastinal contours are within normal limits. Minimal biapical pleuroparenchymal scarring. Subtle  irregularity of the posterior right sixth rib is suggested which may represent a nondisplaced fracture. This area is difficult to assess due to adjacent atelectatic lung. No pneumothorax is seen. No effusion. IMPRESSION: Subtle lucency is suggested of the posterior right sixth rib with adjacent atelectasis the may represent a nondisplaced fracture. Otherwise negative exam. Electronically Signed   By: Ashley Royalty M.D.   On: 07/11/2016 21:29   Ct Head Wo Contrast  Result Date: 07/11/2016 CLINICAL DATA:  Pt states she she "blacked out" and fell down 3-4 steps tonight. Pt currently c/o head and neck pain. Laceration to left forehead noted. EXAM: CT HEAD WITHOUT CONTRAST CT MAXILLOFACIAL WITHOUT CONTRAST CT CERVICAL SPINE WITHOUT CONTRAST TECHNIQUE: Multidetector CT imaging of the head, cervical spine, and maxillofacial structures were performed using the standard protocol without intravenous contrast. Multiplanar CT image reconstructions of the cervical spine and maxillofacial structures were also generated. COMPARISON:  05/14/2015 FINDINGS: CT HEAD FINDINGS Brain: No evidence of acute infarction, hemorrhage, hydrocephalus, extra-axial collection or mass lesion/mass effect. Vascular: No hyperdense vessel or unexpected calcification. Skull: Normal. Negative for fracture or focal lesion. Other: There is a frontal scalp hematoma just to the right of midline. CT MAXILLOFACIAL FINDINGS Osseous: There is a fracture, there is comminuted fracture of the right orbital floor. Along mid orbit, there is mild depression of the fracture, of 4 mm. There is no entrapment of the extra-ocular muscles. The fracture extends to the anterior infraorbital rim. There is a subtle fracture along the lateral right maxillary sinus wall. There is air along the lateral left maxillary sinus wall. There is no definite fracture, but 1 is suspected given the presence of air as well as a small amount of non dependent hemorrhage in the sinus. There is  no left orbital floor fracture. A subtle fracture of the lateral left orbital wall is suggested, nondisplaced. No other evidence of fractures.  No bone lesions. Orbits: No injury to either globe. No orbital hematoma or postseptal orbital inflammation. Sinuses: Dependent hemorrhage is noted in the maxillary sinuses, right greater than left, as well as the right sphenoid sinus. Remaining sinuses are clear.  The visualized mastoid air cells and middle ear cavities are clear. Soft tissues: Small amounts of soft tissue air is seen adjacent to the right maxillary sinus and along the right cheek and adjacent to the left lateral orbit and lateral maxillary sinus. No soft tissue masses or adenopathy. CT CERVICAL SPINE FINDINGS Alignment: Normal. Skull base and vertebrae: There is a fracture, nondisplaced, of the anterior inferior corner of the C2 vertebra without associated soft tissue swelling. There is a questionable acute fracture of the distal aspect of the C3 spinous process, most suggested on the sagittal view, also with no soft tissue swelling. No other evidence of a fracture. No osteoblastic or osteolytic lesions. Soft tissues and spinal canal: No spinal canal mass, herniated disc or hematoma. There is heterogeneous enlargement of the left lobe of the thyroid gland consistent with goiter. Disc levels: Moderate loss of disc height with endplate spurring at N3-I1. Facet degenerative changes noted bilaterally. No significant central stenosis or neural foraminal narrowing. Upper chest: Apical pleuroparenchymal scarring. No acute findings in the lung apices. Other: None. IMPRESSION: HEAD CT 1. No acute intracranial abnormalities. 2. No skull fracture. 3. Frontal scalp hematoma. MAXILLOFACIAL CT 1. Mildly depressed comminuted fracture of the right orbital floor. 2. Subtle fracture of the right lateral maxillary sinus wall. Probable nondisplaced fracture of the left lateral maxillary sinus wall. Possible subtle fracture of  the lateral left orbital wall. 3. Dependent hemorrhage in the maxillary sinuses and right sphenoid sinus. CERVICAL CT 1. Small nondisplaced fracture along the anterior inferior corner of the C2 vertebra. This appears acute but there is no associated soft tissue swelling or hemorrhage. Fractures new since the prior CT. 2. Apparent fracture along the distal margin of the C3 spinous process, nondisplaced. 3. No other fractures or acute findings. Electronically Signed   By: Lajean Manes M.D.   On: 07/11/2016 21:56   Ct Cervical Spine Wo Contrast  Result Date: 07/11/2016 CLINICAL DATA:  Pt states she she "blacked out" and fell down 3-4 steps tonight. Pt currently c/o head and neck pain. Laceration to left forehead noted. EXAM: CT HEAD WITHOUT CONTRAST CT MAXILLOFACIAL WITHOUT CONTRAST CT CERVICAL SPINE WITHOUT CONTRAST TECHNIQUE: Multidetector CT imaging of the head, cervical spine, and maxillofacial structures were performed using the standard protocol without intravenous contrast. Multiplanar CT image reconstructions of the cervical spine and maxillofacial structures were also generated. COMPARISON:  05/14/2015 FINDINGS: CT HEAD FINDINGS Brain: No evidence of acute infarction, hemorrhage, hydrocephalus, extra-axial collection or mass lesion/mass effect. Vascular: No hyperdense vessel or unexpected calcification. Skull: Normal. Negative for fracture or focal lesion. Other: There is a frontal scalp hematoma just to the right of midline. CT MAXILLOFACIAL FINDINGS Osseous: There is a fracture, there is comminuted fracture of the right orbital floor. Along mid orbit, there is mild depression of the fracture, of 4 mm. There is no entrapment of the extra-ocular muscles. The fracture extends to the anterior infraorbital rim. There is a subtle fracture along the lateral right maxillary sinus wall. There is air along the lateral left maxillary sinus wall. There is no definite fracture, but 1 is suspected given the presence  of air as well as a small amount of non dependent hemorrhage in the sinus. There is no left orbital floor fracture. A subtle fracture of the lateral left orbital wall is suggested, nondisplaced. No other evidence of fractures.  No bone lesions. Orbits: No injury to either globe. No orbital hematoma or postseptal orbital inflammation. Sinuses: Dependent hemorrhage is noted in the  maxillary sinuses, right greater than left, as well as the right sphenoid sinus. Remaining sinuses are clear. The visualized mastoid air cells and middle ear cavities are clear. Soft tissues: Small amounts of soft tissue air is seen adjacent to the right maxillary sinus and along the right cheek and adjacent to the left lateral orbit and lateral maxillary sinus. No soft tissue masses or adenopathy. CT CERVICAL SPINE FINDINGS Alignment: Normal. Skull base and vertebrae: There is a fracture, nondisplaced, of the anterior inferior corner of the C2 vertebra without associated soft tissue swelling. There is a questionable acute fracture of the distal aspect of the C3 spinous process, most suggested on the sagittal view, also with no soft tissue swelling. No other evidence of a fracture. No osteoblastic or osteolytic lesions. Soft tissues and spinal canal: No spinal canal mass, herniated disc or hematoma. There is heterogeneous enlargement of the left lobe of the thyroid gland consistent with goiter. Disc levels: Moderate loss of disc height with endplate spurring at B1-Q9. Facet degenerative changes noted bilaterally. No significant central stenosis or neural foraminal narrowing. Upper chest: Apical pleuroparenchymal scarring. No acute findings in the lung apices. Other: None. IMPRESSION: HEAD CT 1. No acute intracranial abnormalities. 2. No skull fracture. 3. Frontal scalp hematoma. MAXILLOFACIAL CT 1. Mildly depressed comminuted fracture of the right orbital floor. 2. Subtle fracture of the right lateral maxillary sinus wall. Probable  nondisplaced fracture of the left lateral maxillary sinus wall. Possible subtle fracture of the lateral left orbital wall. 3. Dependent hemorrhage in the maxillary sinuses and right sphenoid sinus. CERVICAL CT 1. Small nondisplaced fracture along the anterior inferior corner of the C2 vertebra. This appears acute but there is no associated soft tissue swelling or hemorrhage. Fractures new since the prior CT. 2. Apparent fracture along the distal margin of the C3 spinous process, nondisplaced. 3. No other fractures or acute findings. Electronically Signed   By: Lajean Manes M.D.   On: 07/11/2016 21:56   Dg Knee Complete 4 Views Left  Result Date: 07/11/2016 CLINICAL DATA:  Fall; Pain in both knees, which have slight abrasions; laceration to forehead EXAM: LEFT KNEE - COMPLETE 4+ VIEW COMPARISON:  None. FINDINGS: There is a nondisplaced fracture of the patella with both a sagittal and transverse component. No other fractures.  No bone lesions. The knee joint is normally aligned. There is a joint effusion with a fat fluid level in the suprapatellar joint capsule. Bones are demineralized. IMPRESSION: 1. Nondisplaced patellar fracture with associated joint effusion with a fat fluid level. 2. No other fractures.  No dislocation. Electronically Signed   By: Lajean Manes M.D.   On: 07/11/2016 21:34   Dg Knee Complete 4 Views Right  Result Date: 07/11/2016 CLINICAL DATA:  Fall; Pain in both knees, which have slight abrasions; laceration to forehead EXAM: RIGHT KNEE - COMPLETE 4+ VIEW COMPARISON:  05/14/2015 FINDINGS: There is a small nondisplaced fracture from the superior pole of the patella. No other fractures. The joint is normally aligned. There is a joint effusion with a fat fluid level in the suprapatellar joint capsule. Bones are demineralized. There is a partly imaged soft tissue mass in the subcutaneous soft tissues of the anterior thigh. This is circumscribed relatively lucent. IMPRESSION: 1. Small  nondisplaced fracture from the superior pole of the patella with an associated joint effusion and fat fluid level. 2. Subcutaneous soft tissue mass along the anterior mid thigh, not included on the field of view from the prior study. Recommend followup  assessment on a nonemergent basis with ultrasound and/or CT with contrast. Electronically Signed   By: Lajean Manes M.D.   On: 07/11/2016 21:37   Ct Maxillofacial Wo Contrast  Result Date: 07/11/2016 CLINICAL DATA:  Pt states she she "blacked out" and fell down 3-4 steps tonight. Pt currently c/o head and neck pain. Laceration to left forehead noted. EXAM: CT HEAD WITHOUT CONTRAST CT MAXILLOFACIAL WITHOUT CONTRAST CT CERVICAL SPINE WITHOUT CONTRAST TECHNIQUE: Multidetector CT imaging of the head, cervical spine, and maxillofacial structures were performed using the standard protocol without intravenous contrast. Multiplanar CT image reconstructions of the cervical spine and maxillofacial structures were also generated. COMPARISON:  05/14/2015 FINDINGS: CT HEAD FINDINGS Brain: No evidence of acute infarction, hemorrhage, hydrocephalus, extra-axial collection or mass lesion/mass effect. Vascular: No hyperdense vessel or unexpected calcification. Skull: Normal. Negative for fracture or focal lesion. Other: There is a frontal scalp hematoma just to the right of midline. CT MAXILLOFACIAL FINDINGS Osseous: There is a fracture, there is comminuted fracture of the right orbital floor. Along mid orbit, there is mild depression of the fracture, of 4 mm. There is no entrapment of the extra-ocular muscles. The fracture extends to the anterior infraorbital rim. There is a subtle fracture along the lateral right maxillary sinus wall. There is air along the lateral left maxillary sinus wall. There is no definite fracture, but 1 is suspected given the presence of air as well as a small amount of non dependent hemorrhage in the sinus. There is no left orbital floor fracture. A  subtle fracture of the lateral left orbital wall is suggested, nondisplaced. No other evidence of fractures.  No bone lesions. Orbits: No injury to either globe. No orbital hematoma or postseptal orbital inflammation. Sinuses: Dependent hemorrhage is noted in the maxillary sinuses, right greater than left, as well as the right sphenoid sinus. Remaining sinuses are clear. The visualized mastoid air cells and middle ear cavities are clear. Soft tissues: Small amounts of soft tissue air is seen adjacent to the right maxillary sinus and along the right cheek and adjacent to the left lateral orbit and lateral maxillary sinus. No soft tissue masses or adenopathy. CT CERVICAL SPINE FINDINGS Alignment: Normal. Skull base and vertebrae: There is a fracture, nondisplaced, of the anterior inferior corner of the C2 vertebra without associated soft tissue swelling. There is a questionable acute fracture of the distal aspect of the C3 spinous process, most suggested on the sagittal view, also with no soft tissue swelling. No other evidence of a fracture. No osteoblastic or osteolytic lesions. Soft tissues and spinal canal: No spinal canal mass, herniated disc or hematoma. There is heterogeneous enlargement of the left lobe of the thyroid gland consistent with goiter. Disc levels: Moderate loss of disc height with endplate spurring at S2-A7. Facet degenerative changes noted bilaterally. No significant central stenosis or neural foraminal narrowing. Upper chest: Apical pleuroparenchymal scarring. No acute findings in the lung apices. Other: None. IMPRESSION: HEAD CT 1. No acute intracranial abnormalities. 2. No skull fracture. 3. Frontal scalp hematoma. MAXILLOFACIAL CT 1. Mildly depressed comminuted fracture of the right orbital floor. 2. Subtle fracture of the right lateral maxillary sinus wall. Probable nondisplaced fracture of the left lateral maxillary sinus wall. Possible subtle fracture of the lateral left orbital wall. 3.  Dependent hemorrhage in the maxillary sinuses and right sphenoid sinus. CERVICAL CT 1. Small nondisplaced fracture along the anterior inferior corner of the C2 vertebra. This appears acute but there is no associated soft tissue swelling or  hemorrhage. Fractures new since the prior CT. 2. Apparent fracture along the distal margin of the C3 spinous process, nondisplaced. 3. No other fractures or acute findings. Electronically Signed   By: Lajean Manes M.D.   On: 07/11/2016 21:56    Review of Systems  Constitutional: Positive for malaise/fatigue.  HENT: Negative for hearing loss and tinnitus.   Eyes: Negative for blurred vision and double vision.  Respiratory: Negative for cough.   Cardiovascular: Negative for chest pain and palpitations.  Gastrointestinal: Positive for heartburn. Negative for abdominal pain.  Genitourinary: Negative for dysuria and urgency.  Musculoskeletal: Positive for back pain, falls and neck pain.  Skin: Negative for itching and rash.  Neurological: Positive for dizziness, loss of consciousness and weakness.  Endo/Heme/Allergies: Bruises/bleeds easily.  Psychiatric/Behavioral: Positive for depression. Negative for suicidal ideas.    Blood pressure (!) 180/77, pulse (!) 57, temperature 98.4 F (36.9 C), temperature source Oral, resp. rate 12, height 5' 9.5" (1.765 m), weight 81.6 kg (180 lb), SpO2 100 %. Physical Exam  Constitutional: She is oriented to person, place, and time. She appears well-developed and well-nourished.  HENT:  Head:    Eyes: Pupils are equal, round, and reactive to light. No scleral icterus.  Neck:  c collar  Tender over cervical spine   Cardiovascular: Normal rate and regular rhythm.   Respiratory: Effort normal and breath sounds normal. She exhibits tenderness.  GI: Soft. Bowel sounds are normal. She exhibits distension. There is tenderness. There is rebound.  Musculoskeletal:  Swelling and bruising over both knees      Back non  tender   Neurological: She is alert and oriented to person, place, and time. She has normal strength. No sensory deficit. GCS eye subscore is 4. GCS verbal subscore is 5. GCS motor subscore is 6.  Skin: Skin is warm and dry.  Psychiatric: She has a normal mood and affect. Her speech is normal and behavior is normal. Cognition and memory are impaired.     Assessment/Plan Fall Question syncopal episode - consult medicine for assistance with HTN, dementia and work up of syncope  C2 &  C3 fracture NSU recommended C collar  Neurologically intact  Right orbital wall fracture without impingement and facial laceration  Plastics consulted    Bilateral patellar fractures  Ortho consulted by ED  6 th rib fracture   Minimal pain  Pulmonary toilet    Kameron Blethen A. 07/11/2016, 10:58 PM   Procedures

## 2016-07-11 NOTE — ED Notes (Signed)
Pt not back from imaging.  Will collect labs when pt back in room.

## 2016-07-11 NOTE — ED Triage Notes (Signed)
Pt reports to this writer that she, "Blacked out."  Prior to fall.

## 2016-07-11 NOTE — Consult Note (Signed)
Received call for consult from Montine Circle, PA-C in regards to patient Pt had mechanical fall earlier today and suffered multiple injuries Of those injuries, she has a small nondisplaced fx along anterior inferior corner of C2 and nondisplaced fx distal margin of C3 spinous process. Reportedly neuro at baseline (hx of dementia) No surgical intervention indicated C-collar for sx relief/support Will see patient in am

## 2016-07-11 NOTE — ED Notes (Signed)
Patient transported to X-ray 

## 2016-07-11 NOTE — ED Triage Notes (Signed)
Pt bib [facility] from home s/p fall.  Per EMS pt has a laceration to her forehead, skin tear to left arm and is c/o pain to left knee and neck pain.  Per EMS pt reported that she missed the last few steps going into the basement causing fall.  Pt denies LOC however pt has dementia and fall was unwitnessed.

## 2016-07-11 NOTE — ED Notes (Signed)
EDP at bedside suturing laceration.  Report called to Ivin Booty, Therapist, sports.  Will take pt up one EDP is finished.

## 2016-07-11 NOTE — ED Provider Notes (Signed)
[facility] DEPT Provider Note   CSN: [id number] Arrival date & time: 07/11/16  1930     History   Chief Complaint Chief Complaint  Patient presents with  . Fall    HPI Teresa Francis is a 76 y.o. female.  Patient with past medical history remarkable for Alzheimer's disease, GERD, hyperlipidemia, and hypertension. She is not anticoagulated. Patient presents with a fall down several stairs. It is unclear whether the patient syncopized or stumbled. The follows unwitnessed. Patient cannot recall the events leading up to the accident. She complains of pain in her face, neck, right back, and bilateral knees. She cannot ambulate. She has not taken anything for pain. She denies any chest pain, shortness of breath, or abdominal pain. Her last tetanus shot is unknown. She lives at home with her daughter.   The history is provided by the patient. No language interpreter was used.    Past Medical History:  Diagnosis Date  . Alzheimer disease   . Depression   . GERD (gastroesophageal reflux disease)   . Hyperlipidemia   . Hypertension     Patient Active Problem List   Diagnosis Date Noted  . B12 deficiency 10/23/2015  . Closed fracture of distal end of right radius 05/30/2015  . Osteopenia 10/25/2013  . Gastroesophageal reflux disease without esophagitis 09/20/2013  . HLD (hyperlipidemia) 09/20/2013  . Essential hypertension 09/20/2013  . Alzheimer's dementia 09/20/2013  . Depression 09/20/2013    Past Surgical History:  Procedure Laterality Date  . CHOLECYSTECTOMY  2016  . TUBAL LIGATION  1978    OB History    No data available       Home Medications    Prior to Admission medications   Medication Sig Start Date End Date Taking? Authorizing Provider  aspirin 81 MG tablet Take 81 mg by mouth daily.   Yes Historical Provider, MD  cholecalciferol (VITAMIN D) 1000 units tablet Take 1,000 Units by mouth daily.   Yes Historical Provider, MD  donepezil (ARICEPT) 10 MG  tablet TAKE 1 TABLET AT BEDTIME 11/08/15  Yes Jearld Fenton, NP  ibandronate (BONIVA) 150 MG tablet Take 1 tablet (150 mg total) by mouth every 30 (thirty) days. Take in the morning with a full glass of water, on an empty stomach, and do not take anything else by mouth or lie down for the next 30 min. 01/07/16  Yes Jearld Fenton, NP  Melatonin 5 MG TABS Take 1 tablet by mouth at bedtime and may repeat dose one time if needed.    Yes Historical Provider, MD  memantine (NAMENDA) 5 MG tablet TAKE 1 TABLET TWICE DAILY 04/07/16  Yes Jearld Fenton, NP  pantoprazole (PROTONIX) 40 MG tablet Take 1 tablet (40 mg total) by mouth 2 (two) times daily. 11/06/15  Yes Jearld Fenton, NP  sertraline (ZOLOFT) 50 MG tablet TAKE 1 TABLET EVERY DAY 04/07/16  Yes Jearld Fenton, NP  simvastatin (ZOCOR) 40 MG tablet TAKE 1 TABLET DAILY AT 6 PM 02/12/16  Yes Jearld Fenton, NP  vitamin B-12 (CYANOCOBALAMIN) 1000 MCG tablet Take 1,000 mcg by mouth daily.   Yes Historical Provider, MD  vitamin E 400 UNIT capsule Take 400 Units by mouth daily.   Yes Historical Provider, MD    Family History Family History  Problem Relation Age of Onset  . Heart disease Mother   . Hypertension Mother   . Arthritis Father   . Cancer Father     Prostate  . Cancer  Son     Lung  . Cancer Sister     Breast  . Arthritis Sister   . Cancer Brother     Throat  . Hypertension Daughter   . Diabetes Paternal Aunt   . Diabetes Paternal Uncle   . Diabetes Paternal Grandfather   . Cancer Sister     Breast  . Arthritis Sister   . Cancer Sister     Breast  . Arthritis Sister   . Cancer Brother     Prostate  . Diabetes Daughter     Uterine  . Stroke Neg Hx     Social History Social History  Substance Use Topics  . Smoking status: Never Smoker  . Smokeless tobacco: Never Used  . Alcohol use No     Allergies   Lisinopril   Review of Systems Review of Systems  All other systems reviewed and are negative.    Physical  Exam Updated Vital Signs BP (!) 180/77   Pulse (!) 57   Temp 98.4 F (36.9 C) (Oral)   Resp 12   Ht 5' 9.5" (1.765 m)   Wt 81.6 kg   SpO2 100%   BMI 26.20 kg/m   Physical Exam  Constitutional: She is oriented to person, place, and time. She appears well-developed and well-nourished.  HENT:  Head: Normocephalic and atraumatic.  Tenderness to palpation of bilateral orbits, no evidence of entrapment, normal sensation, no evidence of intraoral trauma, no septal hematoma  Eyes: Conjunctivae and EOM are normal. Pupils are equal, round, and reactive to light.  Neck: Normal range of motion. Neck supple.  Cardiovascular: Normal rate and regular rhythm.  Exam reveals no gallop and no friction rub.   No murmur heard. Pulmonary/Chest: Effort normal and breath sounds normal. No respiratory distress. She has no wheezes. She has no rales. She exhibits no tenderness.  Abdominal: Soft. Bowel sounds are normal. She exhibits no distension and no mass. There is no tenderness. There is no rebound and no guarding.  Musculoskeletal: Normal range of motion. She exhibits no edema or tenderness.  Patient c-collar Right side back is tender to palpation without bony step-off or deformity of the T or L-spine.  Bilateral knees are tender to palpation anteriorly with mild contusion and mild swelling, range of motion and strength are deferred secondary to pain  Bilateral hips are nontender to palpation and are stable on exam    Neurological: She is alert and oriented to person, place, and time.  Skin: Skin is warm and dry.  4 cm linear laceration to forehead, no foreign body  Psychiatric: She has a normal mood and affect. Her behavior is normal. Judgment and thought content normal.  Nursing note and vitals reviewed.    ED Treatments / Results  Labs (all labs ordered are listed, but only abnormal results are displayed) Labs Reviewed  CBC WITH DIFFERENTIAL/PLATELET - Abnormal; Notable for the following:         Result Value   WBC 15.1 (*)    Neutro Abs 12.0 (*)    All other components within normal limits  BASIC METABOLIC PANEL - Abnormal; Notable for the following:    Glucose, Bld 135 (*)    Creatinine, Ser 1.03 (*)    GFR calc non Af Amer 51 (*)    GFR calc Af Amer 59 (*)    All other components within normal limits  URINALYSIS, ROUTINE W REFLEX MICROSCOPIC  I-STAT TROPOININ, ED  CBG MONITORING, ED    EKG  EKG Interpretation None       Radiology Dg Chest 1 View  Result Date: 07/11/2016 CLINICAL DATA:  Pain after fall EXAM: CHEST 1 VIEW COMPARISON:  None. FINDINGS: The heart size and mediastinal contours are within normal limits. Minimal biapical pleuroparenchymal scarring. Subtle irregularity of the posterior right sixth rib is suggested which may represent a nondisplaced fracture. This area is difficult to assess due to adjacent atelectatic lung. No pneumothorax is seen. No effusion. IMPRESSION: Subtle lucency is suggested of the posterior right sixth rib with adjacent atelectasis the may represent a nondisplaced fracture. Otherwise negative exam. Electronically Signed   By: Ashley Royalty M.D.   On: 07/11/2016 21:29   Ct Head Wo Contrast  Result Date: 07/11/2016 CLINICAL DATA:  Pt states she she "blacked out" and fell down 3-4 steps tonight. Pt currently c/o head and neck pain. Laceration to left forehead noted. EXAM: CT HEAD WITHOUT CONTRAST CT MAXILLOFACIAL WITHOUT CONTRAST CT CERVICAL SPINE WITHOUT CONTRAST TECHNIQUE: Multidetector CT imaging of the head, cervical spine, and maxillofacial structures were performed using the standard protocol without intravenous contrast. Multiplanar CT image reconstructions of the cervical spine and maxillofacial structures were also generated. COMPARISON:  05/14/2015 FINDINGS: CT HEAD FINDINGS Brain: No evidence of acute infarction, hemorrhage, hydrocephalus, extra-axial collection or mass lesion/mass effect. Vascular: No hyperdense vessel or  unexpected calcification. Skull: Normal. Negative for fracture or focal lesion. Other: There is a frontal scalp hematoma just to the right of midline. CT MAXILLOFACIAL FINDINGS Osseous: There is a fracture, there is comminuted fracture of the right orbital floor. Along mid orbit, there is mild depression of the fracture, of 4 mm. There is no entrapment of the extra-ocular muscles. The fracture extends to the anterior infraorbital rim. There is a subtle fracture along the lateral right maxillary sinus wall. There is air along the lateral left maxillary sinus wall. There is no definite fracture, but 1 is suspected given the presence of air as well as a small amount of non dependent hemorrhage in the sinus. There is no left orbital floor fracture. A subtle fracture of the lateral left orbital wall is suggested, nondisplaced. No other evidence of fractures.  No bone lesions. Orbits: No injury to either globe. No orbital hematoma or postseptal orbital inflammation. Sinuses: Dependent hemorrhage is noted in the maxillary sinuses, right greater than left, as well as the right sphenoid sinus. Remaining sinuses are clear. The visualized mastoid air cells and middle ear cavities are clear. Soft tissues: Small amounts of soft tissue air is seen adjacent to the right maxillary sinus and along the right cheek and adjacent to the left lateral orbit and lateral maxillary sinus. No soft tissue masses or adenopathy. CT CERVICAL SPINE FINDINGS Alignment: Normal. Skull base and vertebrae: There is a fracture, nondisplaced, of the anterior inferior corner of the C2 vertebra without associated soft tissue swelling. There is a questionable acute fracture of the distal aspect of the C3 spinous process, most suggested on the sagittal view, also with no soft tissue swelling. No other evidence of a fracture. No osteoblastic or osteolytic lesions. Soft tissues and spinal canal: No spinal canal mass, herniated disc or hematoma. There is  heterogeneous enlargement of the left lobe of the thyroid gland consistent with goiter. Disc levels: Moderate loss of disc height with endplate spurring at T4-H9. Facet degenerative changes noted bilaterally. No significant central stenosis or neural foraminal narrowing. Upper chest: Apical pleuroparenchymal scarring. No acute findings in the lung apices. Other: None. IMPRESSION: HEAD CT 1.  No acute intracranial abnormalities. 2. No skull fracture. 3. Frontal scalp hematoma. MAXILLOFACIAL CT 1. Mildly depressed comminuted fracture of the right orbital floor. 2. Subtle fracture of the right lateral maxillary sinus wall. Probable nondisplaced fracture of the left lateral maxillary sinus wall. Possible subtle fracture of the lateral left orbital wall. 3. Dependent hemorrhage in the maxillary sinuses and right sphenoid sinus. CERVICAL CT 1. Small nondisplaced fracture along the anterior inferior corner of the C2 vertebra. This appears acute but there is no associated soft tissue swelling or hemorrhage. Fractures new since the prior CT. 2. Apparent fracture along the distal margin of the C3 spinous process, nondisplaced. 3. No other fractures or acute findings. Electronically Signed   By: Lajean Manes M.D.   On: 07/11/2016 21:56   Ct Cervical Spine Wo Contrast  Result Date: 07/11/2016 CLINICAL DATA:  Pt states she she "blacked out" and fell down 3-4 steps tonight. Pt currently c/o head and neck pain. Laceration to left forehead noted. EXAM: CT HEAD WITHOUT CONTRAST CT MAXILLOFACIAL WITHOUT CONTRAST CT CERVICAL SPINE WITHOUT CONTRAST TECHNIQUE: Multidetector CT imaging of the head, cervical spine, and maxillofacial structures were performed using the standard protocol without intravenous contrast. Multiplanar CT image reconstructions of the cervical spine and maxillofacial structures were also generated. COMPARISON:  05/14/2015 FINDINGS: CT HEAD FINDINGS Brain: No evidence of acute infarction, hemorrhage,  hydrocephalus, extra-axial collection or mass lesion/mass effect. Vascular: No hyperdense vessel or unexpected calcification. Skull: Normal. Negative for fracture or focal lesion. Other: There is a frontal scalp hematoma just to the right of midline. CT MAXILLOFACIAL FINDINGS Osseous: There is a fracture, there is comminuted fracture of the right orbital floor. Along mid orbit, there is mild depression of the fracture, of 4 mm. There is no entrapment of the extra-ocular muscles. The fracture extends to the anterior infraorbital rim. There is a subtle fracture along the lateral right maxillary sinus wall. There is air along the lateral left maxillary sinus wall. There is no definite fracture, but 1 is suspected given the presence of air as well as a small amount of non dependent hemorrhage in the sinus. There is no left orbital floor fracture. A subtle fracture of the lateral left orbital wall is suggested, nondisplaced. No other evidence of fractures.  No bone lesions. Orbits: No injury to either globe. No orbital hematoma or postseptal orbital inflammation. Sinuses: Dependent hemorrhage is noted in the maxillary sinuses, right greater than left, as well as the right sphenoid sinus. Remaining sinuses are clear. The visualized mastoid air cells and middle ear cavities are clear. Soft tissues: Small amounts of soft tissue air is seen adjacent to the right maxillary sinus and along the right cheek and adjacent to the left lateral orbit and lateral maxillary sinus. No soft tissue masses or adenopathy. CT CERVICAL SPINE FINDINGS Alignment: Normal. Skull base and vertebrae: There is a fracture, nondisplaced, of the anterior inferior corner of the C2 vertebra without associated soft tissue swelling. There is a questionable acute fracture of the distal aspect of the C3 spinous process, most suggested on the sagittal view, also with no soft tissue swelling. No other evidence of a fracture. No osteoblastic or osteolytic  lesions. Soft tissues and spinal canal: No spinal canal mass, herniated disc or hematoma. There is heterogeneous enlargement of the left lobe of the thyroid gland consistent with goiter. Disc levels: Moderate loss of disc height with endplate spurring at J6-R6. Facet degenerative changes noted bilaterally. No significant central stenosis or neural foraminal narrowing. Upper  chest: Apical pleuroparenchymal scarring. No acute findings in the lung apices. Other: None. IMPRESSION: HEAD CT 1. No acute intracranial abnormalities. 2. No skull fracture. 3. Frontal scalp hematoma. MAXILLOFACIAL CT 1. Mildly depressed comminuted fracture of the right orbital floor. 2. Subtle fracture of the right lateral maxillary sinus wall. Probable nondisplaced fracture of the left lateral maxillary sinus wall. Possible subtle fracture of the lateral left orbital wall. 3. Dependent hemorrhage in the maxillary sinuses and right sphenoid sinus. CERVICAL CT 1. Small nondisplaced fracture along the anterior inferior corner of the C2 vertebra. This appears acute but there is no associated soft tissue swelling or hemorrhage. Fractures new since the prior CT. 2. Apparent fracture along the distal margin of the C3 spinous process, nondisplaced. 3. No other fractures or acute findings. Electronically Signed   By: Lajean Manes M.D.   On: 07/11/2016 21:56   Dg Knee Complete 4 Views Left  Result Date: 07/11/2016 CLINICAL DATA:  Fall; Pain in both knees, which have slight abrasions; laceration to forehead EXAM: LEFT KNEE - COMPLETE 4+ VIEW COMPARISON:  None. FINDINGS: There is a nondisplaced fracture of the patella with both a sagittal and transverse component. No other fractures.  No bone lesions. The knee joint is normally aligned. There is a joint effusion with a fat fluid level in the suprapatellar joint capsule. Bones are demineralized. IMPRESSION: 1. Nondisplaced patellar fracture with associated joint effusion with a fat fluid level. 2. No  other fractures.  No dislocation. Electronically Signed   By: Lajean Manes M.D.   On: 07/11/2016 21:34   Dg Knee Complete 4 Views Right  Result Date: 07/11/2016 CLINICAL DATA:  Fall; Pain in both knees, which have slight abrasions; laceration to forehead EXAM: RIGHT KNEE - COMPLETE 4+ VIEW COMPARISON:  05/14/2015 FINDINGS: There is a small nondisplaced fracture from the superior pole of the patella. No other fractures. The joint is normally aligned. There is a joint effusion with a fat fluid level in the suprapatellar joint capsule. Bones are demineralized. There is a partly imaged soft tissue mass in the subcutaneous soft tissues of the anterior thigh. This is circumscribed relatively lucent. IMPRESSION: 1. Small nondisplaced fracture from the superior pole of the patella with an associated joint effusion and fat fluid level. 2. Subcutaneous soft tissue mass along the anterior mid thigh, not included on the field of view from the prior study. Recommend followup assessment on a nonemergent basis with ultrasound and/or CT with contrast. Electronically Signed   By: Lajean Manes M.D.   On: 07/11/2016 21:37   Ct Maxillofacial Wo Contrast  Result Date: 07/11/2016 CLINICAL DATA:  Pt states she she "blacked out" and fell down 3-4 steps tonight. Pt currently c/o head and neck pain. Laceration to left forehead noted. EXAM: CT HEAD WITHOUT CONTRAST CT MAXILLOFACIAL WITHOUT CONTRAST CT CERVICAL SPINE WITHOUT CONTRAST TECHNIQUE: Multidetector CT imaging of the head, cervical spine, and maxillofacial structures were performed using the standard protocol without intravenous contrast. Multiplanar CT image reconstructions of the cervical spine and maxillofacial structures were also generated. COMPARISON:  05/14/2015 FINDINGS: CT HEAD FINDINGS Brain: No evidence of acute infarction, hemorrhage, hydrocephalus, extra-axial collection or mass lesion/mass effect. Vascular: No hyperdense vessel or unexpected calcification.  Skull: Normal. Negative for fracture or focal lesion. Other: There is a frontal scalp hematoma just to the right of midline. CT MAXILLOFACIAL FINDINGS Osseous: There is a fracture, there is comminuted fracture of the right orbital floor. Along mid orbit, there is mild depression of the fracture,  of 4 mm. There is no entrapment of the extra-ocular muscles. The fracture extends to the anterior infraorbital rim. There is a subtle fracture along the lateral right maxillary sinus wall. There is air along the lateral left maxillary sinus wall. There is no definite fracture, but 1 is suspected given the presence of air as well as a small amount of non dependent hemorrhage in the sinus. There is no left orbital floor fracture. A subtle fracture of the lateral left orbital wall is suggested, nondisplaced. No other evidence of fractures.  No bone lesions. Orbits: No injury to either globe. No orbital hematoma or postseptal orbital inflammation. Sinuses: Dependent hemorrhage is noted in the maxillary sinuses, right greater than left, as well as the right sphenoid sinus. Remaining sinuses are clear. The visualized mastoid air cells and middle ear cavities are clear. Soft tissues: Small amounts of soft tissue air is seen adjacent to the right maxillary sinus and along the right cheek and adjacent to the left lateral orbit and lateral maxillary sinus. No soft tissue masses or adenopathy. CT CERVICAL SPINE FINDINGS Alignment: Normal. Skull base and vertebrae: There is a fracture, nondisplaced, of the anterior inferior corner of the C2 vertebra without associated soft tissue swelling. There is a questionable acute fracture of the distal aspect of the C3 spinous process, most suggested on the sagittal view, also with no soft tissue swelling. No other evidence of a fracture. No osteoblastic or osteolytic lesions. Soft tissues and spinal canal: No spinal canal mass, herniated disc or hematoma. There is heterogeneous enlargement of the  left lobe of the thyroid gland consistent with goiter. Disc levels: Moderate loss of disc height with endplate spurring at A2-N0. Facet degenerative changes noted bilaterally. No significant central stenosis or neural foraminal narrowing. Upper chest: Apical pleuroparenchymal scarring. No acute findings in the lung apices. Other: None. IMPRESSION: HEAD CT 1. No acute intracranial abnormalities. 2. No skull fracture. 3. Frontal scalp hematoma. MAXILLOFACIAL CT 1. Mildly depressed comminuted fracture of the right orbital floor. 2. Subtle fracture of the right lateral maxillary sinus wall. Probable nondisplaced fracture of the left lateral maxillary sinus wall. Possible subtle fracture of the lateral left orbital wall. 3. Dependent hemorrhage in the maxillary sinuses and right sphenoid sinus. CERVICAL CT 1. Small nondisplaced fracture along the anterior inferior corner of the C2 vertebra. This appears acute but there is no associated soft tissue swelling or hemorrhage. Fractures new since the prior CT. 2. Apparent fracture along the distal margin of the C3 spinous process, nondisplaced. 3. No other fractures or acute findings. Electronically Signed   By: Lajean Manes M.D.   On: 07/11/2016 21:56    Procedures Procedures (including critical care time) LACERATION REPAIR Performed by: Montine Circle Authorized by: Montine Circle Consent: Verbal consent obtained. Risks and benefits: risks, benefits and alternatives were discussed Consent given by: patient Patient identity confirmed: provided demographic data Prepped and Draped in normal sterile fashion Wound explored  Laceration Location: Forehead  Laceration Length: 4 cm  No Foreign Bodies seen or palpated  Anesthesia: local infiltration  Local anesthetic: lidocaine 1% with epinephrine  Anesthetic total: 4 ml  Irrigation method: syringe Amount of cleaning: standard  Skin closure: 5-0 vicryl rapide  Number of sutures: 6  Technique:  interrupted  Patient tolerance: Patient tolerated the procedure well with no immediate complications.  Medications Ordered in ED Medications  Tdap (BOOSTRIX) injection 0.5 mL (not administered)  lidocaine-EPINEPHrine (XYLOCAINE W/EPI) 2 %-1:200000 (PF) injection 20 mL (not administered)  Initial Impression / Assessment and Plan / ED Course  I have reviewed the triage vital signs and the nursing notes.  Pertinent labs & imaging results that were available during my care of the patient were reviewed by me and considered in my medical decision making (see chart for details).     Patient seen by and discussed with Dr. Jeneen Rinks, who recommends not making patient a leveled trauma at this time.  Imaging as above.  Discussed and reviewed with Dr. Jeneen Rinks, who recommends admission to trauma.  Unclear whether the patient's fall was mechanical or syncopal.  Patient cannot remember the incident.  EKG shows no concerning abnormality; reviewed by Dr. Jeneen Rinks.  CBG is normal.  Orthostatics not obtained due to fractures.  Troponin is normal.  Consultants: 1. Neurosurgery: Spoke with PA on-call for Dr. Cyndy Freeze, who states no surgical intervention for C2-3 fractures.  Recommends Aspen collar.  Neurosurgery will see patient in AM.  2. Maxillofacial: Spoke with Dr. Merri Ray, who recommends liquid diet, good naso oral hygiene, will see patient in AM.  3. Orthopedics: Spoke with Dr. Erlinda Hong, who recommends bilateral knee immobilizers.  Will see patient in the AM.  4. Hospitalist: Spoke with Dr. Loleta Books, who will consult for possible syncope.  Final Clinical Impressions(s) / ED Diagnoses   Final diagnoses:  Fall, initial encounter  Other closed nondisplaced fracture of second cervical vertebra, initial encounter ([place])  Other closed nondisplaced fracture of third cervical vertebra, initial encounter ([place])  Closed fracture of one rib of right side, initial encounter  Closed nondisplaced fracture of  right patella, unspecified fracture morphology, initial encounter  Closed nondisplaced fracture of left patella, unspecified fracture morphology, initial encounter  Facial laceration, initial encounter  Closed bilateral fractures of orbits, initial encounter [facility])    New Prescriptions New Prescriptions   No medications on file     Elliotte Marsalis, PA-C 07/12/16 0011    Tanna Furry, MD 07/27/16 0001

## 2016-07-12 ENCOUNTER — Inpatient Hospital Stay (HOSPITAL_COMMUNITY): Payer: Medicare HMO

## 2016-07-12 ENCOUNTER — Encounter (HOSPITAL_COMMUNITY): Payer: Self-pay | Admitting: Plastic Surgery

## 2016-07-12 DIAGNOSIS — W108XXA Fall (on) (from) other stairs and steps, initial encounter: Secondary | ICD-10-CM

## 2016-07-12 DIAGNOSIS — S82001A Unspecified fracture of right patella, initial encounter for closed fracture: Secondary | ICD-10-CM

## 2016-07-12 DIAGNOSIS — S82035A Nondisplaced transverse fracture of left patella, initial encounter for closed fracture: Secondary | ICD-10-CM

## 2016-07-12 LAB — URINALYSIS, ROUTINE W REFLEX MICROSCOPIC
Bilirubin Urine: NEGATIVE
GLUCOSE, UA: NEGATIVE mg/dL
HGB URINE DIPSTICK: NEGATIVE
Ketones, ur: NEGATIVE mg/dL
LEUKOCYTES UA: NEGATIVE
Nitrite: NEGATIVE
PH: 6 (ref 5.0–8.0)
Protein, ur: NEGATIVE mg/dL
SPECIFIC GRAVITY, URINE: 1.009 (ref 1.005–1.030)

## 2016-07-12 LAB — CBC
HEMATOCRIT: 36.5 % (ref 36.0–46.0)
Hemoglobin: 11.9 g/dL — ABNORMAL LOW (ref 12.0–15.0)
MCH: 26.9 pg (ref 26.0–34.0)
MCHC: 32.6 g/dL (ref 30.0–36.0)
MCV: 82.4 fL (ref 78.0–100.0)
Platelets: 299 10*3/uL (ref 150–400)
RBC: 4.43 MIL/uL (ref 3.87–5.11)
RDW: 15.3 % (ref 11.5–15.5)
WBC: 11.7 10*3/uL — AB (ref 4.0–10.5)

## 2016-07-12 LAB — BASIC METABOLIC PANEL
ANION GAP: 9 (ref 5–15)
BUN: 17 mg/dL (ref 6–20)
CALCIUM: 9 mg/dL (ref 8.9–10.3)
CO2: 25 mmol/L (ref 22–32)
Chloride: 106 mmol/L (ref 101–111)
Creatinine, Ser: 0.93 mg/dL (ref 0.44–1.00)
GFR calc Af Amer: >60 mL/min (ref >60)
GFR calc non Af Amer: 58 mL/min — ABNORMAL LOW (ref >60)
GLUCOSE: 168 mg/dL — AB (ref 65–99)
Potassium: 3.9 mmol/L (ref 3.5–5.1)
Sodium: 140 mmol/L (ref 135–145)

## 2016-07-12 MED ORDER — ACETAMINOPHEN 325 MG PO TABS
650.0000 mg | ORAL_TABLET | Freq: Four times a day (QID) | ORAL | Status: DC | PRN
  Administered 2016-07-12 – 2016-07-16 (×5): 650 mg via ORAL
  Filled 2016-07-12 (×5): qty 2

## 2016-07-12 MED ORDER — DOCUSATE SODIUM 100 MG PO CAPS
100.0000 mg | ORAL_CAPSULE | Freq: Two times a day (BID) | ORAL | Status: DC
Start: 2016-07-12 — End: 2016-07-16
  Administered 2016-07-12 – 2016-07-16 (×9): 100 mg via ORAL
  Filled 2016-07-12 (×9): qty 1

## 2016-07-12 MED ORDER — OXYCODONE HCL 5 MG PO TABS
5.0000 mg | ORAL_TABLET | ORAL | Status: DC | PRN
  Administered 2016-07-12 – 2016-07-15 (×4): 5 mg via ORAL
  Filled 2016-07-12 (×5): qty 1

## 2016-07-12 MED ORDER — METHOCARBAMOL 500 MG PO TABS
500.0000 mg | ORAL_TABLET | Freq: Three times a day (TID) | ORAL | Status: DC | PRN
  Administered 2016-07-12 – 2016-07-15 (×3): 500 mg via ORAL
  Filled 2016-07-12 (×3): qty 1

## 2016-07-12 MED ORDER — HYDROMORPHONE HCL 1 MG/ML IJ SOLN: 0.5000 mg | Freq: Four times a day (QID) | INTRAMUSCULAR | Status: DC | PRN

## 2016-07-12 MED ORDER — LABETALOL HCL 200 MG PO TABS
200.0000 mg | ORAL_TABLET | Freq: Three times a day (TID) | ORAL | Status: DC | PRN
  Filled 2016-07-12: qty 1

## 2016-07-12 NOTE — Consult Note (Signed)
CC:  Chief Complaint  Patient presents with  . Fall    HPI: Teresa Francis is a 76 y.o. female with dementia who presented to ER after falling down several stairs. Pt unable to give reliable history due to dementia. Unclear whether she had a mechanical fall or syncopal episode. Does complain of facial pain, neck pain and back pain. She denies any neurological symptoms including motor/sensory deficits, numbness/tingling of extremities.  PMH: Past Medical History:  Diagnosis Date  . Alzheimer disease   . Depression   . GERD (gastroesophageal reflux disease)   . Hyperlipidemia   . Hypertension     PSH: Past Surgical History:  Procedure Laterality Date  . CHOLECYSTECTOMY  2016  . TUBAL LIGATION  1978    SH: Social History  Substance Use Topics  . Smoking status: Never Smoker  . Smokeless tobacco: Never Used  . Alcohol use No    MEDS: Prior to Admission medications   Medication Sig Start Date End Date Taking? Authorizing Provider  aspirin 81 MG tablet Take 81 mg by mouth daily.   Yes Historical Provider, MD  cholecalciferol (VITAMIN D) 1000 units tablet Take 1,000 Units by mouth daily.   Yes Historical Provider, MD  donepezil (ARICEPT) 10 MG tablet TAKE 1 TABLET AT BEDTIME 11/08/15  Yes Jearld Fenton, NP  ibandronate (BONIVA) 150 MG tablet Take 1 tablet (150 mg total) by mouth every 30 (thirty) days. Take in the morning with a full glass of water, on an empty stomach, and do not take anything else by mouth or lie down for the next 30 min. 01/07/16  Yes Jearld Fenton, NP  Melatonin 5 MG TABS Take 1 tablet by mouth at bedtime and may repeat dose one time if needed.    Yes Historical Provider, MD  memantine (NAMENDA) 5 MG tablet TAKE 1 TABLET TWICE DAILY 04/07/16  Yes Jearld Fenton, NP  pantoprazole (PROTONIX) 40 MG tablet Take 1 tablet (40 mg total) by mouth 2 (two) times daily. 11/06/15  Yes Jearld Fenton, NP  sertraline (ZOLOFT) 50 MG tablet TAKE 1 TABLET EVERY DAY 04/07/16   Yes Jearld Fenton, NP  simvastatin (ZOCOR) 40 MG tablet TAKE 1 TABLET DAILY AT 6 PM 02/12/16  Yes Jearld Fenton, NP  vitamin B-12 (CYANOCOBALAMIN) 1000 MCG tablet Take 1,000 mcg by mouth daily.   Yes Historical Provider, MD  vitamin E 400 UNIT capsule Take 400 Units by mouth daily.   Yes Historical Provider, MD    ALLERGY: Allergies  Allergen Reactions  . Lisinopril Cough    ROS: Review of Systems  Constitutional: Positive for malaise/fatigue.  HENT: Negative.   Respiratory: Negative.   Cardiovascular: Negative.   Gastrointestinal: Negative.   Genitourinary: Negative.   Musculoskeletal: Positive for back pain, falls, myalgias and neck pain.  Neurological: Positive for weakness. Negative for dizziness, tingling, tremors, sensory change, speech change, focal weakness, seizures, loss of consciousness and headaches.  Endo/Heme/Allergies: Bruises/bleeds easily.    Vitals:   07/11/16 2345 07/12/16 0035  BP: (!) 164/73 (!) 162/61  Pulse: (!) 59 (!) 57  Resp: 11 15  Temp:  98.6 F (37 C)   General appearance: NAD, multiple bruises on face Eyes: PERRL Cardiovascular: Regular rate and rhythm without murmurs, rubs, gallops. No edema or variciosities. Distal pulses normal. Pulmonary: Clear to auscultation Musculoskeletal:     Muscle tone upper extremities: Normal    Muscle tone lower extremities: Normal    Motor exam: Upper Extremities Deltoid  Bicep Tricep Grip  Right 5/5 5/5 5/5 5/5  Left 5/5 5/5 5/5 5/5   Lower Extremity IP Quad PF DF EHL  Right 5/5 5/5 5/5 5/5 5/5  Left 5/5 5/5 5/5 5/5 5/5   Neurological Awake, alert Speech fluent, appropriate CNII: Visual fields normal CNIII/IV/VI: EOMI CNV: Facial sensation normal CNVII: Symmetric, normal strength CNVIII: Grossly normal CNIX: Normal palate movement CNXI: Trap and SCM strength normal CN XII: Tongue protrusion normal Sensation grossly intact to LT  IMAGING: CERVICAL CT 1. Small nondisplaced fracture along the  anterior inferior corner of the C2 vertebra. This appears acute but there is no associated soft tissue swelling or hemorrhage. Fractures new since the prior CT. 2. Apparent fracture along the distal margin of the C3 spinous process, nondisplaced. 3. No other fractures or acute findings.  IMPRESSION/PLAN - 76 y.o. female with small nondisplaced fracture along anterior inferior corner of C2 and nondisplaced c3 spinous process fracture. She is neurologically intact and moves all extremities well. She denies radicular symptoms. Fractures are non-operative. Continue with neck brace for support. F/U outpt in 4 weeks to ensure improvement. Will sign off. Call for any concerns.

## 2016-07-12 NOTE — Progress Notes (Signed)
Patient ID: Teresa Francis, female   DOB: [birth year], 76 y.o.   MRN: [id number]  [facility] Surgery Progress Note     Subjective: CC- fall Awake and alert this morning. States that she blacked out before she fell yesterday. Complaining of mild pain in her neck and bilateral knees. Denies n/v, SOB, paresthesias, extremity weakness. Tolerating full liquids.  Lives at home with daughter and son in law.  Objective: Vital signs in last 24 hours: Temp:  [98.4 F (36.9 C)-99.3 F (37.4 C)] 99.3 F (37.4 C) (04/29 0510) Pulse Rate:  [41-68] 68 (04/29 0510) Resp:  [8-16] 16 (04/29 0510) BP: (135-189)/(58-94) 135/58 (04/29 0510) SpO2:  [88 %-100 %] 97 % (04/29 0510) Weight:  [180 lb (81.6 kg)-188 lb 0.8 oz (85.3 kg)] 188 lb 0.8 oz (85.3 kg) (04/29 0035) Last BM Date: 07/11/16  Intake/Output from previous day: 04/28 0701 - 04/29 0700 In: 810 [P.O.:360; I.V.:450] Out: -  Intake/Output this shift: No intake/output data recorded.  PE: Gen:  Alert, NAD, pleasant HEENT: 3-4cm laceration forehead closed with sutures, no erythema or drainage Card:  RRR, no M/G/R heard; 2+ DP pulse Pulm:  CTAB, no W/R/R, effort normal Abd: Soft, NT/ND, +BS, no HSM, no hernia Left knee: small effusion, no skin tear, quad firing, significant extensor lag with SLR Right knee: small effusion, no skin tear, quad firing, significant extensor lag with SLR Ext:  BUE/BLE SILT, grip strength 5/5, DF/PF 5/5  Lab Results:   Recent Labs  07/11/16 2127 07/12/16 0559  WBC 15.1* 11.7*  HGB 13.3 11.9*  HCT 40.8 36.5  PLT 324 299   BMET  Recent Labs  07/11/16 2127 07/12/16 0559  NA 138 140  K 3.8 3.9  CL 101 106  CO2 26 25  GLUCOSE 135* 168*  BUN 17 17  CREATININE 1.03* 0.93  CALCIUM 9.2 9.0   PT/INR No results for input(s): LABPROT, INR in the last 72 hours. CMP     Component Value Date/Time   NA 140 07/12/2016 0559   K 3.9 07/12/2016 0559   CL 106 07/12/2016 0559   CO2 25 07/12/2016  0559   GLUCOSE 168 (H) 07/12/2016 0559   BUN 17 07/12/2016 0559   CREATININE 0.93 07/12/2016 0559   CALCIUM 9.0 07/12/2016 0559   PROT 6.9 11/06/2015 1603   ALBUMIN 4.2 11/06/2015 1603   AST 19 11/06/2015 1603   ALT 10 11/06/2015 1603   ALKPHOS 94 11/06/2015 1603   BILITOT 0.4 11/06/2015 1603   GFRNONAA 58 (L) 07/12/2016 0559   GFRAA >60 07/12/2016 0559   Lipase  No results found for: LIPASE     Studies/Results: Dg Chest 1 View  Result Date: 07/11/2016 CLINICAL DATA:  Pain after fall EXAM: CHEST 1 VIEW COMPARISON:  None. FINDINGS: The heart size and mediastinal contours are within normal limits. Minimal biapical pleuroparenchymal scarring. Subtle irregularity of the posterior right sixth rib is suggested which may represent a nondisplaced fracture. This area is difficult to assess due to adjacent atelectatic lung. No pneumothorax is seen. No effusion. IMPRESSION: Subtle lucency is suggested of the posterior right sixth rib with adjacent atelectasis the may represent a nondisplaced fracture. Otherwise negative exam. Electronically Signed   By: Ashley Royalty M.D.   On: 07/11/2016 21:29   Ct Head Wo Contrast  Result Date: 07/11/2016 CLINICAL DATA:  Pt states she she "blacked out" and fell down 3-4 steps tonight. Pt currently c/o head and neck pain. Laceration to left forehead noted. EXAM: CT HEAD  WITHOUT CONTRAST CT MAXILLOFACIAL WITHOUT CONTRAST CT CERVICAL SPINE WITHOUT CONTRAST TECHNIQUE: Multidetector CT imaging of the head, cervical spine, and maxillofacial structures were performed using the standard protocol without intravenous contrast. Multiplanar CT image reconstructions of the cervical spine and maxillofacial structures were also generated. COMPARISON:  05/14/2015 FINDINGS: CT HEAD FINDINGS Brain: No evidence of acute infarction, hemorrhage, hydrocephalus, extra-axial collection or mass lesion/mass effect. Vascular: No hyperdense vessel or unexpected calcification. Skull: Normal.  Negative for fracture or focal lesion. Other: There is a frontal scalp hematoma just to the right of midline. CT MAXILLOFACIAL FINDINGS Osseous: There is a fracture, there is comminuted fracture of the right orbital floor. Along mid orbit, there is mild depression of the fracture, of 4 mm. There is no entrapment of the extra-ocular muscles. The fracture extends to the anterior infraorbital rim. There is a subtle fracture along the lateral right maxillary sinus wall. There is air along the lateral left maxillary sinus wall. There is no definite fracture, but 1 is suspected given the presence of air as well as a small amount of non dependent hemorrhage in the sinus. There is no left orbital floor fracture. A subtle fracture of the lateral left orbital wall is suggested, nondisplaced. No other evidence of fractures.  No bone lesions. Orbits: No injury to either globe. No orbital hematoma or postseptal orbital inflammation. Sinuses: Dependent hemorrhage is noted in the maxillary sinuses, right greater than left, as well as the right sphenoid sinus. Remaining sinuses are clear. The visualized mastoid air cells and middle ear cavities are clear. Soft tissues: Small amounts of soft tissue air is seen adjacent to the right maxillary sinus and along the right cheek and adjacent to the left lateral orbit and lateral maxillary sinus. No soft tissue masses or adenopathy. CT CERVICAL SPINE FINDINGS Alignment: Normal. Skull base and vertebrae: There is a fracture, nondisplaced, of the anterior inferior corner of the C2 vertebra without associated soft tissue swelling. There is a questionable acute fracture of the distal aspect of the C3 spinous process, most suggested on the sagittal view, also with no soft tissue swelling. No other evidence of a fracture. No osteoblastic or osteolytic lesions. Soft tissues and spinal canal: No spinal canal mass, herniated disc or hematoma. There is heterogeneous enlargement of the left lobe of  the thyroid gland consistent with goiter. Disc levels: Moderate loss of disc height with endplate spurring at X9-K2. Facet degenerative changes noted bilaterally. No significant central stenosis or neural foraminal narrowing. Upper chest: Apical pleuroparenchymal scarring. No acute findings in the lung apices. Other: None. IMPRESSION: HEAD CT 1. No acute intracranial abnormalities. 2. No skull fracture. 3. Frontal scalp hematoma. MAXILLOFACIAL CT 1. Mildly depressed comminuted fracture of the right orbital floor. 2. Subtle fracture of the right lateral maxillary sinus wall. Probable nondisplaced fracture of the left lateral maxillary sinus wall. Possible subtle fracture of the lateral left orbital wall. 3. Dependent hemorrhage in the maxillary sinuses and right sphenoid sinus. CERVICAL CT 1. Small nondisplaced fracture along the anterior inferior corner of the C2 vertebra. This appears acute but there is no associated soft tissue swelling or hemorrhage. Fractures new since the prior CT. 2. Apparent fracture along the distal margin of the C3 spinous process, nondisplaced. 3. No other fractures or acute findings. Electronically Signed   By: Lajean Manes M.D.   On: 07/11/2016 21:56   Ct Cervical Spine Wo Contrast  Result Date: 07/11/2016 CLINICAL DATA:  Pt states she she "blacked out" and fell down 3-4  steps tonight. Pt currently c/o head and neck pain. Laceration to left forehead noted. EXAM: CT HEAD WITHOUT CONTRAST CT MAXILLOFACIAL WITHOUT CONTRAST CT CERVICAL SPINE WITHOUT CONTRAST TECHNIQUE: Multidetector CT imaging of the head, cervical spine, and maxillofacial structures were performed using the standard protocol without intravenous contrast. Multiplanar CT image reconstructions of the cervical spine and maxillofacial structures were also generated. COMPARISON:  05/14/2015 FINDINGS: CT HEAD FINDINGS Brain: No evidence of acute infarction, hemorrhage, hydrocephalus, extra-axial collection or mass lesion/mass  effect. Vascular: No hyperdense vessel or unexpected calcification. Skull: Normal. Negative for fracture or focal lesion. Other: There is a frontal scalp hematoma just to the right of midline. CT MAXILLOFACIAL FINDINGS Osseous: There is a fracture, there is comminuted fracture of the right orbital floor. Along mid orbit, there is mild depression of the fracture, of 4 mm. There is no entrapment of the extra-ocular muscles. The fracture extends to the anterior infraorbital rim. There is a subtle fracture along the lateral right maxillary sinus wall. There is air along the lateral left maxillary sinus wall. There is no definite fracture, but 1 is suspected given the presence of air as well as a small amount of non dependent hemorrhage in the sinus. There is no left orbital floor fracture. A subtle fracture of the lateral left orbital wall is suggested, nondisplaced. No other evidence of fractures.  No bone lesions. Orbits: No injury to either globe. No orbital hematoma or postseptal orbital inflammation. Sinuses: Dependent hemorrhage is noted in the maxillary sinuses, right greater than left, as well as the right sphenoid sinus. Remaining sinuses are clear. The visualized mastoid air cells and middle ear cavities are clear. Soft tissues: Small amounts of soft tissue air is seen adjacent to the right maxillary sinus and along the right cheek and adjacent to the left lateral orbit and lateral maxillary sinus. No soft tissue masses or adenopathy. CT CERVICAL SPINE FINDINGS Alignment: Normal. Skull base and vertebrae: There is a fracture, nondisplaced, of the anterior inferior corner of the C2 vertebra without associated soft tissue swelling. There is a questionable acute fracture of the distal aspect of the C3 spinous process, most suggested on the sagittal view, also with no soft tissue swelling. No other evidence of a fracture. No osteoblastic or osteolytic lesions. Soft tissues and spinal canal: No spinal canal mass,  herniated disc or hematoma. There is heterogeneous enlargement of the left lobe of the thyroid gland consistent with goiter. Disc levels: Moderate loss of disc height with endplate spurring at F0-Y6. Facet degenerative changes noted bilaterally. No significant central stenosis or neural foraminal narrowing. Upper chest: Apical pleuroparenchymal scarring. No acute findings in the lung apices. Other: None. IMPRESSION: HEAD CT 1. No acute intracranial abnormalities. 2. No skull fracture. 3. Frontal scalp hematoma. MAXILLOFACIAL CT 1. Mildly depressed comminuted fracture of the right orbital floor. 2. Subtle fracture of the right lateral maxillary sinus wall. Probable nondisplaced fracture of the left lateral maxillary sinus wall. Possible subtle fracture of the lateral left orbital wall. 3. Dependent hemorrhage in the maxillary sinuses and right sphenoid sinus. CERVICAL CT 1. Small nondisplaced fracture along the anterior inferior corner of the C2 vertebra. This appears acute but there is no associated soft tissue swelling or hemorrhage. Fractures new since the prior CT. 2. Apparent fracture along the distal margin of the C3 spinous process, nondisplaced. 3. No other fractures or acute findings. Electronically Signed   By: Lajean Manes M.D.   On: 07/11/2016 21:56   Dg Chest [facility] 1 [address]  Result Date: 07/12/2016 CLINICAL DATA:  Pain following fall with multifocal trauma EXAM: PORTABLE CHEST 1 VIEW COMPARISON:  July 11, 2016 FINDINGS: No pneumothorax. No edema or consolidation. Heart size and pulmonary vascularity are normal. No adenopathy. There is a hiatal hernia evident. No convincing rib fracture appreciable on this current radiographic evaluation. IMPRESSION: No pneumothorax. No edema or consolidation. Stable cardiac silhouette. There is an apparent hiatal hernia. Electronically Signed   By: Lowella Grip III M.D.   On: 07/12/2016 08:10   Dg Knee Complete 4 Views Left  Result Date: 07/11/2016 CLINICAL  DATA:  Fall; Pain in both knees, which have slight abrasions; laceration to forehead EXAM: LEFT KNEE - COMPLETE 4+ VIEW COMPARISON:  None. FINDINGS: There is a nondisplaced fracture of the patella with both a sagittal and transverse component. No other fractures.  No bone lesions. The knee joint is normally aligned. There is a joint effusion with a fat fluid level in the suprapatellar joint capsule. Bones are demineralized. IMPRESSION: 1. Nondisplaced patellar fracture with associated joint effusion with a fat fluid level. 2. No other fractures.  No dislocation. Electronically Signed   By: Lajean Manes M.D.   On: 07/11/2016 21:34   Dg Knee Complete 4 Views Right  Result Date: 07/11/2016 CLINICAL DATA:  Fall; Pain in both knees, which have slight abrasions; laceration to forehead EXAM: RIGHT KNEE - COMPLETE 4+ VIEW COMPARISON:  05/14/2015 FINDINGS: There is a small nondisplaced fracture from the superior pole of the patella. No other fractures. The joint is normally aligned. There is a joint effusion with a fat fluid level in the suprapatellar joint capsule. Bones are demineralized. There is a partly imaged soft tissue mass in the subcutaneous soft tissues of the anterior thigh. This is circumscribed relatively lucent. IMPRESSION: 1. Small nondisplaced fracture from the superior pole of the patella with an associated joint effusion and fat fluid level. 2. Subcutaneous soft tissue mass along the anterior mid thigh, not included on the field of view from the prior study. Recommend followup assessment on a nonemergent basis with ultrasound and/or CT with contrast. Electronically Signed   By: Lajean Manes M.D.   On: 07/11/2016 21:37   Ct Maxillofacial Wo Contrast  Result Date: 07/11/2016 CLINICAL DATA:  Pt states she she "blacked out" and fell down 3-4 steps tonight. Pt currently c/o head and neck pain. Laceration to left forehead noted. EXAM: CT HEAD WITHOUT CONTRAST CT MAXILLOFACIAL WITHOUT CONTRAST CT  CERVICAL SPINE WITHOUT CONTRAST TECHNIQUE: Multidetector CT imaging of the head, cervical spine, and maxillofacial structures were performed using the standard protocol without intravenous contrast. Multiplanar CT image reconstructions of the cervical spine and maxillofacial structures were also generated. COMPARISON:  05/14/2015 FINDINGS: CT HEAD FINDINGS Brain: No evidence of acute infarction, hemorrhage, hydrocephalus, extra-axial collection or mass lesion/mass effect. Vascular: No hyperdense vessel or unexpected calcification. Skull: Normal. Negative for fracture or focal lesion. Other: There is a frontal scalp hematoma just to the right of midline. CT MAXILLOFACIAL FINDINGS Osseous: There is a fracture, there is comminuted fracture of the right orbital floor. Along mid orbit, there is mild depression of the fracture, of 4 mm. There is no entrapment of the extra-ocular muscles. The fracture extends to the anterior infraorbital rim. There is a subtle fracture along the lateral right maxillary sinus wall. There is air along the lateral left maxillary sinus wall. There is no definite fracture, but 1 is suspected given the presence of air as well as a small amount of non dependent  hemorrhage in the sinus. There is no left orbital floor fracture. A subtle fracture of the lateral left orbital wall is suggested, nondisplaced. No other evidence of fractures.  No bone lesions. Orbits: No injury to either globe. No orbital hematoma or postseptal orbital inflammation. Sinuses: Dependent hemorrhage is noted in the maxillary sinuses, right greater than left, as well as the right sphenoid sinus. Remaining sinuses are clear. The visualized mastoid air cells and middle ear cavities are clear. Soft tissues: Small amounts of soft tissue air is seen adjacent to the right maxillary sinus and along the right cheek and adjacent to the left lateral orbit and lateral maxillary sinus. No soft tissue masses or adenopathy. CT CERVICAL  SPINE FINDINGS Alignment: Normal. Skull base and vertebrae: There is a fracture, nondisplaced, of the anterior inferior corner of the C2 vertebra without associated soft tissue swelling. There is a questionable acute fracture of the distal aspect of the C3 spinous process, most suggested on the sagittal view, also with no soft tissue swelling. No other evidence of a fracture. No osteoblastic or osteolytic lesions. Soft tissues and spinal canal: No spinal canal mass, herniated disc or hematoma. There is heterogeneous enlargement of the left lobe of the thyroid gland consistent with goiter. Disc levels: Moderate loss of disc height with endplate spurring at J8-A4. Facet degenerative changes noted bilaterally. No significant central stenosis or neural foraminal narrowing. Upper chest: Apical pleuroparenchymal scarring. No acute findings in the lung apices. Other: None. IMPRESSION: HEAD CT 1. No acute intracranial abnormalities. 2. No skull fracture. 3. Frontal scalp hematoma. MAXILLOFACIAL CT 1. Mildly depressed comminuted fracture of the right orbital floor. 2. Subtle fracture of the right lateral maxillary sinus wall. Probable nondisplaced fracture of the left lateral maxillary sinus wall. Possible subtle fracture of the lateral left orbital wall. 3. Dependent hemorrhage in the maxillary sinuses and right sphenoid sinus. CERVICAL CT 1. Small nondisplaced fracture along the anterior inferior corner of the C2 vertebra. This appears acute but there is no associated soft tissue swelling or hemorrhage. Fractures new since the prior CT. 2. Apparent fracture along the distal margin of the C3 spinous process, nondisplaced. 3. No other fractures or acute findings. Electronically Signed   By: Lajean Manes M.D.   On: 07/11/2016 21:56    Anti-infectives: Anti-infectives    None       Assessment/Plan Fall Question syncopal episode - syncopal workup per internal medicine; tele monitoring, ECHO pending, consider event  monitor at discharge if nothing noted on telemetry during hospital stay C2 and C3 fractures - nonop per Dr. Cyndy Freeze, c-collar, f/u 4 weeks 6 th rib fracture - CXR today shows no PNX. pulmonary toilet/IS Bilateral patellar fractures - ortho consult pending Right orbital wall fracture without impingement and facial laceration - plastics consult pending  HTN - BP stable, labetalol PRN  Dementia - home meds HLD - simvastatin GERD - protonix  ID - none FEN - regular diet, add colace BID VTE - lovenox, SCDs  Plan - advance to regular diet. ortho and plastics consults pending. PT consult once ortho sees. Appreciate medicine assistance with syncopal workup. Labs in AM.   LOS: 1 day    Jerrye Beavers , [facility] Surgery 07/12/2016, 8:43 AM Pager: [phone] Consults: [phone] Mon-Fri 7:00 am-4:30 pm Sat-Sun 7:00 am-11:30 am

## 2016-07-12 NOTE — Consult Note (Signed)
ORTHOPAEDIC CONSULTATION  REQUESTING PHYSICIAN: Trauma Md, MD  Chief Complaint: Bilateral knee pain  HPI: Teresa Francis is a 76 y.o. female who presents with bilateral patella fracture status post mechanical fall down stairs. She is unsure of loss of consciousness. The fall was unwitnessed. She also complains of neck pain and head pain. The pain in her knees do not radiate. There are associated with swelling and bruising. Orthopedics was counseled.  Past Medical History:  Diagnosis Date  . Alzheimer disease   . Depression   . GERD (gastroesophageal reflux disease)   . Hyperlipidemia   . Hypertension    Past Surgical History:  Procedure Laterality Date  . CHOLECYSTECTOMY  2016  . TUBAL LIGATION  1978   Social History   Social History  . Marital status: Widowed    Spouse name: N/A  . Number of children: N/A  . Years of education: N/A   Social History Main Topics  . Smoking status: Never Smoker  . Smokeless tobacco: Never Used  . Alcohol use No  . Drug use: No  . Sexual activity: Not Asked   Other Topics Concern  . None   Social History Narrative  . None   Family History  Problem Relation Age of Onset  . Heart disease Mother   . Hypertension Mother   . Arthritis Father   . Cancer Father     Prostate  . Cancer Son     Lung  . Cancer Sister     Breast  . Arthritis Sister   . Cancer Brother     Throat  . Hypertension Daughter   . Diabetes Paternal Aunt   . Diabetes Paternal Uncle   . Diabetes Paternal Grandfather   . Cancer Sister     Breast  . Arthritis Sister   . Cancer Sister     Breast  . Arthritis Sister   . Cancer Brother     Prostate  . Diabetes Daughter     Uterine  . Stroke Neg Hx    - negative except otherwise stated in the family history section Allergies  Allergen Reactions  . Lisinopril Cough   Prior to Admission medications   Medication Sig Start Date End Date Taking? Authorizing Provider  aspirin 81 MG tablet Take 81 mg  by mouth daily.   Yes Historical Provider, MD  cholecalciferol (VITAMIN D) 1000 units tablet Take 1,000 Units by mouth daily.   Yes Historical Provider, MD  donepezil (ARICEPT) 10 MG tablet TAKE 1 TABLET AT BEDTIME 11/08/15  Yes Jearld Fenton, NP  ibandronate (BONIVA) 150 MG tablet Take 1 tablet (150 mg total) by mouth every 30 (thirty) days. Take in the morning with a full glass of water, on an empty stomach, and do not take anything else by mouth or lie down for the next 30 min. 01/07/16  Yes Jearld Fenton, NP  Melatonin 5 MG TABS Take 1 tablet by mouth at bedtime and may repeat dose one time if needed.    Yes Historical Provider, MD  memantine (NAMENDA) 5 MG tablet TAKE 1 TABLET TWICE DAILY 04/07/16  Yes Jearld Fenton, NP  pantoprazole (PROTONIX) 40 MG tablet Take 1 tablet (40 mg total) by mouth 2 (two) times daily. 11/06/15  Yes Jearld Fenton, NP  sertraline (ZOLOFT) 50 MG tablet TAKE 1 TABLET EVERY DAY 04/07/16  Yes Jearld Fenton, NP  simvastatin (ZOCOR) 40 MG tablet TAKE 1 TABLET DAILY AT 6 PM 02/12/16  Yes  Jearld Fenton, NP  vitamin B-12 (CYANOCOBALAMIN) 1000 MCG tablet Take 1,000 mcg by mouth daily.   Yes Historical Provider, MD  vitamin E 400 UNIT capsule Take 400 Units by mouth daily.   Yes Historical Provider, MD   Dg Chest 1 View  Result Date: 07/11/2016 CLINICAL DATA:  Pain after fall EXAM: CHEST 1 VIEW COMPARISON:  None. FINDINGS: The heart size and mediastinal contours are within normal limits. Minimal biapical pleuroparenchymal scarring. Subtle irregularity of the posterior right sixth rib is suggested which may represent a nondisplaced fracture. This area is difficult to assess due to adjacent atelectatic lung. No pneumothorax is seen. No effusion. IMPRESSION: Subtle lucency is suggested of the posterior right sixth rib with adjacent atelectasis the may represent a nondisplaced fracture. Otherwise negative exam. Electronically Signed   By: Ashley Royalty M.D.   On: 07/11/2016 21:29    Ct Head Wo Contrast  Result Date: 07/11/2016 CLINICAL DATA:  Pt states she she "blacked out" and fell down 3-4 steps tonight. Pt currently c/o head and neck pain. Laceration to left forehead noted. EXAM: CT HEAD WITHOUT CONTRAST CT MAXILLOFACIAL WITHOUT CONTRAST CT CERVICAL SPINE WITHOUT CONTRAST TECHNIQUE: Multidetector CT imaging of the head, cervical spine, and maxillofacial structures were performed using the standard protocol without intravenous contrast. Multiplanar CT image reconstructions of the cervical spine and maxillofacial structures were also generated. COMPARISON:  05/14/2015 FINDINGS: CT HEAD FINDINGS Brain: No evidence of acute infarction, hemorrhage, hydrocephalus, extra-axial collection or mass lesion/mass effect. Vascular: No hyperdense vessel or unexpected calcification. Skull: Normal. Negative for fracture or focal lesion. Other: There is a frontal scalp hematoma just to the right of midline. CT MAXILLOFACIAL FINDINGS Osseous: There is a fracture, there is comminuted fracture of the right orbital floor. Along mid orbit, there is mild depression of the fracture, of 4 mm. There is no entrapment of the extra-ocular muscles. The fracture extends to the anterior infraorbital rim. There is a subtle fracture along the lateral right maxillary sinus wall. There is air along the lateral left maxillary sinus wall. There is no definite fracture, but 1 is suspected given the presence of air as well as a small amount of non dependent hemorrhage in the sinus. There is no left orbital floor fracture. A subtle fracture of the lateral left orbital wall is suggested, nondisplaced. No other evidence of fractures.  No bone lesions. Orbits: No injury to either globe. No orbital hematoma or postseptal orbital inflammation. Sinuses: Dependent hemorrhage is noted in the maxillary sinuses, right greater than left, as well as the right sphenoid sinus. Remaining sinuses are clear. The visualized mastoid air cells  and middle ear cavities are clear. Soft tissues: Small amounts of soft tissue air is seen adjacent to the right maxillary sinus and along the right cheek and adjacent to the left lateral orbit and lateral maxillary sinus. No soft tissue masses or adenopathy. CT CERVICAL SPINE FINDINGS Alignment: Normal. Skull base and vertebrae: There is a fracture, nondisplaced, of the anterior inferior corner of the C2 vertebra without associated soft tissue swelling. There is a questionable acute fracture of the distal aspect of the C3 spinous process, most suggested on the sagittal view, also with no soft tissue swelling. No other evidence of a fracture. No osteoblastic or osteolytic lesions. Soft tissues and spinal canal: No spinal canal mass, herniated disc or hematoma. There is heterogeneous enlargement of the left lobe of the thyroid gland consistent with goiter. Disc levels: Moderate loss of disc height with endplate spurring  at C6-C7. Facet degenerative changes noted bilaterally. No significant central stenosis or neural foraminal narrowing. Upper chest: Apical pleuroparenchymal scarring. No acute findings in the lung apices. Other: None. IMPRESSION: HEAD CT 1. No acute intracranial abnormalities. 2. No skull fracture. 3. Frontal scalp hematoma. MAXILLOFACIAL CT 1. Mildly depressed comminuted fracture of the right orbital floor. 2. Subtle fracture of the right lateral maxillary sinus wall. Probable nondisplaced fracture of the left lateral maxillary sinus wall. Possible subtle fracture of the lateral left orbital wall. 3. Dependent hemorrhage in the maxillary sinuses and right sphenoid sinus. CERVICAL CT 1. Small nondisplaced fracture along the anterior inferior corner of the C2 vertebra. This appears acute but there is no associated soft tissue swelling or hemorrhage. Fractures new since the prior CT. 2. Apparent fracture along the distal margin of the C3 spinous process, nondisplaced. 3. No other fractures or acute  findings. Electronically Signed   By: Lajean Manes M.D.   On: 07/11/2016 21:56   Ct Cervical Spine Wo Contrast  Result Date: 07/11/2016 CLINICAL DATA:  Pt states she she "blacked out" and fell down 3-4 steps tonight. Pt currently c/o head and neck pain. Laceration to left forehead noted. EXAM: CT HEAD WITHOUT CONTRAST CT MAXILLOFACIAL WITHOUT CONTRAST CT CERVICAL SPINE WITHOUT CONTRAST TECHNIQUE: Multidetector CT imaging of the head, cervical spine, and maxillofacial structures were performed using the standard protocol without intravenous contrast. Multiplanar CT image reconstructions of the cervical spine and maxillofacial structures were also generated. COMPARISON:  05/14/2015 FINDINGS: CT HEAD FINDINGS Brain: No evidence of acute infarction, hemorrhage, hydrocephalus, extra-axial collection or mass lesion/mass effect. Vascular: No hyperdense vessel or unexpected calcification. Skull: Normal. Negative for fracture or focal lesion. Other: There is a frontal scalp hematoma just to the right of midline. CT MAXILLOFACIAL FINDINGS Osseous: There is a fracture, there is comminuted fracture of the right orbital floor. Along mid orbit, there is mild depression of the fracture, of 4 mm. There is no entrapment of the extra-ocular muscles. The fracture extends to the anterior infraorbital rim. There is a subtle fracture along the lateral right maxillary sinus wall. There is air along the lateral left maxillary sinus wall. There is no definite fracture, but 1 is suspected given the presence of air as well as a small amount of non dependent hemorrhage in the sinus. There is no left orbital floor fracture. A subtle fracture of the lateral left orbital wall is suggested, nondisplaced. No other evidence of fractures.  No bone lesions. Orbits: No injury to either globe. No orbital hematoma or postseptal orbital inflammation. Sinuses: Dependent hemorrhage is noted in the maxillary sinuses, right greater than left, as well as  the right sphenoid sinus. Remaining sinuses are clear. The visualized mastoid air cells and middle ear cavities are clear. Soft tissues: Small amounts of soft tissue air is seen adjacent to the right maxillary sinus and along the right cheek and adjacent to the left lateral orbit and lateral maxillary sinus. No soft tissue masses or adenopathy. CT CERVICAL SPINE FINDINGS Alignment: Normal. Skull base and vertebrae: There is a fracture, nondisplaced, of the anterior inferior corner of the C2 vertebra without associated soft tissue swelling. There is a questionable acute fracture of the distal aspect of the C3 spinous process, most suggested on the sagittal view, also with no soft tissue swelling. No other evidence of a fracture. No osteoblastic or osteolytic lesions. Soft tissues and spinal canal: No spinal canal mass, herniated disc or hematoma. There is heterogeneous enlargement of the left lobe  of the thyroid gland consistent with goiter. Disc levels: Moderate loss of disc height with endplate spurring at K0-U5. Facet degenerative changes noted bilaterally. No significant central stenosis or neural foraminal narrowing. Upper chest: Apical pleuroparenchymal scarring. No acute findings in the lung apices. Other: None. IMPRESSION: HEAD CT 1. No acute intracranial abnormalities. 2. No skull fracture. 3. Frontal scalp hematoma. MAXILLOFACIAL CT 1. Mildly depressed comminuted fracture of the right orbital floor. 2. Subtle fracture of the right lateral maxillary sinus wall. Probable nondisplaced fracture of the left lateral maxillary sinus wall. Possible subtle fracture of the lateral left orbital wall. 3. Dependent hemorrhage in the maxillary sinuses and right sphenoid sinus. CERVICAL CT 1. Small nondisplaced fracture along the anterior inferior corner of the C2 vertebra. This appears acute but there is no associated soft tissue swelling or hemorrhage. Fractures new since the prior CT. 2. Apparent fracture along the  distal margin of the C3 spinous process, nondisplaced. 3. No other fractures or acute findings. Electronically Signed   By: Lajean Manes M.D.   On: 07/11/2016 21:56   Dg Chest Port 1 View  Result Date: 07/12/2016 CLINICAL DATA:  Pain following fall with multifocal trauma EXAM: PORTABLE CHEST 1 VIEW COMPARISON:  July 11, 2016 FINDINGS: No pneumothorax. No edema or consolidation. Heart size and pulmonary vascularity are normal. No adenopathy. There is a hiatal hernia evident. No convincing rib fracture appreciable on this current radiographic evaluation. IMPRESSION: No pneumothorax. No edema or consolidation. Stable cardiac silhouette. There is an apparent hiatal hernia. Electronically Signed   By: Lowella Grip III M.D.   On: 07/12/2016 08:10   Dg Knee Complete 4 Views Left  Result Date: 07/11/2016 CLINICAL DATA:  Fall; Pain in both knees, which have slight abrasions; laceration to forehead EXAM: LEFT KNEE - COMPLETE 4+ VIEW COMPARISON:  None. FINDINGS: There is a nondisplaced fracture of the patella with both a sagittal and transverse component. No other fractures.  No bone lesions. The knee joint is normally aligned. There is a joint effusion with a fat fluid level in the suprapatellar joint capsule. Bones are demineralized. IMPRESSION: 1. Nondisplaced patellar fracture with associated joint effusion with a fat fluid level. 2. No other fractures.  No dislocation. Electronically Signed   By: Lajean Manes M.D.   On: 07/11/2016 21:34   Dg Knee Complete 4 Views Right  Result Date: 07/11/2016 CLINICAL DATA:  Fall; Pain in both knees, which have slight abrasions; laceration to forehead EXAM: RIGHT KNEE - COMPLETE 4+ VIEW COMPARISON:  05/14/2015 FINDINGS: There is a small nondisplaced fracture from the superior pole of the patella. No other fractures. The joint is normally aligned. There is a joint effusion with a fat fluid level in the suprapatellar joint capsule. Bones are demineralized. There is a  partly imaged soft tissue mass in the subcutaneous soft tissues of the anterior thigh. This is circumscribed relatively lucent. IMPRESSION: 1. Small nondisplaced fracture from the superior pole of the patella with an associated joint effusion and fat fluid level. 2. Subcutaneous soft tissue mass along the anterior mid thigh, not included on the field of view from the prior study. Recommend followup assessment on a nonemergent basis with ultrasound and/or CT with contrast. Electronically Signed   By: Lajean Manes M.D.   On: 07/11/2016 21:37   Ct Maxillofacial Wo Contrast  Result Date: 07/11/2016 CLINICAL DATA:  Pt states she she "blacked out" and fell down 3-4 steps tonight. Pt currently c/o head and neck pain. Laceration to left forehead  noted. EXAM: CT HEAD WITHOUT CONTRAST CT MAXILLOFACIAL WITHOUT CONTRAST CT CERVICAL SPINE WITHOUT CONTRAST TECHNIQUE: Multidetector CT imaging of the head, cervical spine, and maxillofacial structures were performed using the standard protocol without intravenous contrast. Multiplanar CT image reconstructions of the cervical spine and maxillofacial structures were also generated. COMPARISON:  05/14/2015 FINDINGS: CT HEAD FINDINGS Brain: No evidence of acute infarction, hemorrhage, hydrocephalus, extra-axial collection or mass lesion/mass effect. Vascular: No hyperdense vessel or unexpected calcification. Skull: Normal. Negative for fracture or focal lesion. Other: There is a frontal scalp hematoma just to the right of midline. CT MAXILLOFACIAL FINDINGS Osseous: There is a fracture, there is comminuted fracture of the right orbital floor. Along mid orbit, there is mild depression of the fracture, of 4 mm. There is no entrapment of the extra-ocular muscles. The fracture extends to the anterior infraorbital rim. There is a subtle fracture along the lateral right maxillary sinus wall. There is air along the lateral left maxillary sinus wall. There is no definite fracture, but 1 is  suspected given the presence of air as well as a small amount of non dependent hemorrhage in the sinus. There is no left orbital floor fracture. A subtle fracture of the lateral left orbital wall is suggested, nondisplaced. No other evidence of fractures.  No bone lesions. Orbits: No injury to either globe. No orbital hematoma or postseptal orbital inflammation. Sinuses: Dependent hemorrhage is noted in the maxillary sinuses, right greater than left, as well as the right sphenoid sinus. Remaining sinuses are clear. The visualized mastoid air cells and middle ear cavities are clear. Soft tissues: Small amounts of soft tissue air is seen adjacent to the right maxillary sinus and along the right cheek and adjacent to the left lateral orbit and lateral maxillary sinus. No soft tissue masses or adenopathy. CT CERVICAL SPINE FINDINGS Alignment: Normal. Skull base and vertebrae: There is a fracture, nondisplaced, of the anterior inferior corner of the C2 vertebra without associated soft tissue swelling. There is a questionable acute fracture of the distal aspect of the C3 spinous process, most suggested on the sagittal view, also with no soft tissue swelling. No other evidence of a fracture. No osteoblastic or osteolytic lesions. Soft tissues and spinal canal: No spinal canal mass, herniated disc or hematoma. There is heterogeneous enlargement of the left lobe of the thyroid gland consistent with goiter. Disc levels: Moderate loss of disc height with endplate spurring at L8-X2. Facet degenerative changes noted bilaterally. No significant central stenosis or neural foraminal narrowing. Upper chest: Apical pleuroparenchymal scarring. No acute findings in the lung apices. Other: None. IMPRESSION: HEAD CT 1. No acute intracranial abnormalities. 2. No skull fracture. 3. Frontal scalp hematoma. MAXILLOFACIAL CT 1. Mildly depressed comminuted fracture of the right orbital floor. 2. Subtle fracture of the right lateral maxillary  sinus wall. Probable nondisplaced fracture of the left lateral maxillary sinus wall. Possible subtle fracture of the lateral left orbital wall. 3. Dependent hemorrhage in the maxillary sinuses and right sphenoid sinus. CERVICAL CT 1. Small nondisplaced fracture along the anterior inferior corner of the C2 vertebra. This appears acute but there is no associated soft tissue swelling or hemorrhage. Fractures new since the prior CT. 2. Apparent fracture along the distal margin of the C3 spinous process, nondisplaced. 3. No other fractures or acute findings. Electronically Signed   By: Lajean Manes M.D.   On: 07/11/2016 21:56   - pertinent xrays, CT, MRI studies were reviewed and independently interpreted  Positive ROS: All other systems have  been reviewed and were otherwise negative with the exception of those mentioned in the HPI and as above.  Physical Exam: General: Alert, no acute distress Cardiovascular: No pedal edema Respiratory: No cyanosis, no use of accessory musculature GI: No organomegaly, abdomen is soft and non-tender Skin: No lesions in the area of chief complaint Neurologic: Sensation intact distally Psychiatric: Patient is competent for consent with normal mood and affect Lymphatic: No axillary or cervical lymphadenopathy  MUSCULOSKELETAL:  Bilateral knee exam shows moderate swelling and bruising around the knee. She has no open skin lesions. She is neurovascularly intact distally.  Assessment: 1. Right patella superior pole avulsion fracture 2. Left nondisplaced patella fracture  Plan: I would like to get a CT scan of the right knee to better characterize the fracture. This may be just an avulsion fracture that can be treated without any bracing or restrictions.  For the left patella fracture I have ordered a Bledsoe brace from [employer]. She may range her knee from 0-30 within the brace. I will follow up on the CT scan and have a formal recommendation for the right knee  afterwards.  Thank you for the consult and the opportunity to see Ms. Ernst Bowler Eduard Roux, MD [place] 10:21 AM

## 2016-07-12 NOTE — Consult Note (Signed)
Agree with plan laid out by Dr. Loleta Books yesterday. Will f/u with echocardiogram and telemetry monitoring.  Amlodipine if BP's remain elevated.  Will reassess next am.  Velvet Bathe

## 2016-07-12 NOTE — Consult Note (Signed)
Reason for Consult: facial fractures Referring Physician: Dr. Tanna Furry  Teresa Francis is an 76 y.o. female.  HPI: The patient s a 76 yrs old wf here for treatment after a fall at home.  She lives with her daughter who found her after the fall.  It was reported that she fell down 2 stairs at home and landed on her knees, hitting her face against the ground.  She has bruising, ecchymosis and swelling around her right eye.  The CT was reviewed and I agree with the reading.  She has dentures for upper and lower but not in her mouth at present.  She usually only wears the uppers.  Her daughter and son-in-law are at the bedside.  Forehead laceration was sutured.  Past Medical History:  Diagnosis Date  . Alzheimer disease   . Depression   . GERD (gastroesophageal reflux disease)   . Hyperlipidemia   . Hypertension     Past Surgical History:  Procedure Laterality Date  . CHOLECYSTECTOMY  2016  . TUBAL LIGATION  1978    Family History  Problem Relation Age of Onset  . Heart disease Mother   . Hypertension Mother   . Arthritis Father   . Cancer Father     Prostate  . Cancer Son     Lung  . Cancer Sister     Breast  . Arthritis Sister   . Cancer Brother     Throat  . Hypertension Daughter   . Diabetes Paternal Aunt   . Diabetes Paternal Uncle   . Diabetes Paternal Grandfather   . Cancer Sister     Breast  . Arthritis Sister   . Cancer Sister     Breast  . Arthritis Sister   . Cancer Brother     Prostate  . Diabetes Daughter     Uterine  . Stroke Neg Hx     Social History:  reports that she has never smoked. She has never used smokeless tobacco. She reports that she does not drink alcohol or use drugs.  Allergies:  Allergies  Allergen Reactions  . Lisinopril Cough    Medications: I have reviewed the patient's current medications.  Results for orders placed or performed during the hospital encounter of 07/11/16 (from the past 48 hour(s))  CBC with  Differential/Platelet     Status: Abnormal   Collection Time: 07/11/16  9:27 PM  Result Value Ref Range   WBC 15.1 (H) 4.0 - 10.5 K/uL   RBC 4.90 3.87 - 5.11 MIL/uL   Hemoglobin 13.3 12.0 - 15.0 g/dL   HCT 40.8 36.0 - 46.0 %   MCV 83.3 78.0 - 100.0 fL   MCH 27.1 26.0 - 34.0 pg   MCHC 32.6 30.0 - 36.0 g/dL   RDW 15.3 11.5 - 15.5 %   Platelets 324 150 - 400 K/uL   Neutrophils Relative % 80 %   Neutro Abs 12.0 (H) 1.7 - 7.7 K/uL   Lymphocytes Relative 16 %   Lymphs Abs 2.5 0.7 - 4.0 K/uL   Monocytes Relative 4 %   Monocytes Absolute 0.6 0.1 - 1.0 K/uL   Eosinophils Relative 0 %   Eosinophils Absolute 0.0 0.0 - 0.7 K/uL   Basophils Relative 0 %   Basophils Absolute 0.0 0.0 - 0.1 K/uL  Basic metabolic panel     Status: Abnormal   Collection Time: 07/11/16  9:27 PM  Result Value Ref Range   Sodium 138 135 - 145 mmol/L  Potassium 3.8 3.5 - 5.1 mmol/L   Chloride 101 101 - 111 mmol/L   CO2 26 22 - 32 mmol/L   Glucose, Bld 135 (H) 65 - 99 mg/dL   BUN 17 6 - 20 mg/dL   Creatinine, Ser 1.03 (H) 0.44 - 1.00 mg/dL   Calcium 9.2 8.9 - 10.3 mg/dL   GFR calc non Af Amer 51 (L) >60 mL/min   GFR calc Af Amer 59 (L) >60 mL/min    Comment: (NOTE) The eGFR has been calculated using the CKD EPI equation. This calculation has not been validated in all clinical situations. eGFR's persistently <60 mL/min signify possible Chronic Kidney Disease.    Anion gap 11 5 - 15  I-stat troponin, ED     Status: None   Collection Time: 07/11/16  9:41 PM  Result Value Ref Range   Troponin i, poc 0.00 0.00 - 0.08 ng/mL   Comment 3            Comment: Due to the release kinetics of cTnI, a negative result within the first hours of the onset of symptoms does not rule out myocardial infarction with certainty. If myocardial infarction is still suspected, repeat the test at appropriate intervals.   CBC     Status: Abnormal   Collection Time: 07/12/16  5:59 AM  Result Value Ref Range   WBC 11.7 (H) 4.0 -  10.5 K/uL   RBC 4.43 3.87 - 5.11 MIL/uL   Hemoglobin 11.9 (L) 12.0 - 15.0 g/dL   HCT 36.5 36.0 - 46.0 %   MCV 82.4 78.0 - 100.0 fL   MCH 26.9 26.0 - 34.0 pg   MCHC 32.6 30.0 - 36.0 g/dL   RDW 15.3 11.5 - 15.5 %   Platelets 299 150 - 400 K/uL  Basic metabolic panel     Status: Abnormal   Collection Time: 07/12/16  5:59 AM  Result Value Ref Range   Sodium 140 135 - 145 mmol/L   Potassium 3.9 3.5 - 5.1 mmol/L   Chloride 106 101 - 111 mmol/L   CO2 25 22 - 32 mmol/L   Glucose, Bld 168 (H) 65 - 99 mg/dL   BUN 17 6 - 20 mg/dL   Creatinine, Ser 0.93 0.44 - 1.00 mg/dL   Calcium 9.0 8.9 - 10.3 mg/dL   GFR calc non Af Amer 58 (L) >60 mL/min   GFR calc Af Amer >60 >60 mL/min    Comment: (NOTE) The eGFR has been calculated using the CKD EPI equation. This calculation has not been validated in all clinical situations. eGFR's persistently <60 mL/min signify possible Chronic Kidney Disease.    Anion gap 9 5 - 15    Dg Chest 1 View  Result Date: 07/11/2016 CLINICAL DATA:  Pain after fall EXAM: CHEST 1 VIEW COMPARISON:  None. FINDINGS: The heart size and mediastinal contours are within normal limits. Minimal biapical pleuroparenchymal scarring. Subtle irregularity of the posterior right sixth rib is suggested which may represent a nondisplaced fracture. This area is difficult to assess due to adjacent atelectatic lung. No pneumothorax is seen. No effusion. IMPRESSION: Subtle lucency is suggested of the posterior right sixth rib with adjacent atelectasis the may represent a nondisplaced fracture. Otherwise negative exam. Electronically Signed   By: Ashley Royalty M.D.   On: 07/11/2016 21:29   Ct Head Wo Contrast  Result Date: 07/11/2016 CLINICAL DATA:  Pt states she she "blacked out" and fell down 3-4 steps tonight. Pt currently c/o head and neck  pain. Laceration to left forehead noted. EXAM: CT HEAD WITHOUT CONTRAST CT MAXILLOFACIAL WITHOUT CONTRAST CT CERVICAL SPINE WITHOUT CONTRAST TECHNIQUE:  Multidetector CT imaging of the head, cervical spine, and maxillofacial structures were performed using the standard protocol without intravenous contrast. Multiplanar CT image reconstructions of the cervical spine and maxillofacial structures were also generated. COMPARISON:  05/14/2015 FINDINGS: CT HEAD FINDINGS Brain: No evidence of acute infarction, hemorrhage, hydrocephalus, extra-axial collection or mass lesion/mass effect. Vascular: No hyperdense vessel or unexpected calcification. Skull: Normal. Negative for fracture or focal lesion. Other: There is a frontal scalp hematoma just to the right of midline. CT MAXILLOFACIAL FINDINGS Osseous: There is a fracture, there is comminuted fracture of the right orbital floor. Along mid orbit, there is mild depression of the fracture, of 4 mm. There is no entrapment of the extra-ocular muscles. The fracture extends to the anterior infraorbital rim. There is a subtle fracture along the lateral right maxillary sinus wall. There is air along the lateral left maxillary sinus wall. There is no definite fracture, but 1 is suspected given the presence of air as well as a small amount of non dependent hemorrhage in the sinus. There is no left orbital floor fracture. A subtle fracture of the lateral left orbital wall is suggested, nondisplaced. No other evidence of fractures.  No bone lesions. Orbits: No injury to either globe. No orbital hematoma or postseptal orbital inflammation. Sinuses: Dependent hemorrhage is noted in the maxillary sinuses, right greater than left, as well as the right sphenoid sinus. Remaining sinuses are clear. The visualized mastoid air cells and middle ear cavities are clear. Soft tissues: Small amounts of soft tissue air is seen adjacent to the right maxillary sinus and along the right cheek and adjacent to the left lateral orbit and lateral maxillary sinus. No soft tissue masses or adenopathy. CT CERVICAL SPINE FINDINGS Alignment: Normal. Skull base  and vertebrae: There is a fracture, nondisplaced, of the anterior inferior corner of the C2 vertebra without associated soft tissue swelling. There is a questionable acute fracture of the distal aspect of the C3 spinous process, most suggested on the sagittal view, also with no soft tissue swelling. No other evidence of a fracture. No osteoblastic or osteolytic lesions. Soft tissues and spinal canal: No spinal canal mass, herniated disc or hematoma. There is heterogeneous enlargement of the left lobe of the thyroid gland consistent with goiter. Disc levels: Moderate loss of disc height with endplate spurring at B3-A1. Facet degenerative changes noted bilaterally. No significant central stenosis or neural foraminal narrowing. Upper chest: Apical pleuroparenchymal scarring. No acute findings in the lung apices. Other: None. IMPRESSION: HEAD CT 1. No acute intracranial abnormalities. 2. No skull fracture. 3. Frontal scalp hematoma. MAXILLOFACIAL CT 1. Mildly depressed comminuted fracture of the right orbital floor. 2. Subtle fracture of the right lateral maxillary sinus wall. Probable nondisplaced fracture of the left lateral maxillary sinus wall. Possible subtle fracture of the lateral left orbital wall. 3. Dependent hemorrhage in the maxillary sinuses and right sphenoid sinus. CERVICAL CT 1. Small nondisplaced fracture along the anterior inferior corner of the C2 vertebra. This appears acute but there is no associated soft tissue swelling or hemorrhage. Fractures new since the prior CT. 2. Apparent fracture along the distal margin of the C3 spinous process, nondisplaced. 3. No other fractures or acute findings. Electronically Signed   By: Lajean Manes M.D.   On: 07/11/2016 21:56   Ct Cervical Spine Wo Contrast  Result Date: 07/11/2016 CLINICAL DATA:  Pt  states she she "blacked out" and fell down 3-4 steps tonight. Pt currently c/o head and neck pain. Laceration to left forehead noted. EXAM: CT HEAD WITHOUT  CONTRAST CT MAXILLOFACIAL WITHOUT CONTRAST CT CERVICAL SPINE WITHOUT CONTRAST TECHNIQUE: Multidetector CT imaging of the head, cervical spine, and maxillofacial structures were performed using the standard protocol without intravenous contrast. Multiplanar CT image reconstructions of the cervical spine and maxillofacial structures were also generated. COMPARISON:  05/14/2015 FINDINGS: CT HEAD FINDINGS Brain: No evidence of acute infarction, hemorrhage, hydrocephalus, extra-axial collection or mass lesion/mass effect. Vascular: No hyperdense vessel or unexpected calcification. Skull: Normal. Negative for fracture or focal lesion. Other: There is a frontal scalp hematoma just to the right of midline. CT MAXILLOFACIAL FINDINGS Osseous: There is a fracture, there is comminuted fracture of the right orbital floor. Along mid orbit, there is mild depression of the fracture, of 4 mm. There is no entrapment of the extra-ocular muscles. The fracture extends to the anterior infraorbital rim. There is a subtle fracture along the lateral right maxillary sinus wall. There is air along the lateral left maxillary sinus wall. There is no definite fracture, but 1 is suspected given the presence of air as well as a small amount of non dependent hemorrhage in the sinus. There is no left orbital floor fracture. A subtle fracture of the lateral left orbital wall is suggested, nondisplaced. No other evidence of fractures.  No bone lesions. Orbits: No injury to either globe. No orbital hematoma or postseptal orbital inflammation. Sinuses: Dependent hemorrhage is noted in the maxillary sinuses, right greater than left, as well as the right sphenoid sinus. Remaining sinuses are clear. The visualized mastoid air cells and middle ear cavities are clear. Soft tissues: Small amounts of soft tissue air is seen adjacent to the right maxillary sinus and along the right cheek and adjacent to the left lateral orbit and lateral maxillary sinus. No  soft tissue masses or adenopathy. CT CERVICAL SPINE FINDINGS Alignment: Normal. Skull base and vertebrae: There is a fracture, nondisplaced, of the anterior inferior corner of the C2 vertebra without associated soft tissue swelling. There is a questionable acute fracture of the distal aspect of the C3 spinous process, most suggested on the sagittal view, also with no soft tissue swelling. No other evidence of a fracture. No osteoblastic or osteolytic lesions. Soft tissues and spinal canal: No spinal canal mass, herniated disc or hematoma. There is heterogeneous enlargement of the left lobe of the thyroid gland consistent with goiter. Disc levels: Moderate loss of disc height with endplate spurring at Z6-X0. Facet degenerative changes noted bilaterally. No significant central stenosis or neural foraminal narrowing. Upper chest: Apical pleuroparenchymal scarring. No acute findings in the lung apices. Other: None. IMPRESSION: HEAD CT 1. No acute intracranial abnormalities. 2. No skull fracture. 3. Frontal scalp hematoma. MAXILLOFACIAL CT 1. Mildly depressed comminuted fracture of the right orbital floor. 2. Subtle fracture of the right lateral maxillary sinus wall. Probable nondisplaced fracture of the left lateral maxillary sinus wall. Possible subtle fracture of the lateral left orbital wall. 3. Dependent hemorrhage in the maxillary sinuses and right sphenoid sinus. CERVICAL CT 1. Small nondisplaced fracture along the anterior inferior corner of the C2 vertebra. This appears acute but there is no associated soft tissue swelling or hemorrhage. Fractures new since the prior CT. 2. Apparent fracture along the distal margin of the C3 spinous process, nondisplaced. 3. No other fractures or acute findings. Electronically Signed   By: Lajean Manes M.D.   On:  07/11/2016 21:56   Ct Knee Right Wo Contrast  Result Date: 07/12/2016 CLINICAL DATA:  Right knee pain EXAM: CT OF THE RIGHT KNEE WITHOUT CONTRAST TECHNIQUE:  Multidetector CT imaging of the RIGHT knee was performed according to the standard protocol. Multiplanar CT image reconstructions were also generated. COMPARISON:  None. FINDINGS: Bones/Joint/Cartilage Marginal osteophyte of the superior medial patella with an acute fracture through the base of the osteophyte without displacement. Moderate hemarthrosis. No other acute fracture or dislocation. Generalized osteopenia. Normal alignment. Moderate medial femorotibial compartment joint space narrowing with subchondral sclerosis and marginal osteophytosis. Mild lateral femorotibial compartment joint space narrowing with tiny marginal osteophytes. Mild lateral patellofemoral compartment joint space narrowing. No aggressive lytic or sclerotic osseous lesion. Benign chondroid lesion involving the fibular head without endosteal scalloping, bone destruction or periosteal reaction. 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 Ligaments Ligaments are suboptimally evaluated by CT. Intact ACL and PCL. Medial and lateral collateral ligaments are grossly intact. Muscles and Tendons Muscles are normal. No muscle atrophy. No intramuscular hematoma. Intact quadriceps tendon. Intact patellar tendon. Soft tissue No fluid collection or hematoma.  No soft tissue mass. IMPRESSION: 1. Marginal osteophyte of the superior medial patella with an acute fracture through the base of the osteophyte without displacement. 2. Moderate size hemarthrosis. 3. 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 Electronically Signed   By: Kathreen Devoid   On: 07/12/2016 11:51   Dg Chest Port 1 View  Result Date: 07/12/2016 CLINICAL DATA:  Pain following fall with multifocal trauma EXAM: PORTABLE CHEST 1 VIEW COMPARISON:  July 11, 2016 FINDINGS: No pneumothorax. No edema or consolidation. Heart size and pulmonary vascularity are normal. No adenopathy. There is a hiatal hernia evident. No convincing rib fracture  appreciable on this current radiographic evaluation. IMPRESSION: No pneumothorax. No edema or consolidation. Stable cardiac silhouette. There is an apparent hiatal hernia. Electronically Signed   By: Lowella Grip III M.D.   On: 07/12/2016 08:10   Dg Knee Complete 4 Views Left  Result Date: 07/11/2016 CLINICAL DATA:  Fall; Pain in both knees, which have slight abrasions; laceration to forehead EXAM: LEFT KNEE - COMPLETE 4+ VIEW COMPARISON:  None. FINDINGS: There is a nondisplaced fracture of the patella with both a sagittal and transverse component. No other fractures.  No bone lesions. The knee joint is normally aligned. There is a joint effusion with a fat fluid level in the suprapatellar joint capsule. Bones are demineralized. IMPRESSION: 1. Nondisplaced patellar fracture with associated joint effusion with a fat fluid level. 2. No other fractures.  No dislocation. Electronically Signed   By: Lajean Manes M.D.   On: 07/11/2016 21:34   Dg Knee Complete 4 Views Right  Result Date: 07/11/2016 CLINICAL DATA:  Fall; Pain in both knees, which have slight abrasions; laceration to forehead EXAM: RIGHT KNEE - COMPLETE 4+ VIEW COMPARISON:  05/14/2015 FINDINGS: There is a small nondisplaced fracture from the superior pole of the patella. No other fractures. The joint is normally aligned. There is a joint effusion with a fat fluid level in the suprapatellar joint capsule. Bones are demineralized. There is a partly imaged soft tissue mass in the subcutaneous soft tissues of the anterior thigh. This is circumscribed relatively lucent. IMPRESSION: 1. Small nondisplaced fracture from the superior pole of the patella with an associated joint effusion and fat fluid level. 2. Subcutaneous soft tissue mass along the anterior mid thigh, not included on the field of view from the prior study. Recommend followup  assessment on a nonemergent basis with ultrasound and/or CT with contrast. Electronically Signed   By: Lajean Manes M.D.   On: 07/11/2016 21:37   Ct Maxillofacial Wo Contrast  Result Date: 07/11/2016 CLINICAL DATA:  Pt states she she "blacked out" and fell down 3-4 steps tonight. Pt currently c/o head and neck pain. Laceration to left forehead noted. EXAM: CT HEAD WITHOUT CONTRAST CT MAXILLOFACIAL WITHOUT CONTRAST CT CERVICAL SPINE WITHOUT CONTRAST TECHNIQUE: Multidetector CT imaging of the head, cervical spine, and maxillofacial structures were performed using the standard protocol without intravenous contrast. Multiplanar CT image reconstructions of the cervical spine and maxillofacial structures were also generated. COMPARISON:  05/14/2015 FINDINGS: CT HEAD FINDINGS Brain: No evidence of acute infarction, hemorrhage, hydrocephalus, extra-axial collection or mass lesion/mass effect. Vascular: No hyperdense vessel or unexpected calcification. Skull: Normal. Negative for fracture or focal lesion. Other: There is a frontal scalp hematoma just to the right of midline. CT MAXILLOFACIAL FINDINGS Osseous: There is a fracture, there is comminuted fracture of the right orbital floor. Along mid orbit, there is mild depression of the fracture, of 4 mm. There is no entrapment of the extra-ocular muscles. The fracture extends to the anterior infraorbital rim. There is a subtle fracture along the lateral right maxillary sinus wall. There is air along the lateral left maxillary sinus wall. There is no definite fracture, but 1 is suspected given the presence of air as well as a small amount of non dependent hemorrhage in the sinus. There is no left orbital floor fracture. A subtle fracture of the lateral left orbital wall is suggested, nondisplaced. No other evidence of fractures.  No bone lesions. Orbits: No injury to either globe. No orbital hematoma or postseptal orbital inflammation. Sinuses: Dependent hemorrhage is noted in the maxillary sinuses, right greater than left, as well as the right sphenoid sinus. Remaining sinuses  are clear. The visualized mastoid air cells and middle ear cavities are clear. Soft tissues: Small amounts of soft tissue air is seen adjacent to the right maxillary sinus and along the right cheek and adjacent to the left lateral orbit and lateral maxillary sinus. No soft tissue masses or adenopathy. CT CERVICAL SPINE FINDINGS Alignment: Normal. Skull base and vertebrae: There is a fracture, nondisplaced, of the anterior inferior corner of the C2 vertebra without associated soft tissue swelling. There is a questionable acute fracture of the distal aspect of the C3 spinous process, most suggested on the sagittal view, also with no soft tissue swelling. No other evidence of a fracture. No osteoblastic or osteolytic lesions. Soft tissues and spinal canal: No spinal canal mass, herniated disc or hematoma. There is heterogeneous enlargement of the left lobe of the thyroid gland consistent with goiter. Disc levels: Moderate loss of disc height with endplate spurring at Z6-X0. Facet degenerative changes noted bilaterally. No significant central stenosis or neural foraminal narrowing. Upper chest: Apical pleuroparenchymal scarring. No acute findings in the lung apices. Other: None. IMPRESSION: HEAD CT 1. No acute intracranial abnormalities. 2. No skull fracture. 3. Frontal scalp hematoma. MAXILLOFACIAL CT 1. Mildly depressed comminuted fracture of the right orbital floor. 2. Subtle fracture of the right lateral maxillary sinus wall. Probable nondisplaced fracture of the left lateral maxillary sinus wall. Possible subtle fracture of the lateral left orbital wall. 3. Dependent hemorrhage in the maxillary sinuses and right sphenoid sinus. CERVICAL CT 1. Small nondisplaced fracture along the anterior inferior corner of the C2 vertebra. This appears acute but there is no associated soft tissue swelling or  hemorrhage. Fractures new since the prior CT. 2. Apparent fracture along the distal margin of the C3 spinous process,  nondisplaced. 3. No other fractures or acute findings. Electronically Signed   By: Lajean Manes M.D.   On: 07/11/2016 21:56    Review of Systems  Constitutional: Negative.   HENT: Negative.   Eyes: Negative.   Respiratory: Negative.   Cardiovascular: Negative.   Gastrointestinal: Negative.   Genitourinary: Negative.   Musculoskeletal: Negative.   Skin: Negative.   Neurological: Negative.   Psychiatric/Behavioral: Negative.    Blood pressure (!) 135/58, pulse 68, temperature 99.3 F (37.4 C), temperature source Oral, resp. rate 16, height 5' 9.5" (1.765 m), weight 85.3 kg (188 lb 0.8 oz), SpO2 97 %. Physical Exam  Constitutional: She appears well-developed and well-nourished.  HENT:  Head: Normocephalic.  Eyes: EOM are normal. Pupils are equal, round, and reactive to light.  Cardiovascular: Normal rate.   Respiratory: Effort normal. No respiratory distress.  GI: Soft. She exhibits no distension.  Neurological: She is alert.  Skin: Skin is warm.  Psychiatric: She has a normal mood and affect. Her behavior is normal.    Assessment/Plan: Liquid diet recommended and strict oral hygiene.  Will re-evaluate in 1-2 weeks in the office.  As long as she is improving we will be able to avoid surgery.  Wallace Going 07/12/2016, 11:56 AM

## 2016-07-12 NOTE — Progress Notes (Signed)
Right patella fracture is more of a fractured enthesophyte from superior pole.  No restrictions with the right knee.  ROM and WBAT to right leg.  Azucena Cecil, MD [place] [phone] 11:40 AM

## 2016-07-12 NOTE — Progress Notes (Signed)
Orthopedic Tech Progress Note Patient Details:  Teresa Francis [birth year] [id number]  Patient ID: Teresa Francis, female   DOB: 1940-07-09, 76 y.o.   MRN: [id number]   Hildred Priest 07/12/2016, 10:33 AM Called in bio-tech brace order; spoke with Mortimer Fries

## 2016-07-13 ENCOUNTER — Other Ambulatory Visit (HOSPITAL_COMMUNITY): Payer: Commercial Managed Care - HMO

## 2016-07-13 DIAGNOSIS — M542 Cervicalgia: Secondary | ICD-10-CM

## 2016-07-13 LAB — BASIC METABOLIC PANEL
ANION GAP: 6 (ref 5–15)
BUN: 12 mg/dL (ref 6–20)
CALCIUM: 8.7 mg/dL — AB (ref 8.9–10.3)
CO2: 28 mmol/L (ref 22–32)
Chloride: 106 mmol/L (ref 101–111)
Creatinine, Ser: 0.9 mg/dL (ref 0.44–1.00)
Glucose, Bld: 118 mg/dL — ABNORMAL HIGH (ref 65–99)
Potassium: 3.9 mmol/L (ref 3.5–5.1)
Sodium: 140 mmol/L (ref 135–145)

## 2016-07-13 LAB — CBC
HCT: 36 % (ref 36.0–46.0)
Hemoglobin: 11.5 g/dL — ABNORMAL LOW (ref 12.0–15.0)
MCH: 26.8 pg (ref 26.0–34.0)
MCHC: 31.9 g/dL (ref 30.0–36.0)
MCV: 83.9 fL (ref 78.0–100.0)
PLATELETS: 280 10*3/uL (ref 150–400)
RBC: 4.29 MIL/uL (ref 3.87–5.11)
RDW: 16 % — AB (ref 11.5–15.5)
WBC: 10 10*3/uL (ref 4.0–10.5)

## 2016-07-13 NOTE — Progress Notes (Signed)
PROGRESS NOTE    Teresa Francis  WJX:[id number] DOB: [birth year] DOA: 07/11/2016 PCP: Webb Silversmith, NP   Brief Narrative:  Associated symptoms include neck pain and weakness. Pertinent negatives include no abdominal pain, chest pain, coughing or rash.   Called to see patient by ED for fall.  She fell down two stairs and struck her face.  She said she blacked out prior to falling.  No HOTN.  Complains of bilateral knee pain and neck pain.  Has laceration of right forehead and facial pain   Assessment & Plan:   Principal Problem:   Fall (on) (from) other stairs and steps, initial encounter - Per primary team.  Active Problems:   Essential hypertension - blood pressures relatively well controlled. Would recommend low sodium diet. Considering B blocker if persistently elevated > 140/90    Alzheimer's dementia   Syncope - work up incomplete of note patient has had some reported SVT. Non sustained.  - Will await Echocardiogram and consult cardiology next am.     Nondisplaced transverse fracture of left patella, initial encounter for closed fracture   Closed nondisplaced fracture of right patella, initial encounter   Code Status: Full Family Communication: d/c patient directly Disposition Plan: Per primary. From my standpoint will obtain cardiology consult for reported SVT and am awaiting echocardiogram results    Antimicrobials: None   Subjective: The patient has no new complaints. No acute issues overnight  Objective: Vitals:   07/12/16 1407 07/12/16 2200 07/13/16 0516 07/13/16 1645  BP: (!) 144/60 (!) 170/69 (!) 142/67 (!) 137/56  Pulse: 65 79 70 63  Resp: 16 16 16 16   Temp: 99.8 F (37.7 C) 99 F (37.2 C) 99.2 F (37.3 C) 98.4 F (36.9 C)  TempSrc: Oral Oral Oral Oral  SpO2: 96% 93% 95% 96%  Weight:      Height:        Intake/Output Summary (Last 24 hours) at 07/13/16 1739 Last data filed at 07/13/16 1600  Gross per 24 hour  Intake          1455.25 ml    Output             1000 ml  Net           455.25 ml   Filed Weights   07/11/16 1949 07/12/16 0035  Weight: 81.6 kg (180 lb) 85.3 kg (188 lb 0.8 oz)    Examination:  General exam: Appears calm and comfortable  Respiratory system: Clear to auscultation. Respiratory effort normal. Cardiovascular system: S1 & S2 heard, RRR. No JVD, murmurs, rubs, gallops or clicks. No pedal edema. Gastrointestinal system: Abdomen is nondistended, soft and nontender. No organomegaly or masses felt. Normal bowel sounds heard. Central nervous system: Alert and oriented. No focal neurological deficits. Extremities: Warm and pink Skin: Patient has multiple bruising in face Psychiatry:  Mood & affect appropriate.     Data Reviewed: I have personally reviewed following labs and imaging studies  CBC:  Recent Labs Lab 07/11/16 2127 07/12/16 0559 07/13/16 0316  WBC 15.1* 11.7* 10.0  NEUTROABS 12.0*  --   --   HGB 13.3 11.9* 11.5*  HCT 40.8 36.5 36.0  MCV 83.3 82.4 83.9  PLT 324 299 213   Basic Metabolic Panel:  Recent Labs Lab 07/11/16 2127 07/12/16 0559 07/13/16 0316  NA 138 140 140  K 3.8 3.9 3.9  CL 101 106 106  CO2 26 25 28   GLUCOSE 135* 168* 118*  BUN 17 17 12   CREATININE  1.03* 0.93 0.90  CALCIUM 9.2 9.0 8.7*   GFR: Estimated Creatinine Clearance: 61.6 mL/min (by C-G formula based on SCr of 0.9 mg/dL). Liver Function Tests: No results for input(s): AST, ALT, ALKPHOS, BILITOT, PROT, ALBUMIN in the last 168 hours. No results for input(s): LIPASE, AMYLASE in the last 168 hours. No results for input(s): AMMONIA in the last 168 hours. Coagulation Profile: No results for input(s): INR, PROTIME in the last 168 hours. Cardiac Enzymes: No results for input(s): CKTOTAL, CKMB, CKMBINDEX, TROPONINI in the last 168 hours. BNP (last 3 results) No results for input(s): PROBNP in the last 8760 hours. HbA1C: No results for input(s): HGBA1C in the last 72 hours. CBG: No results for  input(s): GLUCAP in the last 168 hours. Lipid Profile: No results for input(s): CHOL, HDL, LDLCALC, TRIG, CHOLHDL, LDLDIRECT in the last 72 hours. Thyroid Function Tests: No results for input(s): TSH, T4TOTAL, FREET4, T3FREE, THYROIDAB in the last 72 hours. Anemia Panel: No results for input(s): VITAMINB12, FOLATE, FERRITIN, TIBC, IRON, RETICCTPCT in the last 72 hours. Sepsis Labs: No results for input(s): PROCALCITON, LATICACIDVEN in the last 168 hours.  No results found for this or any previous visit (from the past 240 hour(s)).       Radiology Studies: Dg Chest 1 View  Result Date: 07/11/2016 CLINICAL DATA:  Pain after fall EXAM: CHEST 1 VIEW COMPARISON:  None. FINDINGS: The heart size and mediastinal contours are within normal limits. Minimal biapical pleuroparenchymal scarring. Subtle irregularity of the posterior right sixth rib is suggested which may represent a nondisplaced fracture. This area is difficult to assess due to adjacent atelectatic lung. No pneumothorax is seen. No effusion. IMPRESSION: Subtle lucency is suggested of the posterior right sixth rib with adjacent atelectasis the may represent a nondisplaced fracture. Otherwise negative exam. Electronically Signed   By: Ashley Royalty M.D.   On: 07/11/2016 21:29   Ct Head Wo Contrast  Result Date: 07/11/2016 CLINICAL DATA:  Pt states she she "blacked out" and fell down 3-4 steps tonight. Pt currently c/o head and neck pain. Laceration to left forehead noted. EXAM: CT HEAD WITHOUT CONTRAST CT MAXILLOFACIAL WITHOUT CONTRAST CT CERVICAL SPINE WITHOUT CONTRAST TECHNIQUE: Multidetector CT imaging of the head, cervical spine, and maxillofacial structures were performed using the standard protocol without intravenous contrast. Multiplanar CT image reconstructions of the cervical spine and maxillofacial structures were also generated. COMPARISON:  05/14/2015 FINDINGS: CT HEAD FINDINGS Brain: No evidence of acute infarction, hemorrhage,  hydrocephalus, extra-axial collection or mass lesion/mass effect. Vascular: No hyperdense vessel or unexpected calcification. Skull: Normal. Negative for fracture or focal lesion. Other: There is a frontal scalp hematoma just to the right of midline. CT MAXILLOFACIAL FINDINGS Osseous: There is a fracture, there is comminuted fracture of the right orbital floor. Along mid orbit, there is mild depression of the fracture, of 4 mm. There is no entrapment of the extra-ocular muscles. The fracture extends to the anterior infraorbital rim. There is a subtle fracture along the lateral right maxillary sinus wall. There is air along the lateral left maxillary sinus wall. There is no definite fracture, but 1 is suspected given the presence of air as well as a small amount of non dependent hemorrhage in the sinus. There is no left orbital floor fracture. A subtle fracture of the lateral left orbital wall is suggested, nondisplaced. No other evidence of fractures.  No bone lesions. Orbits: No injury to either globe. No orbital hematoma or postseptal orbital inflammation. Sinuses: Dependent hemorrhage is noted in  the maxillary sinuses, right greater than left, as well as the right sphenoid sinus. Remaining sinuses are clear. The visualized mastoid air cells and middle ear cavities are clear. Soft tissues: Small amounts of soft tissue air is seen adjacent to the right maxillary sinus and along the right cheek and adjacent to the left lateral orbit and lateral maxillary sinus. No soft tissue masses or adenopathy. CT CERVICAL SPINE FINDINGS Alignment: Normal. Skull base and vertebrae: There is a fracture, nondisplaced, of the anterior inferior corner of the C2 vertebra without associated soft tissue swelling. There is a questionable acute fracture of the distal aspect of the C3 spinous process, most suggested on the sagittal view, also with no soft tissue swelling. No other evidence of a fracture. No osteoblastic or osteolytic  lesions. Soft tissues and spinal canal: No spinal canal mass, herniated disc or hematoma. There is heterogeneous enlargement of the left lobe of the thyroid gland consistent with goiter. Disc levels: Moderate loss of disc height with endplate spurring at A8-T4. Facet degenerative changes noted bilaterally. No significant central stenosis or neural foraminal narrowing. Upper chest: Apical pleuroparenchymal scarring. No acute findings in the lung apices. Other: None. IMPRESSION: HEAD CT 1. No acute intracranial abnormalities. 2. No skull fracture. 3. Frontal scalp hematoma. MAXILLOFACIAL CT 1. Mildly depressed comminuted fracture of the right orbital floor. 2. Subtle fracture of the right lateral maxillary sinus wall. Probable nondisplaced fracture of the left lateral maxillary sinus wall. Possible subtle fracture of the lateral left orbital wall. 3. Dependent hemorrhage in the maxillary sinuses and right sphenoid sinus. CERVICAL CT 1. Small nondisplaced fracture along the anterior inferior corner of the C2 vertebra. This appears acute but there is no associated soft tissue swelling or hemorrhage. Fractures new since the prior CT. 2. Apparent fracture along the distal margin of the C3 spinous process, nondisplaced. 3. No other fractures or acute findings. Electronically Signed   By: Lajean Manes M.D.   On: 07/11/2016 21:56   Ct Cervical Spine Wo Contrast  Result Date: 07/11/2016 CLINICAL DATA:  Pt states she she "blacked out" and fell down 3-4 steps tonight. Pt currently c/o head and neck pain. Laceration to left forehead noted. EXAM: CT HEAD WITHOUT CONTRAST CT MAXILLOFACIAL WITHOUT CONTRAST CT CERVICAL SPINE WITHOUT CONTRAST TECHNIQUE: Multidetector CT imaging of the head, cervical spine, and maxillofacial structures were performed using the standard protocol without intravenous contrast. Multiplanar CT image reconstructions of the cervical spine and maxillofacial structures were also generated. COMPARISON:   05/14/2015 FINDINGS: CT HEAD FINDINGS Brain: No evidence of acute infarction, hemorrhage, hydrocephalus, extra-axial collection or mass lesion/mass effect. Vascular: No hyperdense vessel or unexpected calcification. Skull: Normal. Negative for fracture or focal lesion. Other: There is a frontal scalp hematoma just to the right of midline. CT MAXILLOFACIAL FINDINGS Osseous: There is a fracture, there is comminuted fracture of the right orbital floor. Along mid orbit, there is mild depression of the fracture, of 4 mm. There is no entrapment of the extra-ocular muscles. The fracture extends to the anterior infraorbital rim. There is a subtle fracture along the lateral right maxillary sinus wall. There is air along the lateral left maxillary sinus wall. There is no definite fracture, but 1 is suspected given the presence of air as well as a small amount of non dependent hemorrhage in the sinus. There is no left orbital floor fracture. A subtle fracture of the lateral left orbital wall is suggested, nondisplaced. No other evidence of fractures.  No bone lesions. Orbits: No  injury to either globe. No orbital hematoma or postseptal orbital inflammation. Sinuses: Dependent hemorrhage is noted in the maxillary sinuses, right greater than left, as well as the right sphenoid sinus. Remaining sinuses are clear. The visualized mastoid air cells and middle ear cavities are clear. Soft tissues: Small amounts of soft tissue air is seen adjacent to the right maxillary sinus and along the right cheek and adjacent to the left lateral orbit and lateral maxillary sinus. No soft tissue masses or adenopathy. CT CERVICAL SPINE FINDINGS Alignment: Normal. Skull base and vertebrae: There is a fracture, nondisplaced, of the anterior inferior corner of the C2 vertebra without associated soft tissue swelling. There is a questionable acute fracture of the distal aspect of the C3 spinous process, most suggested on the sagittal view, also with no  soft tissue swelling. No other evidence of a fracture. No osteoblastic or osteolytic lesions. Soft tissues and spinal canal: No spinal canal mass, herniated disc or hematoma. There is heterogeneous enlargement of the left lobe of the thyroid gland consistent with goiter. Disc levels: Moderate loss of disc height with endplate spurring at T5-V7. Facet degenerative changes noted bilaterally. No significant central stenosis or neural foraminal narrowing. Upper chest: Apical pleuroparenchymal scarring. No acute findings in the lung apices. Other: None. IMPRESSION: HEAD CT 1. No acute intracranial abnormalities. 2. No skull fracture. 3. Frontal scalp hematoma. MAXILLOFACIAL CT 1. Mildly depressed comminuted fracture of the right orbital floor. 2. Subtle fracture of the right lateral maxillary sinus wall. Probable nondisplaced fracture of the left lateral maxillary sinus wall. Possible subtle fracture of the lateral left orbital wall. 3. Dependent hemorrhage in the maxillary sinuses and right sphenoid sinus. CERVICAL CT 1. Small nondisplaced fracture along the anterior inferior corner of the C2 vertebra. This appears acute but there is no associated soft tissue swelling or hemorrhage. Fractures new since the prior CT. 2. Apparent fracture along the distal margin of the C3 spinous process, nondisplaced. 3. No other fractures or acute findings. Electronically Signed   By: Lajean Manes M.D.   On: 07/11/2016 21:56   Ct Knee Right Wo Contrast  Result Date: 07/12/2016 CLINICAL DATA:  Right knee pain EXAM: CT OF THE RIGHT KNEE WITHOUT CONTRAST TECHNIQUE: Multidetector CT imaging of the RIGHT knee was performed according to the standard protocol. Multiplanar CT image reconstructions were also generated. COMPARISON:  None. FINDINGS: Bones/Joint/Cartilage Marginal osteophyte of the superior medial patella with an acute fracture through the base of the osteophyte without displacement. Moderate hemarthrosis. No other acute  fracture or dislocation. Generalized osteopenia. Normal alignment. Moderate medial femorotibial compartment joint space narrowing with subchondral sclerosis and marginal osteophytosis. Mild lateral femorotibial compartment joint space narrowing with tiny marginal osteophytes. Mild lateral patellofemoral compartment joint space narrowing. No aggressive lytic or sclerotic osseous lesion. Benign chondroid lesion involving the fibular head without endosteal scalloping, bone destruction or periosteal reaction. 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 Ligaments Ligaments are suboptimally evaluated by CT. Intact ACL and PCL. Medial and lateral collateral ligaments are grossly intact. Muscles and Tendons Muscles are normal. No muscle atrophy. No intramuscular hematoma. Intact quadriceps tendon. Intact patellar tendon. Soft tissue No fluid collection or hematoma.  No soft tissue mass. IMPRESSION: 1. Marginal osteophyte of the superior medial patella with an acute fracture through the base of the osteophyte without displacement. 2. Moderate size hemarthrosis. 3. 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 Electronically Signed   By: Kathreen Devoid   On: 07/12/2016 11:51   Dg  Chest Port 1 View  Result Date: 07/12/2016 CLINICAL DATA:  Pain following fall with multifocal trauma EXAM: PORTABLE CHEST 1 VIEW COMPARISON:  July 11, 2016 FINDINGS: No pneumothorax. No edema or consolidation. Heart size and pulmonary vascularity are normal. No adenopathy. There is a hiatal hernia evident. No convincing rib fracture appreciable on this current radiographic evaluation. IMPRESSION: No pneumothorax. No edema or consolidation. Stable cardiac silhouette. There is an apparent hiatal hernia. Electronically Signed   By: Lowella Grip III M.D.   On: 07/12/2016 08:10   Dg Knee Complete 4 Views Left  Result Date: 07/11/2016 CLINICAL DATA:  Fall; Pain in both knees, which have slight  abrasions; laceration to forehead EXAM: LEFT KNEE - COMPLETE 4+ VIEW COMPARISON:  None. FINDINGS: There is a nondisplaced fracture of the patella with both a sagittal and transverse component. No other fractures.  No bone lesions. The knee joint is normally aligned. There is a joint effusion with a fat fluid level in the suprapatellar joint capsule. Bones are demineralized. IMPRESSION: 1. Nondisplaced patellar fracture with associated joint effusion with a fat fluid level. 2. No other fractures.  No dislocation. Electronically Signed   By: Lajean Manes M.D.   On: 07/11/2016 21:34   Dg Knee Complete 4 Views Right  Result Date: 07/11/2016 CLINICAL DATA:  Fall; Pain in both knees, which have slight abrasions; laceration to forehead EXAM: RIGHT KNEE - COMPLETE 4+ VIEW COMPARISON:  05/14/2015 FINDINGS: There is a small nondisplaced fracture from the superior pole of the patella. No other fractures. The joint is normally aligned. There is a joint effusion with a fat fluid level in the suprapatellar joint capsule. Bones are demineralized. There is a partly imaged soft tissue mass in the subcutaneous soft tissues of the anterior thigh. This is circumscribed relatively lucent. IMPRESSION: 1. Small nondisplaced fracture from the superior pole of the patella with an associated joint effusion and fat fluid level. 2. Subcutaneous soft tissue mass along the anterior mid thigh, not included on the field of view from the prior study. Recommend followup assessment on a nonemergent basis with ultrasound and/or CT with contrast. Electronically Signed   By: Lajean Manes M.D.   On: 07/11/2016 21:37   Ct Maxillofacial Wo Contrast  Result Date: 07/11/2016 CLINICAL DATA:  Pt states she she "blacked out" and fell down 3-4 steps tonight. Pt currently c/o head and neck pain. Laceration to left forehead noted. EXAM: CT HEAD WITHOUT CONTRAST CT MAXILLOFACIAL WITHOUT CONTRAST CT CERVICAL SPINE WITHOUT CONTRAST TECHNIQUE: Multidetector  CT imaging of the head, cervical spine, and maxillofacial structures were performed using the standard protocol without intravenous contrast. Multiplanar CT image reconstructions of the cervical spine and maxillofacial structures were also generated. COMPARISON:  05/14/2015 FINDINGS: CT HEAD FINDINGS Brain: No evidence of acute infarction, hemorrhage, hydrocephalus, extra-axial collection or mass lesion/mass effect. Vascular: No hyperdense vessel or unexpected calcification. Skull: Normal. Negative for fracture or focal lesion. Other: There is a frontal scalp hematoma just to the right of midline. CT MAXILLOFACIAL FINDINGS Osseous: There is a fracture, there is comminuted fracture of the right orbital floor. Along mid orbit, there is mild depression of the fracture, of 4 mm. There is no entrapment of the extra-ocular muscles. The fracture extends to the anterior infraorbital rim. There is a subtle fracture along the lateral right maxillary sinus wall. There is air along the lateral left maxillary sinus wall. There is no definite fracture, but 1 is suspected given the presence of air as well as a  small amount of non dependent hemorrhage in the sinus. There is no left orbital floor fracture. A subtle fracture of the lateral left orbital wall is suggested, nondisplaced. No other evidence of fractures.  No bone lesions. Orbits: No injury to either globe. No orbital hematoma or postseptal orbital inflammation. Sinuses: Dependent hemorrhage is noted in the maxillary sinuses, right greater than left, as well as the right sphenoid sinus. Remaining sinuses are clear. The visualized mastoid air cells and middle ear cavities are clear. Soft tissues: Small amounts of soft tissue air is seen adjacent to the right maxillary sinus and along the right cheek and adjacent to the left lateral orbit and lateral maxillary sinus. No soft tissue masses or adenopathy. CT CERVICAL SPINE FINDINGS Alignment: Normal. Skull base and vertebrae:  There is a fracture, nondisplaced, of the anterior inferior corner of the C2 vertebra without associated soft tissue swelling. There is a questionable acute fracture of the distal aspect of the C3 spinous process, most suggested on the sagittal view, also with no soft tissue swelling. No other evidence of a fracture. No osteoblastic or osteolytic lesions. Soft tissues and spinal canal: No spinal canal mass, herniated disc or hematoma. There is heterogeneous enlargement of the left lobe of the thyroid gland consistent with goiter. Disc levels: Moderate loss of disc height with endplate spurring at S3-M1. Facet degenerative changes noted bilaterally. No significant central stenosis or neural foraminal narrowing. Upper chest: Apical pleuroparenchymal scarring. No acute findings in the lung apices. Other: None. IMPRESSION: HEAD CT 1. No acute intracranial abnormalities. 2. No skull fracture. 3. Frontal scalp hematoma. MAXILLOFACIAL CT 1. Mildly depressed comminuted fracture of the right orbital floor. 2. Subtle fracture of the right lateral maxillary sinus wall. Probable nondisplaced fracture of the left lateral maxillary sinus wall. Possible subtle fracture of the lateral left orbital wall. 3. Dependent hemorrhage in the maxillary sinuses and right sphenoid sinus. CERVICAL CT 1. Small nondisplaced fracture along the anterior inferior corner of the C2 vertebra. This appears acute but there is no associated soft tissue swelling or hemorrhage. Fractures new since the prior CT. 2. Apparent fracture along the distal margin of the C3 spinous process, nondisplaced. 3. No other fractures or acute findings. Electronically Signed   By: Lajean Manes M.D.   On: 07/11/2016 21:56        Scheduled Meds: . cholecalciferol  1,000 Units Oral Daily  . docusate sodium  100 mg Oral BID  . donepezil  10 mg Oral QHS  . enoxaparin (LOVENOX) injection  40 mg Subcutaneous Q24H  . Melatonin  1 tablet Oral QHS,MR X 1  . memantine  5  mg Oral BID  . pantoprazole  40 mg Oral BID  . sertraline  50 mg Oral Daily  . simvastatin  40 mg Oral q1800  . vitamin B-12  1,000 mcg Oral Daily   Continuous Infusions: . dextrose 5 % and 0.9% NaCl 10 mL/hr at 07/12/16 0921     LOS: 2 days    Time spent: 20 minutes    Velvet Bathe, MD [facility] Pager [phone]  If 7PM-7AM, please contact night-coverage [email or web address] Password [facility] 07/13/2016, 5:39 PM

## 2016-07-13 NOTE — Progress Notes (Signed)
  Subjective: CC slept OK, having some neck pain  Objective: Vital signs in last 24 hours: Temp:  [99 F (37.2 C)-99.8 F (37.7 C)] 99.2 F (37.3 C) (04/30 0516) Pulse Rate:  [65-79] 70 (04/30 0516) Resp:  [16] 16 (04/30 0516) BP: (142-170)/(60-69) 142/67 (04/30 0516) SpO2:  [93 %-96 %] 95 % (04/30 0516) Last BM Date: 07/11/16  Intake/Output from previous day: 04/29 0701 - 04/30 0700 In: 480 [P.O.:480] Out: -  Intake/Output this shift: No intake/output data recorded.  General appearance: cooperative Neck: collar - adjusted Resp: clear to auscultation bilaterally Cardio: regular rate and rhythm GI: soft, NT, ND Extremities: R knee brace, L KI  Lab Results: CBC   Recent Labs  07/12/16 0559 07/13/16 0316  WBC 11.7* 10.0  HGB 11.9* 11.5*  HCT 36.5 36.0  PLT 299 280   BMET  Recent Labs  07/12/16 0559 07/13/16 0316  NA 140 140  K 3.9 3.9  CL 106 106  CO2 25 28  GLUCOSE 168* 118*  BUN 17 12  CREATININE 0.93 0.90  CALCIUM 9.0 8.7*   Assessment/Plan: Fall Question syncopal episode - syncopal workup per internal medicine; tele monitoring, ECHO pending, consider event monitor at discharge if nothing noted on telemetry during hospital stay C2 and C3 fractures - nonop per Dr. Cyndy Freeze, c-collar, f/u 4 weeks 6 th rib fracture - pulmonary toilet/IS Bilateral patellar fractures - per Dr. Erlinda Hong Right orbital wall fracture without impingement and facial laceration - full liquid diet per Dr. Marla Roe HTN - BP stable, labetalol PRN  Dementia - home meds HLD - simvastatin GERD - protonix FEN - full liquid, add colace BID VTE - lovenox, SCDs Plan - PT/OT, syncope W/U by medicine. She lives with her daughter and son in [place]. He son in law is on SSI and home during the day.   LOS: 2 days    Georganna Skeans, MD, MPH, FACS Trauma: [phone] General Surgery: [phone]  4/30/2018Patient ID: Teresa Francis, female   DOB: 13-Jun-1940, 76 y.o.   MRN: [id number]

## 2016-07-13 NOTE — Care Management Note (Signed)
Case Management Note  Patient Details  Name: CARLTON SWEANEY MRN: [id number] Date of Birth: 05/13/1940  Subjective/Objective:   Pt admitted on 07/11/16 s/p fall with bilateral patellar fx, 6th rib fx, Rt orbital wall fx , C2 and C3 fx.  PTA, pt independent, lives at home with daughter.                   Action/Plan: PT/OT evaluations pending.  Will follow for discharge planning as pt progresses.    Expected Discharge Date:                  Expected Discharge Plan:  [place]  In-House Referral:     Discharge planning Services  CM Consult  Post Acute Care Choice:    Choice offered to:     DME Arranged:    DME Agency:     HH Arranged:    [place] Agency:     Status of Service:  In process, will continue to follow  If discussed at Long Length of Stay Meetings, dates discussed:    Additional Comments:  Reinaldo Raddle, RN, BSN  Trauma/Neuro ICU Case Manager [phone]

## 2016-07-13 NOTE — Progress Notes (Signed)
RN received call from CCMD that pt's HR went into 140s for a few seconds and then went back to the 70s. Pt was asymptomatic and is resting in bed. Will continue to monitor.

## 2016-07-14 ENCOUNTER — Other Ambulatory Visit (HOSPITAL_COMMUNITY): Payer: Medicare HMO

## 2016-07-14 DIAGNOSIS — I471 Supraventricular tachycardia: Secondary | ICD-10-CM

## 2016-07-14 DIAGNOSIS — I951 Orthostatic hypotension: Secondary | ICD-10-CM

## 2016-07-14 NOTE — Progress Notes (Signed)
PROGRESS NOTE    Teresa Francis  LZJ:[id number] DOB: Oct 19, 1940 DOA: 07/11/2016 PCP: Webb Silversmith, NP   Brief Narrative:  Associated symptoms include neck pain and weakness. Pertinent negatives include no abdominal pain, chest pain, coughing or rash.   Called to see patient by ED for fall.  She fell down two stairs and struck her face.  She said she blacked out prior to falling.  No HOTN.  Complains of bilateral knee pain and neck pain.  Has laceration of right forehead and facial pain.  On further evaluation it was noted that patient has had several documented episodes of non sustained SVT. Cardiology subsequently consulted.   Assessment & Plan:   Principal Problem:   Fall (on) (from) other stairs and steps, initial encounter - Per primary team.  Active Problems:   Essential hypertension - blood pressures relatively well controlled. Would recommend low sodium diet. Considering B blocker if persistently elevated > 140/90    Alzheimer's dementia   Syncope - ? Is whether non sustained SVT is contributing to syncope or not. HR has fluctuated today from 63-70 - Echocardiogram pending.    Nondisplaced transverse fracture of left patella, initial encounter for closed fracture   Closed nondisplaced fracture of right patella, initial encounter   Code Status: Full Family Communication: d/c patient directly Disposition Plan: Awaiting cardiology recommendations and echocardiogram results    Antimicrobials: None   Subjective: The patient has no new complaints. No acute issues overnight  Objective: Vitals:   07/12/16 2200 07/13/16 0516 07/13/16 1645 07/13/16 1951  BP: (!) 170/69 (!) 142/67 (!) 137/56 140/64  Pulse: 79 70 63 67  Resp: 16 16 16 16   Temp: 99 F (37.2 C) 99.2 F (37.3 C) 98.4 F (36.9 C) 98.4 F (36.9 C)  TempSrc: Oral Oral Oral Oral  SpO2: 93% 95% 96% 96%  Weight:      Height:        Intake/Output Summary (Last 24 hours) at 07/14/16 0950 Last data  filed at 07/13/16 1700  Gross per 24 hour  Intake          1455.25 ml  Output             1000 ml  Net           455.25 ml   Filed Weights   07/11/16 1949 07/12/16 0035  Weight: 81.6 kg (180 lb) 85.3 kg (188 lb 0.8 oz)    Examination:  General exam: Appears calm and comfortable  Respiratory system: Clear to auscultation. Respiratory effort normal. Cardiovascular system: S1 & S2 heard, RRR. No JVD, murmurs Gastrointestinal system: Abdomen is nondistended, soft and nontender. No organomegaly or masses felt. Normal bowel sounds heard. Central nervous system: Alert and oriented. No focal neurological deficits. Extremities: Warm and pink Skin: Patient has multiple bruising in face Psychiatry:  Mood & affect appropriate.     Data Reviewed: I have personally reviewed following labs and imaging studies  CBC:  Recent Labs Lab 07/11/16 2127 07/12/16 0559 07/13/16 0316  WBC 15.1* 11.7* 10.0  NEUTROABS 12.0*  --   --   HGB 13.3 11.9* 11.5*  HCT 40.8 36.5 36.0  MCV 83.3 82.4 83.9  PLT 324 299 024   Basic Metabolic Panel:  Recent Labs Lab 07/11/16 2127 07/12/16 0559 07/13/16 0316  NA 138 140 140  K 3.8 3.9 3.9  CL 101 106 106  CO2 26 25 28   GLUCOSE 135* 168* 118*  BUN 17 17 12   CREATININE 1.03* 0.93  0.90  CALCIUM 9.2 9.0 8.7*   GFR: Estimated Creatinine Clearance: 61.6 mL/min (by C-G formula based on SCr of 0.9 mg/dL). Liver Function Tests: No results for input(s): AST, ALT, ALKPHOS, BILITOT, PROT, ALBUMIN in the last 168 hours. No results for input(s): LIPASE, AMYLASE in the last 168 hours. No results for input(s): AMMONIA in the last 168 hours. Coagulation Profile: No results for input(s): INR, PROTIME in the last 168 hours. Cardiac Enzymes: No results for input(s): CKTOTAL, CKMB, CKMBINDEX, TROPONINI in the last 168 hours. BNP (last 3 results) No results for input(s): PROBNP in the last 8760 hours. HbA1C: No results for input(s): HGBA1C in the last 72  hours. CBG: No results for input(s): GLUCAP in the last 168 hours. Lipid Profile: No results for input(s): CHOL, HDL, LDLCALC, TRIG, CHOLHDL, LDLDIRECT in the last 72 hours. Thyroid Function Tests: No results for input(s): TSH, T4TOTAL, FREET4, T3FREE, THYROIDAB in the last 72 hours. Anemia Panel: No results for input(s): VITAMINB12, FOLATE, FERRITIN, TIBC, IRON, RETICCTPCT in the last 72 hours. Sepsis Labs: No results for input(s): PROCALCITON, LATICACIDVEN in the last 168 hours.  No results found for this or any previous visit (from the past 240 hour(s)).       Radiology Studies: Ct Knee Right Wo Contrast  Result Date: 07/12/2016 CLINICAL DATA:  Right knee pain EXAM: CT OF THE RIGHT KNEE WITHOUT CONTRAST TECHNIQUE: Multidetector CT imaging of the RIGHT knee was performed according to the standard protocol. Multiplanar CT image reconstructions were also generated. COMPARISON:  None. FINDINGS: Bones/Joint/Cartilage Marginal osteophyte of the superior medial patella with an acute fracture through the base of the osteophyte without displacement. Moderate hemarthrosis. No other acute fracture or dislocation. Generalized osteopenia. Normal alignment. Moderate medial femorotibial compartment joint space narrowing with subchondral sclerosis and marginal osteophytosis. Mild lateral femorotibial compartment joint space narrowing with tiny marginal osteophytes. Mild lateral patellofemoral compartment joint space narrowing. No aggressive lytic or sclerotic osseous lesion. Benign chondroid lesion involving the fibular head without endosteal scalloping, bone destruction or periosteal reaction. 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 Ligaments Ligaments are suboptimally evaluated by CT. Intact ACL and PCL. Medial and lateral collateral ligaments are grossly intact. Muscles and Tendons Muscles are normal. No muscle atrophy. No intramuscular hematoma. Intact quadriceps tendon. Intact patellar tendon. Soft tissue  No fluid collection or hematoma.  No soft tissue mass. IMPRESSION: 1. Marginal osteophyte of the superior medial patella with an acute fracture through the base of the osteophyte without displacement. 2. Moderate size hemarthrosis. 3. 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 Electronically Signed   By: Kathreen Devoid   On: 07/12/2016 11:51        Scheduled Meds: . cholecalciferol  1,000 Units Oral Daily  . docusate sodium  100 mg Oral BID  . donepezil  10 mg Oral QHS  . enoxaparin (LOVENOX) injection  40 mg Subcutaneous Q24H  . Melatonin  1 tablet Oral QHS,MR X 1  . memantine  5 mg Oral BID  . pantoprazole  40 mg Oral BID  . sertraline  50 mg Oral Daily  . simvastatin  40 mg Oral q1800  . vitamin B-12  1,000 mcg Oral Daily   Continuous Infusions: . dextrose 5 % and 0.9% NaCl 10 mL/hr at 07/12/16 0921     LOS: 3 days   Time spent: 20 minutes  Velvet Bathe, MD [facility] Pager [phone]  If 7PM-7AM, please contact night-coverage [email or web address] Password [email or web address] 07/14/2016, 9:50 AM

## 2016-07-14 NOTE — Consult Note (Signed)
CARDIOLOGY CONSULT NOTE   Patient ID: Teresa Francis MRN: [id number] DOB/AGE: 78-12-1940 78 y.o.  Admit date: 07/11/2016  Primary Physician   Webb Silversmith, NP Primary Cardiologist   New to Dr. Oval Linsey Reason for Consultation   ? Syncope and SVT Requesting Physician  Dr. Wendee Beavers  HPI: Teresa Francis is a 76 y.o. female who is being seen today for the evaluation of SVT and ? Syncope  at the request of Dr. Wendee Beavers.   No prior cardiac history. She has a history of hypertension, hyperlipidemia and depression. Admitted 4/28 after a fall from stair. Workup revealed multiple fractures (C2, C3, 6 th rib fracture, bilateral patellar  and right orbital). She initially states that she might have passed out, however, after asking further questions she might have missed to step while going down one stair. History is unreliable given dementia. Patient states that she intermittently feels dizzy when tried to stand up. She lives with her daughter. She denies any chest pain, palpitation, orthopnea, PND, shortness of breath, melena or blood in his stool or urine.  EKG shows normal sinus rhythm. Electrolytes normal. Telemetry shows intermittent few small blush of SVT (asymptomatic). Pending echocardiogram.   Past Medical History:  Diagnosis Date  . Alzheimer disease   . Depression   . GERD (gastroesophageal reflux disease)   . Hyperlipidemia   . Hypertension      Past Surgical History:  Procedure Laterality Date  . CHOLECYSTECTOMY  2016  . TUBAL LIGATION  1978    Allergies  Allergen Reactions  . Lisinopril Cough    I have reviewed the patient's current medications . cholecalciferol  1,000 Units Oral Daily  . docusate sodium  100 mg Oral BID  . donepezil  10 mg Oral QHS  . enoxaparin (LOVENOX) injection  40 mg Subcutaneous Q24H  . Melatonin  1 tablet Oral QHS,MR X 1  . memantine  5 mg Oral BID  . pantoprazole  40 mg Oral BID  . sertraline  50 mg Oral Daily  . simvastatin  40 mg Oral  q1800  . vitamin B-12  1,000 mcg Oral Daily   . dextrose 5 % and 0.9% NaCl Stopped (07/14/16 1100)   acetaminophen, HYDROmorphone (DILAUDID) injection, labetalol, methocarbamol, ondansetron **OR** ondansetron (ZOFRAN) IV, oxyCODONE  Prior to Admission medications   Medication Sig Start Date End Date Taking? Authorizing Provider  aspirin 81 MG tablet Take 81 mg by mouth daily.   Yes Historical Provider, MD  cholecalciferol (VITAMIN D) 1000 units tablet Take 1,000 Units by mouth daily.   Yes Historical Provider, MD  donepezil (ARICEPT) 10 MG tablet TAKE 1 TABLET AT BEDTIME 11/08/15  Yes Jearld Fenton, NP  ibandronate (BONIVA) 150 MG tablet Take 1 tablet (150 mg total) by mouth every 30 (thirty) days. Take in the morning with a full glass of water, on an empty stomach, and do not take anything else by mouth or lie down for the next 30 min. 01/07/16  Yes Jearld Fenton, NP  Melatonin 5 MG TABS Take 1 tablet by mouth at bedtime and may repeat dose one time if needed.    Yes Historical Provider, MD  memantine (NAMENDA) 5 MG tablet TAKE 1 TABLET TWICE DAILY 04/07/16  Yes Jearld Fenton, NP  pantoprazole (PROTONIX) 40 MG tablet Take 1 tablet (40 mg total) by mouth 2 (two) times daily. 11/06/15  Yes Jearld Fenton, NP  sertraline (ZOLOFT) 50 MG tablet TAKE 1 TABLET EVERY DAY 04/07/16  Yes  Jearld Fenton, NP  simvastatin (ZOCOR) 40 MG tablet TAKE 1 TABLET DAILY AT 6 PM 02/12/16  Yes Jearld Fenton, NP  vitamin B-12 (CYANOCOBALAMIN) 1000 MCG tablet Take 1,000 mcg by mouth daily.   Yes Historical Provider, MD  vitamin E 400 UNIT capsule Take 400 Units by mouth daily.   Yes Historical Provider, MD     Social History   Social History  . Marital status: Widowed    Spouse name: N/A  . Number of children: N/A  . Years of education: N/A   Occupational History  . Not on file.   Social History Main Topics  . Smoking status: Never Smoker  . Smokeless tobacco: Never Used  . Alcohol use No  . Drug use: No   . Sexual activity: Not on file   Other Topics Concern  . Not on file   Social History Narrative  . No narrative on file    Family Status  Relation Status  . Mother Deceased  . Father Deceased  . Son Deceased  . Sister   . Brother   . Daughter   . Paternal Aunt   . Paternal Uncle   . Paternal Grandfather   . Sister   . Sister   . Brother   . Daughter   . Neg Hx    Family History  Problem Relation Age of Onset  . Heart disease Mother   . Hypertension Mother   . Arthritis Father   . Cancer Father     Prostate  . Cancer Son     Lung  . Cancer Sister     Breast  . Arthritis Sister   . Cancer Brother     Throat  . Hypertension Daughter   . Diabetes Paternal Aunt   . Diabetes Paternal Uncle   . Diabetes Paternal Grandfather   . Cancer Sister     Breast  . Arthritis Sister   . Cancer Sister     Breast  . Arthritis Sister   . Cancer Brother     Prostate  . Diabetes Daughter     Uterine  . Stroke Neg Hx       ROS:  Full 14 point review of systems complete and found to be negative unless listed above.  Physical Exam: Blood pressure 140/64, pulse 67, temperature 98.4 F (36.9 C), temperature source Oral, resp. rate 16, height 5' 9.5" (1.765 m), weight 188 lb 0.8 oz (85.3 kg), SpO2 96 %.  General: Thin elderly  female in no acute distress Head: Right eye and face has multiple bruises Lungs: Resp regular and unlabored, CTA. Heart: RRR no s3, s4, or murmurs..   Neck: No carotid bruits. No lymphadenopathy.  Aspen C collar in place Abdomen: Bowel sounds present, abdomen soft and non-tender without masses or hernias noted. Msk:  No spine or cva tenderness. No weakness, no joint deformities or effusions. Extremities: No clubbing, cyanosis or edema. DP/PT/Radials 2+ and equal bilaterally. Brace on R leg Neuro: Alert and oriented X 3. No focal deficits noted. Psych:  Mild dementia, responds appropriately Skin: multiple bruise.   Labs:   Lab Results  Component  Value Date   WBC 10.0 07/13/2016   HGB 11.5 (L) 07/13/2016   HCT 36.0 07/13/2016   MCV 83.9 07/13/2016   PLT 280 07/13/2016   No results for input(s): INR in the last 72 hours.  Recent Labs Lab 07/13/16 0316  NA 140  K 3.9  CL 106  CO2 28  BUN 12  CREATININE 0.90  CALCIUM 8.7*  GLUCOSE 118*   Recent Labs  07/11/16 2141  TROPIPOC 0.00    Echo: Pending    Radiology:  No results found.  ASSESSMENT AND PLAN:     1. Fall (on) (from) other stairs and steps, initial encounter - Seems mechanical fall. However, history unreliable given dementia. She has intermittent dizziness when tried to stand up. She will need orthostatic vitals. Pending echo.   2. SVT - Intermittent small burst of SVT. Asymptomatic. Electrolytes normal. Check Mg. Consider low dose BB (pending orthostatic vitals).  Pending echo.   MD to see.  SignedLeanor Kail, [place] 07/14/2016, 1:41 PM Pager [phone]  Co-Sign MD

## 2016-07-14 NOTE — Evaluation (Signed)
Physical Therapy Evaluation Patient Details Name: Teresa Francis MRN: [id number] DOB: 10/14/1940 Today's Date: 07/14/2016   History of Present Illness   Pt admitted on 07/11/16 s/p fall with bilateral patellar fx, 6th rib fx, Rt orbital wall fx , C2 and C3 fx.  Clinical Impression  Pt presents with dependencies in mobility secondary to recent fall and multiple fxs. I spoke wit Dr. Erlinda Hong to clarify LE WB and brace order. Per dr. Erlinda Hong pt is WBAT bil LE, bledsoe brace on L LE and no brace or KI needed on R LE. Pt mod assist with transfers and ambulated to the recliner with min assist using RW. Pt reports she will have assistance from her daughter and son-in-law after d/c home. Anticipate pt will make good progress with therapy and recommend D/C home and HHPT follow-up. Pt would benefit from continued acute PT to maximize mobility and independence for return home with family providing support.    Follow Up Recommendations Home health PT;Supervision for mobility/OOB    Equipment Recommendations  Rolling walker with 5" wheels    Recommendations for Other Services OT consult     Precautions / Restrictions Precautions Precautions: Fall;Cervical;Knee Required Braces or Orthoses: Cervical Brace Other Brace/Splint:  (Left LE) Restrictions Weight Bearing Restrictions: Yes RLE Weight Bearing: Weight bearing as tolerated      Mobility  Bed Mobility Overal bed mobility: Needs Assistance Bed Mobility: Supine to Sit     Supine to sit: Min assist     General bed mobility comments: use of rail and HOB elevated 75 degrees  Transfers Overall transfer level: Needs assistance Equipment used: Rolling walker (2 wheeled) Transfers: Sit to/from [facility] Sit to Stand: Mod assist Stand pivot transfers: Mod assist       General transfer comment: cues for hand placement for increased safety  Ambulation/Gait Ambulation/Gait assistance: Min assist Ambulation Distance (Feet): 6  Feet Assistive device: Rolling walker (2 wheeled) Gait Pattern/deviations: Step-to pattern;Decreased stride length   Gait velocity interpretation: Below normal speed for age/gender    Stairs            Wheelchair Mobility    Modified Rankin (Stroke Patients Only)       Balance Overall balance assessment: Needs assistance Sitting-balance support: No upper extremity supported Sitting balance-Leahy Scale: Good     Standing balance support: Bilateral upper extremity supported Standing balance-Leahy Scale: Fair                               Pertinent Vitals/Pain Pain Assessment: 0-10 Pain Score: 2  Pain Location: Bil Knees Pain Descriptors / Indicators: Discomfort Pain Intervention(s): Limited activity within patient's tolerance;Monitored during session;Repositioned    Home Living Family/patient expects to be discharged to:: Private residence Living Arrangements: Children Available Help at Discharge: Family Type of Home: House Home Access: Stairs to enter Entrance Stairs-Rails: Left Entrance Stairs-Number of Steps: 3 Home Layout: One level Home Equipment:  (Pt unsure)      Prior Function Level of Independence: Independent               Hand Dominance        Extremity/Trunk Assessment   Upper Extremity Assessment Upper Extremity Assessment: Defer to OT evaluation    Lower Extremity Assessment Lower Extremity Assessment: RLE deficits/detail;LLE deficits/detail RLE: Unable to fully assess due to pain LLE: Unable to fully assess due to immobilization;Unable to fully assess due to pain  Communication   Communication: No difficulties  Cognition Arousal/Alertness: Awake/alert Behavior During Therapy: WFL for tasks assessed/performed Overall Cognitive Status: Within Functional Limits for tasks assessed                                        General Comments General comments (skin integrity, edema, etc.): Spoke  with Dr. Erlinda Hong directly to clarify LE brace and restrictions. Pt is Bil WBAT, Bledsoe brace on L LE and R LE no brace needed.    Exercises     Assessment/Plan    PT Assessment Patient needs continued PT services  PT Problem List Decreased strength;Decreased range of motion;Decreased activity tolerance;Decreased balance;Decreased mobility;Decreased knowledge of use of DME;Decreased safety awareness;Pain       PT Treatment Interventions DME instruction;Gait training;Stair training;Therapeutic activities;Functional mobility training;Therapeutic exercise;Patient/family education    PT Goals (Current goals can be found in the Care Plan section)  Acute Rehab PT Goals Patient Stated Goal: To return home with my daughter PT Goal Formulation: With patient Time For Goal Achievement: 07/28/16 Potential to Achieve Goals: Good    Frequency Min 5X/week   Barriers to discharge        Co-evaluation               AM-PAC PT "6 Clicks" Daily Activity  Outcome Measure Difficulty turning over in bed (including adjusting bedclothes, sheets and blankets)?: A Little Difficulty moving from lying on back to sitting on the side of the bed? : A Little Difficulty sitting down on and standing up from a chair with arms (e.g., wheelchair, bedside commode, etc,.)?: A Little Help needed moving to and from a bed to chair (including a wheelchair)?: A Little Help needed walking in hospital room?: A Little   6 Click Score: 15    End of Session Equipment Utilized During Treatment: Gait belt;Cervical collar;Other (comment) (L Bledsoe brace) Activity Tolerance: Patient tolerated treatment well Patient left: in bed;with call bell/phone within reach Nurse Communication: Mobility status PT Visit Diagnosis: Difficulty in walking, not elsewhere classified (R26.2);Muscle weakness (generalized) (M62.81)    Time: 0926-1000 PT Time Calculation (min) (ACUTE ONLY): 34 min   Charges:   PT Evaluation $PT Eval  Moderate Complexity: 1 Procedure PT Treatments $Gait Training: 8-22 mins   PT G Codes:        Charlies Rayburn PT  Raphael Espe, [place] 07/14/2016, 10:09 AM

## 2016-07-14 NOTE — Evaluation (Signed)
Occupational Therapy Evaluation Patient Details Name: Teresa Francis MRN: [id number] DOB: [birth year] Today's Date: 07/14/2016    History of Present Illness  Pt admitted on 07/11/16 s/p fall with bilateral patellar fx, 6th rib fx, Rt orbital wall fx , C2 and C3 fx.   Clinical Impression   Pt admitted with above and presents to OT with deficits impacting her ability to complete ADLs at [facility].  Pt completed sit > stand with min assist progressing to min guard during session.  Ambulated to room toilet with RW with min guard.  Pt reports increased pain in Lt knee with bending, however tolerates movement well.  Completed grooming tasks in standing with min guard - supervision.  Pt reports son-in-law home during day and can provide assistance as needed.  Recommend continued OT acutely to increase independence and safety with ADLs to prepare to d/c home with family providing supervision/assist.    Follow Up Recommendations  Home health OT;Supervision/Assistance - 24 hour    Equipment Recommendations  3 in 1 bedside commode;Tub/shower bench    Recommendations for Other Services       Precautions / Restrictions Precautions Precautions: Fall;Cervical;Knee Required Braces or Orthoses: Cervical Brace Other Brace/Splint: Bledsoe brace on LLE      Mobility Bed Mobility               General bed mobility comments: received upright in chair  Transfers Overall transfer level: Needs assistance Equipment used: Rolling walker (2 wheeled) Transfers: Sit to/from [facility] Sit to Stand: Min assist Stand pivot transfers: Min assist       General transfer comment: Demonstrating carryover from education during PT session this AM regarding hand placement        ADL either performed or assessed with clinical judgement   ADL Overall ADL's : Needs assistance/impaired     Grooming: Supervision/safety;Standing       Lower Body Bathing: Minimal assistance;Sit to/from  stand Lower Body Bathing Details (indicate cue type and reason): Requires assist to wash lower legs and feet     Lower Body Dressing: Minimal assistance;Moderate assistance;Sit to/from stand   Toilet Transfer: Min guard;Ambulation;BSC;RW Toilet Transfer Details (indicate cue type and reason): BSC over toilet.  Min guard ambulation, close supervision during sit > stand from elevated surface.  Discussed recommendation for 3 in 1 to increase safety and independence with transfers [place] and Hygiene: Supervision/safety;Sitting/lateral lean;Sit to/from stand       Functional mobility during ADLs: Min guard                    Pertinent Vitals/Pain Pain Assessment: 0-10 Pain Score: 2  Pain Location: Bil Knees Pain Descriptors / Indicators: Discomfort Pain Intervention(s): Limited activity within patient's tolerance;Monitored during session;Repositioned        Extremity/Trunk Assessment Upper Extremity Assessment Upper Extremity Assessment: Overall WFL for tasks assessed           Communication Communication Communication: No difficulties   Cognition Arousal/Alertness: Awake/alert Behavior During Therapy: WFL for tasks assessed/performed Overall Cognitive Status: Within Functional Limits for tasks assessed                                                Home Living Family/patient expects to be discharged to:: Private residence Living Arrangements: Children Available Help at Discharge: Family;Available 24 hours/day Type of Home: House  Home Access: Stairs to enter Entrance Stairs-Number of Steps: 3 Entrance Stairs-Rails: Left Home Layout: One level     Bathroom Shower/Tub: Teacher, early years/pre: Standard     Home Equipment: Grab bars - tub/shower          Prior Functioning/Environment Level of Independence: Independent                 OT Problem List: Decreased range of motion;Decreased  activity tolerance;Impaired balance (sitting and/or standing);Decreased knowledge of use of DME or AE;Pain      OT Treatment/Interventions: Self-care/ADL training;DME and/or AE instruction;Therapeutic activities;Patient/family education    OT Goals(Current goals can be found in the care plan section) Acute Rehab OT Goals Patient Stated Goal: To return home with my daughter OT Goal Formulation: With patient Time For Goal Achievement: 07/28/16 Potential to Achieve Goals: Good  OT Frequency: Min 2X/week    AM-PAC PT "6 Clicks" Daily Activity     Outcome Measure Help from another person eating meals?: None Help from another person taking care of personal grooming?: A Little Help from another person toileting, which includes using toliet, bedpan, or urinal?: A Little Help from another person bathing (including washing, rinsing, drying)?: A Little Help from another person to put on and taking off regular upper body clothing?: A Little Help from another person to put on and taking off regular lower body clothing?: A Little 6 Click Score: 19   End of Session Equipment Utilized During Treatment: Gait belt;Rolling walker;Left knee immobilizer Nurse Communication: Mobility status (toileting)  Activity Tolerance: Patient tolerated treatment well Patient left: in chair;with call bell/phone within reach  OT Visit Diagnosis: Other abnormalities of gait and mobility (R26.89);Pain;Muscle weakness (generalized) (M62.81) Pain - Right/Left:  (Left and Right) Pain - part of body: Leg                Time: 6734-1937 OT Time Calculation (min): 14 min Charges:  OT General Charges $OT Visit: 1 Procedure OT Evaluation $OT Eval Moderate Complexity: 1 Procedure   Simonne Come, [phone] 07/14/2016, 2:34 PM

## 2016-07-14 NOTE — Progress Notes (Signed)
Patient ID: Teresa Francis, female   DOB: Jun 23, 1940, 76 y.o.   MRN: [id number]    Subjective: CC tolerating fulls, pain controlled  Objective: Vital signs in last 24 hours: Temp:  [98.4 F (36.9 C)] 98.4 F (36.9 C) (04/30 1951) Pulse Rate:  [63-67] 67 (04/30 1951) Resp:  [16] 16 (04/30 1951) BP: (137-140)/(56-64) 140/64 (04/30 1951) SpO2:  [96 %] 96 % (04/30 1951) Last BM Date: 07/11/16  Intake/Output from previous day: 04/30 0701 - 05/01 0700 In: 1455.3 [P.O.:960; I.V.:495.3] Out: 1000 [Urine:1000] Intake/Output this shift: No intake/output data recorded.  General appearance: alert and cooperative Neck: collar Resp: clear to auscultation bilaterally Cardio: regular rate and rhythm GI: soft, NT Extremities: R knee brace, L KI Neurologic: Mental status: Alert, oriented, thought content appropriate  Lab Results: CBC   Recent Labs  07/12/16 0559 07/13/16 0316  WBC 11.7* 10.0  HGB 11.9* 11.5*  HCT 36.5 36.0  PLT 299 280   BMET  Recent Labs  07/12/16 0559 07/13/16 0316  NA 140 140  K 3.9 3.9  CL 106 106  CO2 25 28  GLUCOSE 168* 118*  BUN 17 12  CREATININE 0.93 0.90  CALCIUM 9.0 8.7*   Assessment/Plan: Fall Question syncopal episode - syncopal workup per internal medicine/cardiology (short run of SVT); tele monitoring, ECHO  C2 and C3 fractures - nonop per Dr. Cyndy Freeze, c-collar, f/u 4 weeks 6 th rib fracture - pulmonary toilet/IS Bilateral patellar fractures - per Dr. Erlinda Hong Right orbital wall fracture without impingement and facial laceration - full liquid diet per Dr. Marla Roe HTN - BP stable, labetalol PRN  Dementia - home meds HLD - simvastatin GERD - protonix FEN - full liquid, colace BID VTE - lovenox, SCDs Plan - PT/OT, syncope W/U by medicine/cardiology    LOS: 3 days    Georganna Skeans, MD, MPH, FACS Trauma: [phone] General Surgery: [phone]  07/14/2016

## 2016-07-15 ENCOUNTER — Inpatient Hospital Stay (HOSPITAL_COMMUNITY): Payer: Medicare HMO

## 2016-07-15 DIAGNOSIS — W19XXXA Unspecified fall, initial encounter: Secondary | ICD-10-CM

## 2016-07-15 DIAGNOSIS — I951 Orthostatic hypotension: Secondary | ICD-10-CM

## 2016-07-15 DIAGNOSIS — R55 Syncope and collapse: Secondary | ICD-10-CM

## 2016-07-15 DIAGNOSIS — G309 Alzheimer's disease, unspecified: Secondary | ICD-10-CM

## 2016-07-15 DIAGNOSIS — I471 Supraventricular tachycardia: Secondary | ICD-10-CM

## 2016-07-15 DIAGNOSIS — F0280 Dementia in other diseases classified elsewhere without behavioral disturbance: Secondary | ICD-10-CM

## 2016-07-15 LAB — ECHOCARDIOGRAM COMPLETE
Height: 69.5 in
WEIGHTICAEL: 3008.84 [oz_av]

## 2016-07-15 LAB — TSH: TSH: 3.116 u[IU]/mL (ref 0.350–4.500)

## 2016-07-15 LAB — T4, FREE: FREE T4: 0.89 ng/dL (ref 0.61–1.12)

## 2016-07-15 LAB — MAGNESIUM: Magnesium: 2 mg/dL (ref 1.7–2.4)

## 2016-07-15 NOTE — Progress Notes (Signed)
Spoke with pt's daughter, Bevelyn Buckles, at her request, to discuss options for dc.  Pt lives at home with daughter and son in law.  Daughter works, but son is able to provide supervision while she works.  PT/OT recommending [place] with 24h supervision, [place] aide.  Daughter feels that pt needs some sort of rehab prior to returning home, and that she is not ready for her mother to dc.   Explained to daughter that pt's insurance is very unlikely to approve inpatient rehab or SNF stay, as recommendation is for [facility] followup, and therapists feel pt is safe to dc home.  Offered to provide list of private duty sitters/aides to supplement care as needed.   Daughter is agreeable to pt discharge tomorrow 5/3 with [facility] care; she will need HHPT/OT and [place] aide with face to face.  Will call daughter in AM to confirm pt/family choice for [facility] care.  Daughter states she appreciates my call.  Will follow up in AM.   Would recommend follow up therapy in AM.    Reinaldo Raddle, RN, BSN  Trauma/Neuro ICU Case Manager [phone]

## 2016-07-15 NOTE — Progress Notes (Signed)
Progress Note  Patient Name: Teresa Francis Date of Encounter: 07/15/2016  Primary Cardiologist: Dr. Oval Linsey  Subjective   No chest pain, dyspnea or palpitations. L knee pain.   Inpatient Medications    Scheduled Meds: . cholecalciferol  1,000 Units Oral Daily  . docusate sodium  100 mg Oral BID  . donepezil  10 mg Oral QHS  . enoxaparin (LOVENOX) injection  40 mg Subcutaneous Q24H  . Melatonin  1 tablet Oral QHS,MR X 1  . memantine  5 mg Oral BID  . pantoprazole  40 mg Oral BID  . sertraline  50 mg Oral Daily  . simvastatin  40 mg Oral q1800  . vitamin B-12  1,000 mcg Oral Daily   Continuous Infusions: . dextrose 5 % and 0.9% NaCl Stopped (07/14/16 1100)   PRN Meds: acetaminophen, HYDROmorphone (DILAUDID) injection, labetalol, methocarbamol, ondansetron **OR** ondansetron (ZOFRAN) IV, oxyCODONE   Vital Signs    Vitals:   07/13/16 1951 07/14/16 1534 07/14/16 2133 07/15/16 0458  BP: 140/64 (!) 177/63 (!) 175/74 (!) 151/59  Pulse: 67  77 66  Resp: 16 16 17 17   Temp: 98.4 F (36.9 C) 98.5 F (36.9 C) 97.5 F (36.4 C) 98.3 F (36.8 C)  TempSrc: Oral  Oral Oral  SpO2: 96% 98% 99% 94%  Weight:      Height:        Intake/Output Summary (Last 24 hours) at 07/15/16 0953 Last data filed at 07/14/16 1720  Gross per 24 hour  Intake              790 ml  Output                0 ml  Net              790 ml   Filed Weights   07/11/16 1949 07/12/16 0035  Weight: 180 lb (81.6 kg) 188 lb 0.8 oz (85.3 kg)    Telemetry    NSR - Personally Reviewed  ECG    None today   Physical Exam   GEN: No acute distress.   Neck: No JVD. C-collar in place  Cardiac: RRR, no murmurs, rubs, or gallops.  Respiratory: Clear to auscultation bilaterally. GI: Soft, nontender, non-distended  MS: Brace on L leg Neuro:  Nonfocal  Psych: Normal affect   Labs    Chemistry Recent Labs Lab 07/11/16 2127 07/12/16 0559 07/13/16 0316  NA 138 140 140  K 3.8 3.9 3.9  CL 101 106  106  CO2 26 25 28   GLUCOSE 135* 168* 118*  BUN 17 17 12   CREATININE 1.03* 0.93 0.90  CALCIUM 9.2 9.0 8.7*  GFRNONAA 51* 58* >60  GFRAA 59* >60 >60  ANIONGAP 11 9 6      Hematology Recent Labs Lab 07/11/16 2127 07/12/16 0559 07/13/16 0316  WBC 15.1* 11.7* 10.0  RBC 4.90 4.43 4.29  HGB 13.3 11.9* 11.5*  HCT 40.8 36.5 36.0  MCV 83.3 82.4 83.9  MCH 27.1 26.9 26.8  MCHC 32.6 32.6 31.9  RDW 15.3 15.3 16.0*  PLT 324 299 280    Cardiac EnzymesNo results for input(s): TROPONINI in the last 168 hours.  Recent Labs Lab 07/11/16 2141  TROPIPOC 0.00     BNPNo results for input(s): BNP, PROBNP in the last 168 hours.   DDimer No results for input(s): DDIMER in the last 168 hours.   Radiology    No results found.  Cardiac Studies   Pending echo  Patient Profile     76 y.o. female with hx of HTN, HLD, depression and dementia presents fall and found to multiple fractures.   Assessment & Plan    1. Fall (on) (from) other stairs and steps, initial encounter - Seems mechanical fall. However, history unreliable given dementia. No orthostatic. Orthostatic VS for the past 24 hrs:  BP- Lying Pulse- Lying BP- Sitting Pulse- Sitting BP- Standing at 0 minutes Pulse- Standing at 0 minutes  07/14/16 1417 162/72 80 151/74 82 148/76 94   2. SVT - Intermittent small burst of SVT. Asymptomatic. No event overnight. Mg, K, thyroid function are normal. Pending echo (spoke to echo department - on schedule today).  Will get 30 days monitor @ discharge.      Signed, Leanor Kail, PA  07/15/2016, 9:53 AM

## 2016-07-15 NOTE — Progress Notes (Signed)
Physical Therapy Treatment Patient Details Name: Teresa Francis MRN: [id number] DOB: [birth year] Today's Date: 07/15/2016    History of Present Illness  Pt admitted on 07/11/16 s/p fall with bilateral patellar fx, 6th rib fx, Rt orbital wall fx , C2 and C3 fx.    PT Comments    Pt progressing towards goals. Completed stair training this session and pt requiring min A for steadying. Current recommendations remain appropriate. Will continue to follow to maximize functional mobility independence.    Follow Up Recommendations  Home health PT;Supervision for mobility/OOB (HHOT and HHaide)     Equipment Recommendations  Rolling walker with 5" wheels;3in1 (PT) (HHOT to assess for tub bench )    Recommendations for Other Services       Precautions / Restrictions Precautions Precautions: Fall;Cervical;Knee Precaution Booklet Issued: Yes (comment) Required Braces or Orthoses: Cervical Brace Cervical Brace: At all times;Hard collar Other Brace/Splint: Bledsoe brace on LLE Restrictions Weight Bearing Restrictions: Yes RLE Weight Bearing: Weight bearing as tolerated LLE Weight Bearing: Weight bearing as tolerated    Mobility  Bed Mobility Overal bed mobility: Needs Assistance Bed Mobility: Supine to Sit     Supine to sit: Min guard;HOB elevated     General bed mobility comments: Min guard for steadying during trunk elevation. Required extended time to complete.   Transfers Overall transfer level: Needs assistance Equipment used: Rolling walker (2 wheeled) Transfers: Sit to/from Stand Sit to Stand: Min guard         General transfer comment: Min guard for safety. Verbal cues for BUE placement during transfer.   Ambulation/Gait Ambulation/Gait assistance: Min guard Ambulation Distance (Feet): 10 Feet Assistive device: Rolling walker (2 wheeled) Gait Pattern/deviations: Step-to pattern;Decreased stride length Gait velocity: Decreased  Gait velocity interpretation: Below  normal speed for age/gender General Gait Details: Pt requesting to go to bathroom upon entry. Verbal cues for upright posture and proper proximity to device.    Stairs Stairs: Yes   Stair Management: Two rails;Step to pattern;Forwards Number of Stairs: 2 General stair comments: Pt reporting she has B hand rails at home, unable to confirm with family. Educated about appropriate LE sequencing for stair mangement and for use of step to pattern. Left handout in room about stair navigation with RW in case pt does not have hand rails at home.   Wheelchair Mobility    Modified Rankin (Stroke Patients Only)       Balance Overall balance assessment: Needs assistance Sitting-balance support: No upper extremity supported Sitting balance-Leahy Scale: Good     Standing balance support: Bilateral upper extremity supported Standing balance-Leahy Scale: Poor Standing balance comment: Pt needed bil UE support with RW.              High level balance activites: Backward walking High Level Balance Comments: Backward walking to recliner. Min guard assist with verbal cues for safety.             Cognition Arousal/Alertness: Awake/alert Behavior During Therapy: WFL for tasks assessed/performed Overall Cognitive Status: Within Functional Limits for tasks assessed                                        Exercises      General Comments General comments (skin integrity, edema, etc.): No family present during session. Notified RN of void. Pt requiring min guard for toilet transfer.       Pertinent Vitals/Pain  Pain Assessment: Faces Faces Pain Scale: Hurts little more Pain Location: Bil Knees Pain Descriptors / Indicators: Discomfort Pain Intervention(s): Limited activity within patient's tolerance;Monitored during session;Repositioned    Home Living                      Prior Function            PT Goals (current goals can now be found in the care plan  section) Acute Rehab PT Goals Patient Stated Goal: To return home with my daughter PT Goal Formulation: With patient Time For Goal Achievement: 07/28/16 Potential to Achieve Goals: Good Progress towards PT goals: Progressing toward goals    Frequency    Min 5X/week      PT Plan Current plan remains appropriate    Co-evaluation              AM-PAC PT "6 Clicks" Daily Activity  Outcome Measure  Difficulty turning over in bed (including adjusting bedclothes, sheets and blankets)?: A Little Difficulty moving from lying on back to sitting on the side of the bed? : A Little Difficulty sitting down on and standing up from a chair with arms (e.g., wheelchair, bedside commode, etc,.)?: Total Help needed moving to and from a bed to chair (including a wheelchair)?: A Little Help needed walking in hospital room?: A Little Help needed climbing 3-5 steps with a railing? : A Little 6 Click Score: 16    End of Session Equipment Utilized During Treatment: Gait belt;Cervical collar;Other (comment) (L bledsoe brace) Activity Tolerance: Patient tolerated treatment well Patient left: in chair;with call bell/phone within reach;with chair alarm set Nurse Communication: Mobility status PT Visit Diagnosis: Difficulty in walking, not elsewhere classified (R26.2);Muscle weakness (generalized) (M62.81)     Time: 8341-9622 PT Time Calculation (min) (ACUTE ONLY): 24 min  Charges:  $Gait Training: 23-37 mins                    G Codes:       Nicky Pugh, PT, DPT  Acute Rehabilitation Services  Pager: [phone]    Army Melia 07/15/2016, 2:04 PM

## 2016-07-15 NOTE — Progress Notes (Signed)
Progress Note    GWENITH TSCHIDA  KTG:[id number] DOB: 03/06/1941  DOA: 07/11/2016 PCP: Webb Silversmith, NP    Brief Narrative:   Reason for consult: Follow-up hypertension/syncope  Medical records reviewed and are as summarized below:  Teresa Francis is an 76 y.o. female with a PMH of mild dementia, and hypertension who was admitted by the trauma service after she suffered from a mechanical fall resulting in facial fractures. Initial workup in the ED was unremarkable. EKG showed sinus bradycardia.  Assessment/Plan:   Principal Problem:   Fall (on) (from) other stairs and steps/Syncope No clear evidence of a syncopal event and the patient has memory impairment precluding obtaining a accurate history. Episodes of SVT with rates 100s-120s noted on telemetry. 2-D echocardiogram With EF 55-60 percent, no regional wall motion abnormality, and normal diastolic parameters.  Active Problems:   Essential hypertension Continue labetalol as needed. Systolic blood pressures running high in the 150s-170s range.    Alzheimer's dementia Continue Aricept and Namenda.    Trauma: C2/C3 fractures, sixth rib fracture, bilateral patellar fractures, right orbital wall fracture without impingement and facial laceration  Management per trauma team.    ? Orthostatic hypotension  BP- Lying Pulse- Lying BP- Sitting Pulse- Sitting BP- Standing at 0 minutes Pulse- Standing at 0 minutes  07/14/16 1417 162/72 80 151/74 82 148/76 94   No evidence of significant orthostasis.    SVT (supraventricular tachycardia) (HCC) Thyroid studies WNL.   Family Communication/Anticipated D/C date and plan/Code Status   DVT prophylaxis: Lovenox ordered. Code Status: Full Code.  Family Communication: No family at bedside. Disposition Plan: Likely will need SNF.   Subjective:   No complaints.  Objective:    Vitals:   07/13/16 1951 07/14/16 1534 07/14/16 2133 07/15/16 0458  BP: 140/64 (!) 177/63 (!)  175/74 (!) 151/59  Pulse: 67  77 66  Resp: 16 16 17 17   Temp: 98.4 F (36.9 C) 98.5 F (36.9 C) 97.5 F (36.4 C) 98.3 F (36.8 C)  TempSrc: Oral  Oral Oral  SpO2: 96% 98% 99% 94%  Weight:      Height:        Intake/Output Summary (Last 24 hours) at 07/15/16 0804 Last data filed at 07/14/16 1720  Gross per 24 hour  Intake             1150 ml  Output                0 ml  Net             1150 ml   Filed Weights   07/11/16 1949 07/12/16 0035  Weight: 81.6 kg (180 lb) 85.3 kg (188 lb 0.8 oz)    Exam: General exam: Appears calm and comfortable. Neck brace on. Respiratory system: Clear to auscultation. Respiratory effort normal. Cardiovascular system: S1 & S2 heard, RRR. No JVD,  rubs, gallops or clicks. No murmurs. Gastrointestinal system: Abdomen is nondistended, soft and nontender. No organomegaly or masses felt. Normal bowel sounds heard. Central nervous system: Pleasantly confused/disoriented. No focal neurological deficits. Extremities: No clubbing,  or cyanosis. No edema. Left foot appliance. Skin: No rashes, lesions or ulcers. Psychiatry: Judgement and insight appear impaired. Mood & affect appropriate.   Data Reviewed:   I have personally reviewed following labs and imaging studies:  Labs: Basic Metabolic Panel:  Recent Labs Lab 07/11/16 2127 07/12/16 0559 07/13/16 0316 07/15/16 0507  NA 138 140 140  --   K 3.8 3.9  3.9  --   CL 101 106 106  --   CO2 26 25 28   --   GLUCOSE 135* 168* 118*  --   BUN 17 17 12   --   CREATININE 1.03* 0.93 0.90  --   CALCIUM 9.2 9.0 8.7*  --   MG  --   --   --  2.0   GFR Estimated Creatinine Clearance: 61.6 mL/min (by C-G formula based on SCr of 0.9 mg/dL). Liver Function Tests: No results for input(s): AST, ALT, ALKPHOS, BILITOT, PROT, ALBUMIN in the last 168 hours. No results for input(s): LIPASE, AMYLASE in the last 168 hours. No results for input(s): AMMONIA in the last 168 hours. Coagulation profile No results for  input(s): INR, PROTIME in the last 168 hours.  CBC:  Recent Labs Lab 07/11/16 2127 07/12/16 0559 07/13/16 0316  WBC 15.1* 11.7* 10.0  NEUTROABS 12.0*  --   --   HGB 13.3 11.9* 11.5*  HCT 40.8 36.5 36.0  MCV 83.3 82.4 83.9  PLT 324 299 280   Cardiac Enzymes: No results for input(s): CKTOTAL, CKMB, CKMBINDEX, TROPONINI in the last 168 hours. BNP (last 3 results) No results for input(s): PROBNP in the last 8760 hours. CBG: No results for input(s): GLUCAP in the last 168 hours. D-Dimer: No results for input(s): DDIMER in the last 72 hours. Hgb A1c: No results for input(s): HGBA1C in the last 72 hours. Lipid Profile: No results for input(s): CHOL, HDL, LDLCALC, TRIG, CHOLHDL, LDLDIRECT in the last 72 hours. Thyroid function studies:  Recent Labs  07/15/16 0507  TSH 3.116   Anemia work up: No results for input(s): VITAMINB12, FOLATE, FERRITIN, TIBC, IRON, RETICCTPCT in the last 72 hours. Sepsis Labs:  Recent Labs Lab 07/11/16 2127 07/12/16 0559 07/13/16 0316  WBC 15.1* 11.7* 10.0    Microbiology No results found for this or any previous visit (from the past 240 hour(s)).  Radiology: No results found.  Medications:   . cholecalciferol  1,000 Units Oral Daily  . docusate sodium  100 mg Oral BID  . donepezil  10 mg Oral QHS  . enoxaparin (LOVENOX) injection  40 mg Subcutaneous Q24H  . Melatonin  1 tablet Oral QHS,MR X 1  . memantine  5 mg Oral BID  . pantoprazole  40 mg Oral BID  . sertraline  50 mg Oral Daily  . simvastatin  40 mg Oral q1800  . vitamin B-12  1,000 mcg Oral Daily   Continuous Infusions: . dextrose 5 % and 0.9% NaCl Stopped (07/14/16 1100)    Medical decision making is moderately complex therefore this is a level 2 visit. (> 3 problem points, 2 data points, moderate risk)    LOS: 4 days   RAMA,CHRISTINA  [facility] Pager [phone]. If unable to reach me by pager, please call my cell phone at [phone].  *Please refer to  [email or web address], password [email or web address] to get updated schedule on who will round on this patient, as hospitalists switch teams weekly. If 7PM-7AM, please contact night-coverage at [email or web address], password [email or web address] for any overnight needs.  07/15/2016, 8:04 AM

## 2016-07-15 NOTE — Progress Notes (Signed)
Physical Therapy Treatment Patient Details Name: Teresa Francis MRN: [id number] DOB: 1940/06/16 Today's Date: 07/15/2016    History of Present Illness  Pt admitted on 07/11/16 s/p fall with bilateral patellar fx, 6th rib fx, Rt orbital wall fx , C2 and C3 fx.    PT Comments    Pt admitted with above diagnosis. Pt currently with functional limitations due to balance and endurance deficits. Pt was able to ambulate with RW with min guard assist and cues for safety.  Called daughter who states pt with Alzheimers and always needs supervision which husband can provide. She states husband just can't provide physical assist as he has heart problems.  Explained to wife that [facility] f/u recommended but they aren't there but a few hours and not every day.  Daughter verbalizes understanding.   Could not address steps as tech came to take pt for test.  Will try and return in pm.    Pt will benefit from skilled PT to increase their independence and safety with mobility to allow discharge to the venue listed below.     Follow Up Recommendations  Home health PT;Supervision for mobility/OOB (HHOT and HHAide)     Equipment Recommendations  Rolling walker with 5" wheels;3in1 (PT) (HHOT to assess for tub bench)           Precautions / Restrictions Precautions Precautions: Fall;Cervical;Knee Precaution Booklet Issued: Yes (comment) Required Braces or Orthoses: Cervical Brace Cervical Brace: At all times;Hard collar Other Brace/Splint: Bledsoe brace on LLE Restrictions Weight Bearing Restrictions: Yes RLE Weight Bearing: Weight bearing as tolerated LLE Weight Bearing: Weight bearing as tolerated    Mobility  Bed Mobility Overal bed mobility: Needs Assistance Bed Mobility: Sit to Supine       Sit to supine: Min guard   General bed mobility comments: Pt able to get LEs into bed on her own.   Transfers Overall transfer level: Needs assistance Equipment used: Rolling walker (2 wheeled) Transfers:  Sit to/from Stand Sit to Stand: Min guard         General transfer comment: Pt somewhat impulsive with movement but safe.  No cues needed.  Ambulation/Gait Ambulation/Gait assistance: Min guard Ambulation Distance (Feet): 30 Feet Assistive device: Rolling walker (2 wheeled) Gait Pattern/deviations: Step-to pattern;Decreased stride length   Gait velocity interpretation: <1.8 ft/sec, indicative of risk for recurrent falls General Gait Details: Pt needed a few cues to slow down but overall safe with RW.  Pt in controlled environment.         Balance Overall balance assessment: Needs assistance Sitting-balance support: No upper extremity supported Sitting balance-Leahy Scale: Good     Standing balance support: Bilateral upper extremity supported Standing balance-Leahy Scale: Poor Standing balance comment: Pt needed bil UE support with RW.              High level balance activites: Backward walking;Direction changes;Turns;Sudden stops High Level Balance Comments: Pt was able to back up with cues.  Min guard assist for safety.             Cognition Arousal/Alertness: Awake/alert Behavior During Therapy: WFL for tasks assessed/performed Overall Cognitive Status: Within Functional Limits for tasks assessed                                         Pertinent Vitals/Pain Pain Assessment: Faces Faces Pain Scale: Hurts little more Pain Location: Bil Knees Pain Descriptors /  Indicators: Discomfort Pain Intervention(s): Limited activity within patient's tolerance;Monitored during session;Repositioned    Home Living                                  PT Goals (current goals can now be found in the care plan section) Progress towards PT goals: Progressing toward goals    Frequency    Min 5X/week      PT Plan Current plan remains appropriate                  AM-PAC PT "6 Clicks" Daily Activity  Outcome Measure  Difficulty  turning over in bed (including adjusting bedclothes, sheets and blankets)?: A Little Difficulty moving from lying on back to sitting on the side of the bed? : A Little Difficulty sitting down on and standing up from a chair with arms (e.g., wheelchair, bedside commode, etc,.)?: A Little Help needed moving to and from a bed to chair (including a wheelchair)?: A Little Help needed walking in hospital room?: A Little Help needed climbing 3-5 steps with a railing? : Total 6 Click Score: 16    End of Session Equipment Utilized During Treatment: Gait belt;Cervical collar;Other (comment) (L Bledsoe brace) Activity Tolerance: Patient tolerated treatment well Patient left: in bed;with call bell/phone within reach (Echo came to take pt for test) Nurse Communication: Mobility status PT Visit Diagnosis: Difficulty in walking, not elsewhere classified (R26.2);Muscle weakness (generalized) (M62.81)     Time: 0175-1025 PT Time Calculation (min) (ACUTE ONLY): 17 min  Charges:  $Gait Training: 8-22 mins                    G Codes:       Timesha Cervantez,PT Acute Rehabilitation [phone] (pager)    Denice Paradise 07/15/2016, 11:01 AM

## 2016-07-15 NOTE — Consult Note (Signed)
[facility] CM Primary Care Navigator  07/15/2016  Teresa Francis Nov 21, 1940 [id number]     Met with patient, daughter Teresa Francis) and son in-law Teresa Francis) at the bedside to identify possible discharge needs.  Daughter reports that patient has history of Alzheimer's dementia. She also states that patient fell down the stairs and struck her face which had led to this admission. Daughter endorses Dr. Deborra Medina Talla/ Webb Silversmith, NP with [facility] as the primary care provider.   Patient's daughter shared using [facility] and [facility] in [place] to obtain medications without any problem.   Daughter manages patient's medications at home using "pill box" system weekly.   Per daughter, she provides transportation to patient's doctors' appointments.  Patient lives with daughter and son in-law and was independent prior to admission. According to daughter, she works and her husband is on disability and unable to physically assist patient but will able to provide supervision while patient's daughter works. Daughter accepted offer for [facility] list of personal care services that she can use as a resourcewhenever needed and was made aware of the need to pay out of the pocket for it.  Anticipated discharge plan is home with home health services per daughter.  Patient and daughter voiced understanding to call primary care provider's office when she returns home, for a post discharge follow-up appointment within a week or sooner if needed. Patient letter (with PCP's contact number) was provided as their reminder.  Daughter denies any health management needs or concerns for patient at this time. [facility] care management contact information provided for future needs that may arise.    For questions, please contact:  Dannielle Huh, BSN, RN- [facility] Primary Care Navigator  Telephone: [phone] [place]

## 2016-07-15 NOTE — Progress Notes (Signed)
Pt 's daughter called and expressed her frustrations that no one from the trauma team has called her despite leaving numerous messages requesting a call. PA Will paged and notified of daughter's concern

## 2016-07-15 NOTE — Progress Notes (Signed)
  Echocardiogram 2D Echocardiogram has been performed.  Teresa Francis 07/15/2016, 10:46 AM

## 2016-07-15 NOTE — Progress Notes (Signed)
Chief complaint: Fall.     Subjective: She is very alert and just walked with OT to BR.  She has some pain in her neck and  Knee.  She remembers year and [place], from [place], does not know hospital name. She know president but can't remember his name, Voted for the last president, not sure about this one.    Objective: Vital signs in last 24 hours: Temp:  [97.5 F (36.4 C)-98.5 F (36.9 C)] 98.3 F (36.8 C) (05/02 0458) Pulse Rate:  [66-77] 66 (05/02 0458) Resp:  [16-17] 17 (05/02 0458) BP: (151-177)/(59-74) 151/59 (05/02 0458) SpO2:  [94 %-99 %] 94 % (05/02 0458) Last BM Date: 07/11/16 960 PO 190 IV Voided x 3 afebrile, VSS   No orthostatic changes No labs, no films Intake/Output from previous day: 05/01 0701 - 05/02 0700 In: 1150 [P.O.:960; I.V.:190] Out: -  Intake/Output this shift: No intake/output data recorded.  General appearance: alert and cooperative Head: Normocephalic, without obvious abnormality, brused and abrazed forehead, midright side of fore head.  Eyes: Normocephalic, without obvious abnormality, brused/abrazed area mid forehead Resp: clear to auscultation bilaterally Cardio: regular rate and rhythm, S1, S2 normal, no murmur, click, rub or gallop GI: soft, non-tender; bowel sounds normal; no masses,  no organomegaly Extremities: brace on left leg, knee still sore.    Lab Results:   Recent Labs  07/13/16 0316  WBC 10.0  HGB 11.5*  HCT 36.0  PLT 280    BMET  Recent Labs  07/13/16 0316  NA 140  K 3.9  CL 106  CO2 28  GLUCOSE 118*  BUN 12  CREATININE 0.90  CALCIUM 8.7*   PT/INR No results for input(s): LABPROT, INR in the last 72 hours.  No results for input(s): AST, ALT, ALKPHOS, BILITOT, PROT, ALBUMIN in the last 168 hours.   Lipase  No results found for: LIPASE   Studies/Results: No results found.  Medications: . cholecalciferol  1,000 Units Oral Daily  . docusate sodium  100 mg Oral BID  . donepezil  10 mg Oral QHS  .  enoxaparin (LOVENOX) injection  40 mg Subcutaneous Q24H  . Melatonin  1 tablet Oral QHS,MR X 1  . memantine  5 mg Oral BID  . pantoprazole  40 mg Oral BID  . sertraline  50 mg Oral Daily  . simvastatin  40 mg Oral q1800  . vitamin B-12  1,000 mcg Oral Daily    Assessment/Plan Fall Question syncopal episode - syncopal workup per internal medicine; tele monitoring, ECHO pending, consider event monitor at discharge if nothing noted on telemetry during hospital stay  - so far no recommendation for change in Rx C2 and C3 fractures - nonop per Dr. Cyndy Freeze, c-collar, f/u 4 weeks 6 th rib fracture - pulmonary toilet/IS Bilateral patellar fractures - per Dr. Fransisca Connors right leg/right knee brace Right orbital wall fracture without impingement and facial laceration - full liquid diet per Dr. Marla Roe HTN - BP stable, labetalol PRN  Dementia - home meds HLD - simvastatin GERD - protonix FEN - full liquid, add colace BID VTE - lovenox, SCDs Plan - PT/OT recommending home health OT and PT, syncope W/U by medicine Echo still pending, I think they just took her for that. She lives with her daughter and son in [place]. He son in law is on SSI and home during the day. I have ordered a walker and 3:1 for home use.  Getting close to discharge.   How long on  Full liquids, Follow up with Plastic's?, Dr. Erlinda Hong ?  Dr. Cyndy Freeze  4 weeks.      LOS: 4 days    Carder Yin 07/15/2016 [phone]

## 2016-07-15 NOTE — Care Management Important Message (Signed)
Important Message  Patient Details  Name: Teresa Francis MRN: [id number] Date of Birth: November 12, 1940   Medicare Important Message Given:  Yes    Aliahna Statzer Montine Circle 07/15/2016, 11:55 AM

## 2016-07-16 ENCOUNTER — Other Ambulatory Visit: Payer: Self-pay | Admitting: Emergency Medicine

## 2016-07-16 DIAGNOSIS — Z23 Encounter for immunization: Secondary | ICD-10-CM | POA: Diagnosis not present

## 2016-07-16 DIAGNOSIS — I471 Supraventricular tachycardia: Secondary | ICD-10-CM

## 2016-07-16 DIAGNOSIS — I479 Paroxysmal tachycardia, unspecified: Secondary | ICD-10-CM

## 2016-07-16 MED ORDER — OXYCODONE HCL 5 MG PO TABS: 5.0000 mg | ORAL_TABLET | Freq: Four times a day (QID) | ORAL | 0 refills | Status: DC | PRN

## 2016-07-16 MED ORDER — ATORVASTATIN CALCIUM 10 MG PO TABS
10.0000 mg | ORAL_TABLET | Freq: Every day | ORAL | Status: DC
  Administered 2016-07-16: 10 mg via ORAL
  Filled 2016-07-16: qty 1

## 2016-07-16 MED ORDER — ACETAMINOPHEN 325 MG PO TABS: 650.0000 mg | ORAL_TABLET | Freq: Four times a day (QID) | ORAL | Status: AC | PRN

## 2016-07-16 MED ORDER — AMLODIPINE BESYLATE 2.5 MG PO TABS
2.5000 mg | ORAL_TABLET | Freq: Every day | ORAL | Status: DC
  Administered 2016-07-16: 2.5 mg via ORAL
  Filled 2016-07-16: qty 1

## 2016-07-16 MED ORDER — AMLODIPINE BESYLATE 2.5 MG PO TABS: 2.5000 mg | ORAL_TABLET | Freq: Every day | ORAL | 0 refills | Status: DC

## 2016-07-16 NOTE — Progress Notes (Signed)
Progress Note    Teresa Francis  SWF:[id number] DOB: 12-Aug-1940  DOA: 07/11/2016 PCP: Webb Silversmith, NP    Brief Narrative:   Reason for consult: Follow-up hypertension/syncope  Medical records reviewed and are as summarized below:  Teresa Francis is an 76 y.o. female with a PMH of mild dementia, and hypertension who was admitted by the trauma service after she suffered from a mechanical fall resulting in facial fractures. Initial workup in the ED was unremarkable. EKG showed sinus bradycardia.  Assessment/Plan:   Principal Problem:   Fall (on) (from) other stairs and steps/Syncope No clear evidence of a syncopal event and the patient has memory impairment precluding obtaining a accurate history. Episodes of SVT with rates 100s-120s noted on telemetry. 2-D echocardiogram With EF 55-60 percent, no regional wall motion abnormality, and normal diastolic parameters.Cardiology recommends outpatient 30 day event monitor. Feels adding a beta blocker will make her feel worse.  Active Problems:   Essential hypertension Continue labetalol as needed. Systolic blood pressures running high in the 146-160 range. Low dose amlodipine started.    Alzheimer's dementia Continue Aricept and Namenda.    Trauma: C2/C3 fractures, sixth rib fracture, bilateral patellar fractures, right orbital wall fracture without impingement and facial laceration  Management per trauma team.    ? Orthostatic hypotension  BP- Lying Pulse- Lying BP- Sitting Pulse- Sitting BP- Standing at 0 minutes Pulse- Standing at 0 minutes  07/14/16 1417 162/72 80 151/74 82 148/76 94   No evidence of significant orthostasis.    SVT (supraventricular tachycardia) (HCC) Thyroid studies WNL.  *Nothing further to add on this patient.  Cardiologist managing evaluation of syncope and has started anti-hypertensive therapy.  Will sign off.*Please call us back if there are further questions or if we can be of any further  assistance.  Family Communication/Anticipated D/C date and plan/Code Status   DVT prophylaxis: Lovenox ordered. Code Status: Full Code.  Family Communication: No family at bedside. Disposition Plan: Per primary team.   Subjective:   No complaints.Pleasantly confused, denies pain, dyspnea, nausea/vomiting, dizziness.  Objective:    Vitals:   07/15/16 0458 07/15/16 1508 07/15/16 2142 07/16/16 0420  BP: (!) 151/59 (!) 160/72 (!) 153/60 (!) 146/69  Pulse: 66 68 70 72  Resp: 17 16 18 16   Temp: 98.3 F (36.8 C) 98.8 F (37.1 C) 99.2 F (37.3 C) 98.6 F (37 C)  TempSrc: Oral  Oral Oral  SpO2: 94%  94% 97%  Weight:      Height:        Intake/Output Summary (Last 24 hours) at 07/16/16 0807 Last data filed at 07/15/16 2030  Gross per 24 hour  Intake              236 ml  Output                0 ml  Net              236 ml   Filed Weights   07/11/16 1949 07/12/16 0035  Weight: 81.6 kg (180 lb) 85.3 kg (188 lb 0.8 oz)    Exam: General exam: Appears calm and comfortable. Neck brace on.Sitting up in chair today.  Respiratory system: Clear to auscultation. Respiratory effort normal. Cardiovascular system: S1 & S2 heard, RRR. No JVD,  rubs, gallops or clicks. No murmurs. Gastrointestinal system: Abdomen is nondistended, soft and nontender. No organomegaly or masses felt. Normal bowel sounds heard. Central nervous system: Pleasantly confused/disoriented. No focal neurological  deficits. Extremities: No clubbing,  or cyanosis. No edema. Left foot appliance. Skin: Right periorbital ecchymosis. Psychiatry: Judgement and insight appear impaired. Mood & affect appropriate.   Data Reviewed:   I have personally reviewed following labs and imaging studies:  Labs: Basic Metabolic Panel:  Recent Labs Lab 07/11/16 2127 07/12/16 0559 07/13/16 0316 07/15/16 0507  NA 138 140 140  --   K 3.8 3.9 3.9  --   CL 101 106 106  --   CO2 26 25 28   --   GLUCOSE 135* 168* 118*  --   BUN  17 17 12   --   CREATININE 1.03* 0.93 0.90  --   CALCIUM 9.2 9.0 8.7*  --   MG  --   --   --  2.0   GFR Estimated Creatinine Clearance: 61.6 mL/min (by C-G formula based on SCr of 0.9 mg/dL). Liver Function Tests: No results for input(s): AST, ALT, ALKPHOS, BILITOT, PROT, ALBUMIN in the last 168 hours. No results for input(s): LIPASE, AMYLASE in the last 168 hours. No results for input(s): AMMONIA in the last 168 hours. Coagulation profile No results for input(s): INR, PROTIME in the last 168 hours.  CBC:  Recent Labs Lab 07/11/16 2127 07/12/16 0559 07/13/16 0316  WBC 15.1* 11.7* 10.0  NEUTROABS 12.0*  --   --   HGB 13.3 11.9* 11.5*  HCT 40.8 36.5 36.0  MCV 83.3 82.4 83.9  PLT 324 299 280   Cardiac Enzymes: No results for input(s): CKTOTAL, CKMB, CKMBINDEX, TROPONINI in the last 168 hours. BNP (last 3 results) No results for input(s): PROBNP in the last 8760 hours. CBG: No results for input(s): GLUCAP in the last 168 hours. D-Dimer: No results for input(s): DDIMER in the last 72 hours. Hgb A1c: No results for input(s): HGBA1C in the last 72 hours. Lipid Profile: No results for input(s): CHOL, HDL, LDLCALC, TRIG, CHOLHDL, LDLDIRECT in the last 72 hours. Thyroid function studies:  Recent Labs  07/15/16 0507  TSH 3.116   Anemia work up: No results for input(s): VITAMINB12, FOLATE, FERRITIN, TIBC, IRON, RETICCTPCT in the last 72 hours. Sepsis Labs:  Recent Labs Lab 07/11/16 2127 07/12/16 0559 07/13/16 0316  WBC 15.1* 11.7* 10.0    Microbiology No results found for this or any previous visit (from the past 240 hour(s)).  Radiology: No results found.  Medications:   . cholecalciferol  1,000 Units Oral Daily  . docusate sodium  100 mg Oral BID  . donepezil  10 mg Oral QHS  . enoxaparin (LOVENOX) injection  40 mg Subcutaneous Q24H  . Melatonin  1 tablet Oral QHS,MR X 1  . memantine  5 mg Oral BID  . pantoprazole  40 mg Oral BID  . sertraline  50 mg  Oral Daily  . simvastatin  40 mg Oral q1800  . vitamin B-12  1,000 mcg Oral Daily   Continuous Infusions: . dextrose 5 % and 0.9% NaCl Stopped (07/14/16 1100)    Medical decision making is moderately complex therefore this is a level 2 visit. (> 3 problem points, 2 data points, moderate risk)    LOS: 5 days   Tarina Volk  [facility] Pager [phone]. If unable to reach me by pager, please call my cell phone at [phone].  *Please refer to [email or web address], password [email or web address] to get updated schedule on who will round on this patient, as hospitalists switch teams weekly. If 7PM-7AM, please contact night-coverage at [email or web address], password [email or web address] for any overnight needs.  07/16/2016, 8:07  AM

## 2016-07-16 NOTE — Progress Notes (Signed)
[facility] Progress Note     Subjective: CC: fall Denies pain this AM. Tolerating diet. Urinating and having bowel function. Mobilizing with therapies.  Afebrile,VSS Objective: Vital signs in last 24 hours: Temp:  [98.6 F (37 C)-99.2 F (37.3 C)] 98.6 F (37 C) (05/03 0420) Pulse Rate:  [68-72] 72 (05/03 0420) Resp:  [16-18] 16 (05/03 0420) BP: (146-160)/(60-72) 146/69 (05/03 0420) SpO2:  [94 %-97 %] 97 % (05/03 0420) Last BM Date: 07/11/16  Intake/Output from previous day: 05/02 0701 - 05/03 0700 In: 236 [P.O.:236] Out: -  Intake/Output this shift: No intake/output data recorded.  PE: Gen:  Alert, NAD, pleasant and cooperative Eyes: EOM's in tact, no scleral icterus  Head: normocephalic; traumatic; forehead abrasion   Card:  Regular rate and rhythm, pedal pulses 2+ BL Pulm:  Normal effort, clear to auscultation bilaterally Abd: Soft, non-tender, non-distended, bowel sounds present in all 4 quadrants Skin: warm and dry, no rashes   Lab Results:  No results for input(s): WBC, HGB, HCT, PLT in the last 72 hours. BMET No results for input(s): NA, K, CL, CO2, GLUCOSE, BUN, CREATININE, CALCIUM in the last 72 hours. PT/INR No results for input(s): LABPROT, INR in the last 72 hours. CMP     Component Value Date/Time   NA 140 07/13/2016 0316   K 3.9 07/13/2016 0316   CL 106 07/13/2016 0316   CO2 28 07/13/2016 0316   GLUCOSE 118 (H) 07/13/2016 0316   BUN 12 07/13/2016 0316   CREATININE 0.90 07/13/2016 0316   CALCIUM 8.7 (L) 07/13/2016 0316   PROT 6.9 11/06/2015 1603   ALBUMIN 4.2 11/06/2015 1603   AST 19 11/06/2015 1603   ALT 10 11/06/2015 1603   ALKPHOS 94 11/06/2015 1603   BILITOT 0.4 11/06/2015 1603   GFRNONAA >60 07/13/2016 0316   GFRAA >60 07/13/2016 0316   Assessment/Plan Fall Question syncopal episode- syncopal workup per internal medicine; tele monitoring, Echo (LVF 55-60%, trivial aortic/mitral regurg), 30 day event monitor at  discharge.  C2 and C3 fractures- nonop per Dr. Cyndy Freeze, c-collar, f/u 4 weeks 6 th rib fracture- pulmonary toilet/IS Bilateral patellar fractures - per Dr. Fransisca Connors right leg/right knee brace Right orbital wall fracture without impingement and facial laceration- full liquid diet per Dr. Marla Roe HTN- BP stable, labetalol PRN  Dementia- home meds HLD- simvastatin GERD- protonix FEN - full liquid, colace BID VTE - lovenox, SCDs Plan - Will touch base with cardiology and request a sign off note with recommendations/any medication changes/event monitor placement. D/C today with HH PT/OT/aide. Will require F/U with ortho, plastics, neuro, and cardiology.    LOS: 5 days    Jill Alexanders , [facility] 07/16/2016, 8:36 AM Pager: [phone] Consults: [phone] Mon-Fri 7:00 am-4:30 pm Sat-Sun 7:00 am-11:30 am

## 2016-07-16 NOTE — Progress Notes (Signed)
Physical Therapy Treatment Patient Details Name: Teresa Francis MRN: [id number] DOB: 1940-05-02 Today's Date: 07/16/2016    History of Present Illness  Pt admitted on 07/11/16 s/p fall with bilateral patellar fx, 6th rib fx, Rt orbital wall fx , C2 and C3 fx.    PT Comments    The patient is mobilizing well with RW, requires reminders to stay within it for safety. No family present. Patient will need a RW and #-in 1. Continue PT while in acute care.  Follow Up Recommendations   HHPT, 24/7 supervision     Equipment Recommendations  Rolling walker with 5" wheels;3in1 (PT)    Recommendations for Other Services       Precautions / Restrictions Precautions Precautions: Fall;Cervical;Knee Precaution Comments: reviewed position of the Bledsoe Required Braces or Orthoses: Cervical Brace Cervical Brace: At all times;Hard collar Other Brace/Splint: Bledsoe brace on LLE Restrictions RLE Weight Bearing: Weight bearing as tolerated LLE Weight Bearing: Weight bearing as tolerated    Mobility  Bed Mobility Overal bed mobility: Needs Assistance Bed Mobility: Supine to Sit       Sit to supine: Supervision   General bed mobility comments: patient managed right leg  Transfers Overall transfer level: Needs assistance Equipment used: Rolling walker (2 wheeled) Transfers: Sit to/from Stand Sit to Stand: Min guard         General transfer comment: Min guard for safety. Verbal cues for BUE placement during transfer.   Ambulation/Gait Ambulation/Gait assistance: Min guard Ambulation Distance (Feet): 10 Feet (then 40') Assistive device: Rolling walker (2 wheeled) Gait Pattern/deviations: Step-through pattern     General Gait Details: Pt requesting to go to bathroom upon entry. Verbal cues for upright posture and proper proximity to device. At times tended to walk away from the RW. Enciouraged to stay within. also cues to try not to lean too far forward  with head while on  [facility],   Stairs            Wheelchair Mobility    Modified Rankin (Stroke Patients Only)       Balance Overall balance assessment: Needs assistance         Standing balance support: Bilateral upper extremity supported Standing balance-Leahy Scale: Fair Standing balance comment: Able to stand and self pericare after toileting                            Cognition Arousal/Alertness: Awake/alert   Overall Cognitive Status: Within Functional Limits for tasks assessed                                 General Comments: patient did not recall any MD in this AM, although Trauma had been by.      Exercises      General Comments        Pertinent Vitals/Pain Pain Assessment: No/denies pain    Home Living                      Prior Function            PT Goals (current goals can now be found in the care plan section) Progress towards PT goals: Progressing toward goals    Frequency    Min 5X/week      PT Plan Current plan remains appropriate    Co-evaluation  AM-PAC PT "6 Clicks" Daily Activity  Outcome Measure  Difficulty turning over in bed (including adjusting bedclothes, sheets and blankets)?: None Difficulty moving from lying on back to sitting on the side of the bed? : None Difficulty sitting down on and standing up from a chair with arms (e.g., wheelchair, bedside commode, etc,.)?: A Little Help needed moving to and from a bed to chair (including a wheelchair)?: A Little Help needed walking in hospital room?: A Little Help needed climbing 3-5 steps with a railing? : A Little 6 Click Score: 20    End of Session Equipment Utilized During Treatment: Cervical collar;Other (comment) (Left Bledsoe)   Patient left: in chair;with call bell/phone within reach;with chair alarm set Nurse Communication: Mobility status PT Visit Diagnosis: Difficulty in walking, not elsewhere classified (R26.2);Muscle  weakness (generalized) (M62.81)     Time: 4356-8616 PT Time Calculation (min) (ACUTE ONLY): 27 min  Charges:  $Gait Training: 8-22 mins $Self Care/Home Management: 8-22                    G CodesTresa Endo PT [place], [place] 07/16/2016, 10:10 AM

## 2016-07-16 NOTE — Progress Notes (Signed)
Occupational Therapy Treatment Patient Details Name: Teresa Francis MRN: [id number] DOB: 04-Mar-1941 Today's Date: 07/16/2016    History of present illness  Pt admitted on 07/11/16 s/p fall with bilateral patellar fx, 6th rib fx, Rt orbital wall fx , C2 and C3 fx.   OT comments  Pt progressing well towards acute OT goals. Focus of session was functional transfers, review of ADL education and precautions. No family present during session. D/c plan remains appropriate.     Follow Up Recommendations  Home health OT;Supervision/Assistance - 24 hour    Equipment Recommendations  3 in 1 bedside commode    Recommendations for Other Services      Precautions / Restrictions Precautions Precautions: Fall;Cervical;Knee Precaution Comments: reviewed cervical precautions Required Braces or Orthoses: Cervical Brace Cervical Brace: At all times;Hard collar Other Brace/Splint: Bledsoe brace on LLE Restrictions Weight Bearing Restrictions: Yes RLE Weight Bearing: Weight bearing as tolerated LLE Weight Bearing: Weight bearing as tolerated       Mobility Bed Mobility               General bed mobility comments: up in chair  Transfers Overall transfer level: Needs assistance Equipment used: Rolling walker (2 wheeled) Transfers: Sit to/from Stand Sit to Stand: Min guard         General transfer comment: min guard for safety    Balance Overall balance assessment: Needs assistance Sitting-balance support: No upper extremity supported Sitting balance-Leahy Scale: Good     Standing balance support: Bilateral upper extremity supported Standing balance-Leahy Scale: Fair                             ADL either performed or assessed with clinical judgement   ADL Overall ADL's : Needs assistance/impaired                         Toilet Transfer: Min Marine scientist Details (indicate cue type and reason): BSC over toilet Toileting-  Clothing Manipulation and Hygiene: Supervision/safety;Sitting/lateral lean Toileting - Clothing Manipulation Details (indicate cue type and reason): anterior pericare in sitting position   Tub/Shower Transfer Details (indicate cue type and reason): walk-in shower, reviewed use of 3n1 as shower seat Functional mobility during ADLs: Min guard;Rolling walker General ADL Comments: Pt completed toilet transfer, pericare, and in-room mobility. Reviewed ADL compensatory strategies, precautions.      Vision       Perception     Praxis      Cognition Arousal/Alertness: Awake/alert Behavior During Therapy: WFL for tasks assessed/performed Overall Cognitive Status: Within Functional Limits for tasks assessed                                          Exercises     Shoulder Instructions       General Comments No family present during session.     Pertinent Vitals/ Pain       Pain Assessment: Faces Faces Pain Scale: Hurts a little bit Pain Descriptors / Indicators: Discomfort Pain Intervention(s): Monitored during session  Home Living                                          Prior Functioning/Environment  Frequency  Min 2X/week        Progress Toward Goals  OT Goals(current goals can now be found in the care plan section)  Progress towards OT goals: Progressing toward goals  Acute Rehab OT Goals Patient Stated Goal: To return home with my daughter OT Goal Formulation: With patient Time For Goal Achievement: 07/28/16 Potential to Achieve Goals: Good ADL Goals Pt Will Perform Grooming: with supervision;standing Pt Will Perform Upper Body Bathing: with supervision;standing;sitting Pt Will Perform Lower Body Bathing: with supervision;sit to/from stand;with adaptive equipment Pt Will Perform Upper Body Dressing: with supervision;sitting;standing Pt Will Perform Lower Body Dressing: with supervision;sit to/from stand;with  adaptive equipment Pt Will Transfer to Toilet: with supervision;ambulating;bedside commode Pt Will Perform Toileting - Clothing Manipulation and hygiene: with supervision;sit to/from stand Pt Will Perform Tub/Shower Transfer: with supervision;tub bench;ambulating  Plan Discharge plan remains appropriate    Co-evaluation                 AM-PAC PT "6 Clicks" Daily Activity     Outcome Measure                    End of Session Equipment Utilized During Treatment: Rolling walker;Other (comment);Cervical collar (L Bledsoe brace)  OT Visit Diagnosis: Other abnormalities of gait and mobility (R26.89);Pain;Muscle weakness (generalized) (M62.81)   Activity Tolerance Patient tolerated treatment well   Patient Left in chair;with call bell/phone within reach;with chair alarm set   Nurse Communication          Time: 1342-1400 OT Time Calculation (min): 18 min  Charges: OT General Charges $OT Visit: 1 Procedure OT Treatments $Self Care/Home Management : 8-22 mins     Hortencia Pilar 07/16/2016, 2:28 PM

## 2016-07-16 NOTE — Discharge Summary (Signed)
[facility] Discharge Summary   Patient ID: Teresa Francis MRN: [id number] DOB/AGE: 1941/02/28 76 y.o.  Admit date: 07/11/2016 Discharge date: 07/16/2016  Discharge Diagnosis Patient Active Problem List   Diagnosis Date Noted  . Orthostatic hypotension   . SVT (supraventricular tachycardia) ([place])   . Nondisplaced transverse fracture of left patella, initial encounter for closed fracture 07/12/2016  . Closed nondisplaced fracture of right patella, initial encounter 07/12/2016  . Fall (on) (from) other stairs and steps, initial encounter 07/11/2016  . Syncope 07/11/2016  . B12 deficiency 10/23/2015  . Closed fracture of distal end of right radius 05/30/2015  . Osteopenia 10/25/2013  . Gastroesophageal reflux disease without esophagitis 09/20/2013  . HLD (hyperlipidemia) 09/20/2013  . Essential hypertension 09/20/2013  . Alzheimer's dementia 09/20/2013  . Depression 09/20/2013    Consultants Orthopedic surgery- Dr. Erlinda Hong Neurosurgery- Dr. Cyndy Freeze Plastic surgery - Dr. Marla Roe Cardiology- Dr. Oval Linsey  Family Medicine- Dr. Wendee Beavers Hospitalist - Dr. Loleta Books Imaging: 07/12/26 CT head/maxillofacial/ c-spine -  HEAD CT  1. No acute intracranial abnormalities. 2. No skull fracture. 3. Frontal scalp hematoma. MAXILLOFACIAL CT  1. Mildly depressed comminuted fracture of the right orbital floor. 2. Subtle fracture of the right lateral maxillary sinus wall. Probable nondisplaced fracture of the left lateral maxillary sinus wall. Possible subtle fracture of the lateral left orbital wall. 3. Dependent hemorrhage in the maxillary sinuses and right sphenoid sinus. CERVICAL CT  1. Small nondisplaced fracture along the anterior inferior corner of the C2 vertebra. This appears acute but there is no associated soft tissue swelling or hemorrhage. Fractures new since the prior CT. 2. Apparent fracture along the distal margin of the C3 spinous process, nondisplaced. 3. No  other fractures or acute findings.   Procedures Echocardiogram - 07/15/16  Hospital Course:  76 year-old female with a PMH dementia, HLD, GERD, and HTN who presented to [facility] after she fell down the stairs and struck her face. She reported "blacking out" prior to the fall". She was alert in the ED. At presentation, patient was complaining of bilateral knee pain and neck pain. She also had a right forehead laceration and complained of facial pain. Workup was significant for C2/C3 fracture, Right orbital wall fracture with facial lacerations, bilateral patellar fractures, and 6th rib fracture.  She was admitted for further treatment, observation, therapies, and pain control.  Neurosurgery recommended c-collar and non-operative treatment of c-spine fractures. Orthopedic surgery evaluated patellar fractures and determined that they were non-operative; they recommend a bledshoe brace for the left knee with 0-30 degree ROM and no ROM/weight restrictions on the right knee. Plastic surgery closed facial laceration, evaluated facial fractures, and decided to attempt non-operative management with full liquid diet and strict oral hygiene. The hospitalist and cardiology team were consulted for further evaluation of possible syncopal episode. During her hospitalization patient also had a two transient runs of SVT. Echo was performed and LV EF 55-60% without heart wall abnormality or significant valvular abnormality. Cardiology team recommends discontinuation of beta blocker due to lower limit of normal HR (60 pm) and added amlodipinefor increased control of hypertension. Cardiology is sending patient home on a 30-day event monitor. On 07/16/16 the patients vitals were stable, she was hemodynamically stable, mobilizing with therapies, pain controlled, and was clinically stable for discharge. PT/OT recommended home with home health PT/OT. Home health aide and social work were also ordered, along with any necessary home  equipment. Patient will follow up as below and knows to call with questions concerns.  I have personally reviewed the patients medication history on the [place] controlled substance database.  Allergies as of 07/16/2016      Reactions   Lisinopril Cough      Medication List    TAKE these medications   acetaminophen 325 MG tablet Commonly known as:  TYLENOL Take 2 tablets (650 mg total) by mouth every 6 (six) hours as needed for mild pain.   amLODipine 2.5 MG tablet Commonly known as:  NORVASC Take 1 tablet (2.5 mg total) by mouth daily.   aspirin 81 MG tablet Take 81 mg by mouth daily.   cholecalciferol 1000 units tablet Commonly known as:  VITAMIN D Take 1,000 Units by mouth daily.   donepezil 10 MG tablet Commonly known as:  ARICEPT TAKE 1 TABLET AT BEDTIME   ibandronate 150 MG tablet Commonly known as:  BONIVA Take 1 tablet (150 mg total) by mouth every 30 (thirty) days. Take in the morning with a full glass of water, on an empty stomach, and do not take anything else by mouth or lie down for the next 30 min.   Melatonin 5 MG Tabs Take 1 tablet by mouth at bedtime and may repeat dose one time if needed.   memantine 5 MG tablet Commonly known as:  NAMENDA TAKE 1 TABLET TWICE DAILY   oxyCODONE 5 MG immediate release tablet Commonly known as:  Oxy IR/ROXICODONE Take 1 tablet (5 mg total) by mouth every 6 (six) hours as needed for moderate pain or severe pain.   pantoprazole 40 MG tablet Commonly known as:  PROTONIX Take 1 tablet (40 mg total) by mouth 2 (two) times daily.   sertraline 50 MG tablet Commonly known as:  ZOLOFT TAKE 1 TABLET EVERY DAY   simvastatin 40 MG tablet Commonly known as:  ZOCOR TAKE 1 TABLET DAILY AT 6 PM   vitamin B-12 1000 MCG tablet Commonly known as:  CYANOCOBALAMIN Take 1,000 mcg by mouth daily.   vitamin E 400 UNIT capsule Take 400 Units by mouth daily.            Durable Medical Equipment        Start     Ordered    07/16/16 0000  For home use only DME 3 n 1     07/16/16 0845   07/16/16 0000  For home use only DME Walker rolling    Question:  Patient needs a walker to treat with the following condition  Answer:  Fall   07/16/16 0845   07/16/16 0000  For home use only DME Tub bench     07/16/16 0848   07/15/16 0956  For home use only DME 3 n 1  Once     07/15/16 0955   07/15/16 0954  For home use only DME Walker rolling  Once    Question Answer Comment  Patient needs a walker to treat with the following condition Fall   Patient needs a walker to treat with the following condition Fx patella   Patient needs a walker to treat with the following condition Fx mult cervical vert-closed ([place])      07/15/16 0955       Follow-up Information    CLAIRE S DILLINGHAM, DO Follow up.   Specialty:  Plastic Surgery Why:  1-2 weeks for further instructions regarding facial fractures and diet Contact information: [place] [address] [phone]        Kevan Ny Ditty, MD. Schedule an appointment as soon  as possible for a visit in 4 week(s).   Specialty:  Neurosurgery Why:  for follow up regarding c-collar and c-spine fractures Contact information: [address] [place] [address] [phone]        Eduard Roux, MD. Schedule an appointment as soon as possible for a visit in 2 week(s).   Specialty:  Orthopedic Surgery Why:  for follow up of R knee injury may resume activities as tolerated on your right knee. no movement restrictions.  Contact information: [place] [address] [phone]        [place]. Call.   Why:  as needed. Contact information: [place] [address] [phone]         Dr. Skeet Latch - cardiology ; follow up in 3-4 weeks. [phone]  Signed: Obie Dredge, [facility] 07/16/2016, 12:43 PM Pager:  [phone] Consults: [phone] Mon-Fri 7:00 am-4:30 pm Sat-Sun 7:00 am-11:30 am

## 2016-07-16 NOTE — Care Management Note (Signed)
Case Management Note  Patient Details  Name: Teresa Francis MRN: [id number] Date of Birth: 1940/10/31  Subjective/Objective:   Pt admitted on 07/11/16 s/p fall with bilateral patellar fx, 6th rib fx, Rt orbital wall fx , C2 and C3 fx.  PTA, pt independent, lives at home with daughter.                   Action/Plan: PT/OT evaluations pending.  Will follow for discharge planning as pt progresses.    Expected Discharge Date:  07/16/16               Expected Discharge Plan:  [place]  In-House Referral:     Discharge planning Services  CM Consult  Post Acute Care Choice:  Home Health Choice offered to:  Adult Children, HC POA / Guardian  DME Arranged:  3-N-1, Walker rolling DME Agency:  [place] Arranged:  PT, OT, Nurse's Aide, Social Work [employer] Agency:  [employer]  Status of Service:  Completed, signed off  If discussed at [employer], dates discussed:    Additional Comments:  07/16/16 J. Alen Matheson, RN, BSN  Spoke with daughter to confirm choice for homecare.  Daughter prefers [employer] for [facility] needs.  Will add CSW to [facility] orders, in case pt needs SNF placement from home; daughter agreeable and pleased with this plan.   Referral to [facility] agency; pt accepted for [facility] follow up.  Start of care 24-48h post dc date.  Referral to [facility] for DME needs; daughter states pt will need RW and 3 in 1 as recommended by PT/OT.  She declines tub bench, as they have a walk in shower with a seat.  Daughter states that she and her husband will pick pt up later today after she gets off work.  DME to be delivered to pt's room prior to dc.    Reinaldo Raddle, RN, BSN  Trauma/Neuro ICU Case Manager [phone]

## 2016-07-16 NOTE — Discharge Instructions (Signed)
Full Liquid Diet A full liquid diet may be used:  To help you transition from a clear liquid diet to a soft diet.  When your body is healing and can only tolerate foods that are easy to digest.  Before or after certain a procedure, test, or surgery (such as stomach or intestinal surgeries).  If you have trouble swallowing or chewing. A full liquid diet includes fluids and foods that are liquid or will become liquid at room temperature. The full liquid diet gives you the proteins, fluids, salts, and minerals that you need for energy. If you continue this diet for more than 72 hours, talk to your health care provider about how many calories you need to consume. If you continue the diet for more than 5 days, talk to your health care provider about taking a multivitamin or a nutritional supplement. What do I need to know about a full liquid diet?  You may have any liquid.  You may have any food that becomes a liquid at room temperature. The food is considered a liquid if it can be poured off a spoon at room temperature.  Drink one serving of citrus or vitamin C-enriched fruit juice daily. What foods can I eat? Grains  Any grain food that can be pureed in soup (such as crackers, pasta, and rice). Hot cereal (such as farina or oatmeal) that has been blended. Talk to your health care provider or dietitian about these foods. Vegetables  Pulp-free tomato or vegetable juice. Vegetables pureed in soup. Fruits  Fruit juice, including nectars and juices with pulp. Meats and Other Protein Sources  Eggs in custard, eggnog mix, and eggs used in ice cream or pudding. Strained meats, like in baby food, may be allowed. Consult your health care provider. Dairy  Milk and milk-based beverages, including milk shakes and instant breakfast mixes. Smooth yogurt. Pureed cottage cheese. Avoid these foods if they are not well tolerated. Beverages  All beverages, including liquid nutritional supplements. Ask your  health care provider if you can have carbonated beverages. They may not be well tolerated. Condiments  Iodized salt, pepper, spices, and flavorings. Cocoa powder. Vinegar, ketchup, yellow mustard, smooth sauces (such as hollandaise, cheese sauce, or white sauce), and soy sauce. Sweets and Desserts  Custard, smooth pudding. Flavored gelatin. Tapioca, junket. Plain ice cream, sherbet, fruit ices. Frozen ice pops, frozen fudge pops, pudding pops, and other frozen bars with cream. Syrups, including chocolate syrup. Sugar, honey, jelly. Fats and Oils  Margarine, butter, cream, sour cream, and oils. Other  Broth and cream soups. Strained, broth-based soups. The items listed above may not be a complete list of recommended foods or beverages. Contact your dietitian for more options.  What foods can I not eat? Grains  All breads. Grains are not allowed unless they are pureed into soup. Vegetables  Vegetables are not allowed unless they are juiced, or cooked and pureed into soup. Fruits  Fruits are not allowed unless they are juiced. Meats and Other Protein Sources  Any meat or fish. Cooked or raw eggs. Nut butters. Dairy  Cheese. Condiments  Stone ground mustards. Fats and Oils  Fats that are coarse or chunky. Sweets and Desserts  Ice cream or other frozen desserts that have any solids in them or on top, such as nuts, chocolate chips, and pieces of cookies. Cakes. Cookies. Candy. Others  Soups with chunks or pieces in them. The items listed above may not be a complete list of foods and beverages to avoid.   Contact your dietitian for more information.  This information is not intended to replace advice given to you by your health care provider. Make sure you discuss any questions you have with your health care provider. Document Released: 03/02/2005 Document Revised: 08/08/2015 Document Reviewed: 01/05/2013 Elsevier Interactive Patient Education  2017 [place].   Rib Fracture A rib  fracture is a break or crack in one of the bones of the ribs. The ribs are a group of long, curved bones that wrap around your chest and attach to your spine. They protect your lungs and other organs in the chest cavity. A broken or cracked rib is often painful, but most do not cause other problems. Most rib fractures heal on their own over time. However, rib fractures can be more serious if multiple ribs are broken or if broken ribs move out of place and push against other structures. What are the causes?  A direct blow to the chest. For example, this could happen during contact sports, a car accident, or a fall against a hard object.  Repetitive movements with high force, such as pitching a baseball or having severe coughing spells. What are the signs or symptoms?  Pain when you breathe in or cough.  Pain when someone presses on the injured area. How is this diagnosed? Your caregiver will perform a physical exam. Various imaging tests may be ordered to confirm the diagnosis and to look for related injuries. These tests may include a chest X-ray, computed tomography (CT), magnetic resonance imaging (MRI), or a bone scan. How is this treated? Rib fractures usually heal on their own in 1-3 months. The longer healing period is often associated with a continued cough or other aggravating activities. During the healing period, pain control is very important. Medication is usually given to control pain. Hospitalization or surgery may be needed for more severe injuries, such as those in which multiple ribs are broken or the ribs have moved out of place. Follow these instructions at home:  Avoid strenuous activity and any activities or movements that cause pain. Be careful during activities and avoid bumping the injured rib.  Gradually increase activity as directed by your caregiver.  Only take over-the-counter or prescription medications as directed by your caregiver. Do not take other medications  without asking your caregiver first.  Apply ice to the injured area for the first 1-2 days after you have been treated or as directed by your caregiver. Applying ice helps to reduce inflammation and pain.  Put ice in a plastic bag.  Place a towel between your skin and the bag.  Leave the ice on for 15-20 minutes at a time, every 2 hours while you are awake.  Perform deep breathing as directed by your caregiver. This will help prevent pneumonia, which is a common complication of a broken rib. Your caregiver may instruct you to:  Take deep breaths several times a day.  Try to cough several times a day, holding a pillow against the injured area.  Use a device called an incentive spirometer to practice deep breathing several times a day.  Drink enough fluids to keep your urine clear or pale yellow. This will help you avoid constipation.  Do not wear a rib belt or binder. These restrict breathing, which can lead to pneumonia. Get help right away if:  You have a fever.  You have difficulty breathing or shortness of breath.  You develop a continual cough, or you cough up thick or bloody sputum.  You feel sick to your stomach (nausea), throw up (vomit), or have abdominal pain.  You have worsening pain not controlled with medications. This information is not intended to replace advice given to you by your health care provider. Make sure you discuss any questions you have with your health care provider. Document Released: 03/02/2005 Document Revised: 08/08/2015 Document Reviewed: 05/04/2012 Elsevier Interactive Patient Education  2017 [employer].

## 2016-07-16 NOTE — Progress Notes (Signed)
Progress Note  Patient Name: Teresa Francis Date of Encounter: 07/16/2016  Primary Cardiologist: Teresa Calkins Oval Linsey)  Subjective   Feeling well.  Denies chest pain, shortness of breath, lightheadedness or palpitations.   Inpatient Medications    Scheduled Meds: . cholecalciferol  1,000 Units Oral Daily  . docusate sodium  100 mg Oral BID  . donepezil  10 mg Oral QHS  . enoxaparin (LOVENOX) injection  40 mg Subcutaneous Q24H  . Melatonin  1 tablet Oral QHS,MR X 1  . memantine  5 mg Oral BID  . pantoprazole  40 mg Oral BID  . sertraline  50 mg Oral Daily  . simvastatin  40 mg Oral q1800  . vitamin B-12  1,000 mcg Oral Daily   Continuous Infusions: . dextrose 5 % and 0.9% NaCl Stopped (07/14/16 1100)   PRN Meds: acetaminophen, HYDROmorphone (DILAUDID) injection, labetalol, methocarbamol, ondansetron **OR** ondansetron (ZOFRAN) IV, oxyCODONE   Vital Signs    Vitals:   07/15/16 0458 07/15/16 1508 07/15/16 2142 07/16/16 0420  BP: (!) 151/59 (!) 160/72 (!) 153/60 (!) 146/69  Pulse: 66 68 70 72  Resp: 17 16 18 16   Temp: 98.3 F (36.8 C) 98.8 F (37.1 C) 99.2 F (37.3 C) 98.6 F (37 C)  TempSrc: Oral  Oral Oral  SpO2: 94%  94% 97%  Weight:      Height:        Intake/Output Summary (Last 24 hours) at 07/16/16 1124 Last data filed at 07/15/16 2030  Gross per 24 hour  Intake              236 ml  Output                0 ml  Net              236 ml   Filed Weights   07/11/16 1949 07/12/16 0035  Weight: 81.6 kg (180 lb) 85.3 kg (188 lb 0.8 oz)    Telemetry    No events - Personally Reviewed  ECG    n/a  Physical Exam   GEN: Elderly woman sitting in acute distress.  Multiple ecchymoses Neck: Unable to assess JVD 2/2 neck collar. Cardiac: RRR, no murmurs, rubs, or gallops.  Respiratory: Clear to auscultation bilaterally. GI: Soft, nontender, non-distended  MS: No edema; Brace on L leg. Neuro:  Nonfocal  Psych: Normal affect   Labs    Chemistry Recent  Labs Lab 07/11/16 2127 07/12/16 0559 07/13/16 0316  NA 138 140 140  K 3.8 3.9 3.9  CL 101 106 106  CO2 26 25 28   GLUCOSE 135* 168* 118*  BUN 17 17 12   CREATININE 1.03* 0.93 0.90  CALCIUM 9.2 9.0 8.7*  GFRNONAA 51* 58* >60  GFRAA 59* >60 >60  ANIONGAP 11 9 6      Hematology Recent Labs Lab 07/11/16 2127 07/12/16 0559 07/13/16 0316  WBC 15.1* 11.7* 10.0  RBC 4.90 4.43 4.29  HGB 13.3 11.9* 11.5*  HCT 40.8 36.5 36.0  MCV 83.3 82.4 83.9  MCH 27.1 26.9 26.8  MCHC 32.6 32.6 31.9  RDW 15.3 15.3 16.0*  PLT 324 299 280    Cardiac EnzymesNo results for input(s): TROPONINI in the last 168 hours.  Recent Labs Lab 07/11/16 2141  TROPIPOC 0.00     BNPNo results for input(s): BNP, PROBNP in the last 168 hours.   DDimer No results for input(s): DDIMER in the last 168 hours.   Radiology    No results  found.  Cardiac Studies   Echo 07/15/16: Study Conclusions  - Left ventricle: The cavity size was normal. Systolic function was   normal. The estimated ejection fraction was in the range of 55%   to 60%. Wall motion was normal; there were no regional wall   motion abnormalities. Left ventricular diastolic function   parameters were normal. - Aortic valve: There was trivial regurgitation. - Atrial septum: No defect or patent foramen ovale was identified.   Patient Profile     Teresa Francis is a 76 y.o. female with hx of HTN, HLD, depression and dementia presents fall and found to multiple fractures.   Assessment & Plan    # Syncope/Fall: # SVT: Teresa Francis had an episode of syncope/fall.  It seems that it may have been more of a mechanical fall than syncope, but the history is unclear.  She did have two episodes of SVT while in the hospital, but was asymptomatic.  Thyroid function and electrolytes are normal.  Ideally we would give a beta blocker.  However, her baseline heart rate is in the low 60s.  Adding a beta blocker is likely to make her feel worse and put her at risk  for syncope.  We are setting up an outpatient 30 day event monitor.    Orthostatic VS 07/14/16 Lying 162/72 HR 80, Sitting 151/74 HR 82, Standing 0 min 148/76, HR 94, Standing 3 min 173/78, HR 91  # Hypertension:  BP has been elevated as high as the 160s.  We will start amlodipine 2.5 mg daily.     Signed, Skeet Latch, MD  07/16/2016, 11:24 AM

## 2016-07-17 DIAGNOSIS — S0281XD Fracture of other specified skull and facial bones, right side, subsequent encounter for fracture with routine healing: Secondary | ICD-10-CM | POA: Diagnosis not present

## 2016-07-17 DIAGNOSIS — S2232XD Fracture of one rib, left side, subsequent encounter for fracture with routine healing: Secondary | ICD-10-CM | POA: Diagnosis not present

## 2016-07-17 DIAGNOSIS — I471 Supraventricular tachycardia: Secondary | ICD-10-CM | POA: Diagnosis not present

## 2016-07-17 DIAGNOSIS — S82001D Unspecified fracture of right patella, subsequent encounter for closed fracture with routine healing: Secondary | ICD-10-CM | POA: Diagnosis not present

## 2016-07-17 DIAGNOSIS — S12200D Unspecified displaced fracture of third cervical vertebra, subsequent encounter for fracture with routine healing: Secondary | ICD-10-CM | POA: Diagnosis not present

## 2016-07-17 DIAGNOSIS — F0280 Dementia in other diseases classified elsewhere without behavioral disturbance: Secondary | ICD-10-CM | POA: Diagnosis not present

## 2016-07-17 DIAGNOSIS — S12100D Unspecified displaced fracture of second cervical vertebra, subsequent encounter for fracture with routine healing: Secondary | ICD-10-CM | POA: Diagnosis not present

## 2016-07-17 DIAGNOSIS — G309 Alzheimer's disease, unspecified: Secondary | ICD-10-CM | POA: Diagnosis not present

## 2016-07-17 DIAGNOSIS — S82035D Nondisplaced transverse fracture of left patella, subsequent encounter for closed fracture with routine healing: Secondary | ICD-10-CM | POA: Diagnosis not present

## 2016-07-20 ENCOUNTER — Ambulatory Visit (INDEPENDENT_AMBULATORY_CARE_PROVIDER_SITE_OTHER): Payer: Medicare HMO

## 2016-07-20 DIAGNOSIS — I471 Supraventricular tachycardia: Secondary | ICD-10-CM

## 2016-07-21 DIAGNOSIS — G309 Alzheimer's disease, unspecified: Secondary | ICD-10-CM | POA: Diagnosis not present

## 2016-07-21 DIAGNOSIS — S82035D Nondisplaced transverse fracture of left patella, subsequent encounter for closed fracture with routine healing: Secondary | ICD-10-CM | POA: Diagnosis not present

## 2016-07-21 DIAGNOSIS — S0231XA Fracture of orbital floor, right side, initial encounter for closed fracture: Secondary | ICD-10-CM | POA: Diagnosis not present

## 2016-07-21 DIAGNOSIS — S2232XD Fracture of one rib, left side, subsequent encounter for fracture with routine healing: Secondary | ICD-10-CM | POA: Diagnosis not present

## 2016-07-21 DIAGNOSIS — S82001D Unspecified fracture of right patella, subsequent encounter for closed fracture with routine healing: Secondary | ICD-10-CM | POA: Diagnosis not present

## 2016-07-21 DIAGNOSIS — S0281XD Fracture of other specified skull and facial bones, right side, subsequent encounter for fracture with routine healing: Secondary | ICD-10-CM | POA: Diagnosis not present

## 2016-07-21 DIAGNOSIS — S12100D Unspecified displaced fracture of second cervical vertebra, subsequent encounter for fracture with routine healing: Secondary | ICD-10-CM | POA: Diagnosis not present

## 2016-07-21 DIAGNOSIS — I471 Supraventricular tachycardia: Secondary | ICD-10-CM | POA: Diagnosis not present

## 2016-07-21 DIAGNOSIS — S12200D Unspecified displaced fracture of third cervical vertebra, subsequent encounter for fracture with routine healing: Secondary | ICD-10-CM | POA: Diagnosis not present

## 2016-07-21 DIAGNOSIS — F0280 Dementia in other diseases classified elsewhere without behavioral disturbance: Secondary | ICD-10-CM | POA: Diagnosis not present

## 2016-07-22 DIAGNOSIS — G309 Alzheimer's disease, unspecified: Secondary | ICD-10-CM | POA: Diagnosis not present

## 2016-07-22 DIAGNOSIS — S12100D Unspecified displaced fracture of second cervical vertebra, subsequent encounter for fracture with routine healing: Secondary | ICD-10-CM | POA: Diagnosis not present

## 2016-07-22 DIAGNOSIS — I471 Supraventricular tachycardia: Secondary | ICD-10-CM | POA: Diagnosis not present

## 2016-07-22 DIAGNOSIS — S0281XD Fracture of other specified skull and facial bones, right side, subsequent encounter for fracture with routine healing: Secondary | ICD-10-CM | POA: Diagnosis not present

## 2016-07-22 DIAGNOSIS — S2232XD Fracture of one rib, left side, subsequent encounter for fracture with routine healing: Secondary | ICD-10-CM | POA: Diagnosis not present

## 2016-07-22 DIAGNOSIS — S82035D Nondisplaced transverse fracture of left patella, subsequent encounter for closed fracture with routine healing: Secondary | ICD-10-CM | POA: Diagnosis not present

## 2016-07-22 DIAGNOSIS — F0280 Dementia in other diseases classified elsewhere without behavioral disturbance: Secondary | ICD-10-CM | POA: Diagnosis not present

## 2016-07-22 DIAGNOSIS — S12200D Unspecified displaced fracture of third cervical vertebra, subsequent encounter for fracture with routine healing: Secondary | ICD-10-CM | POA: Diagnosis not present

## 2016-07-22 DIAGNOSIS — S82001D Unspecified fracture of right patella, subsequent encounter for closed fracture with routine healing: Secondary | ICD-10-CM | POA: Diagnosis not present

## 2016-07-23 DIAGNOSIS — S82035D Nondisplaced transverse fracture of left patella, subsequent encounter for closed fracture with routine healing: Secondary | ICD-10-CM | POA: Diagnosis not present

## 2016-07-23 DIAGNOSIS — S2232XD Fracture of one rib, left side, subsequent encounter for fracture with routine healing: Secondary | ICD-10-CM | POA: Diagnosis not present

## 2016-07-23 DIAGNOSIS — S82001D Unspecified fracture of right patella, subsequent encounter for closed fracture with routine healing: Secondary | ICD-10-CM | POA: Diagnosis not present

## 2016-07-23 DIAGNOSIS — S12100D Unspecified displaced fracture of second cervical vertebra, subsequent encounter for fracture with routine healing: Secondary | ICD-10-CM | POA: Diagnosis not present

## 2016-07-23 DIAGNOSIS — S0281XD Fracture of other specified skull and facial bones, right side, subsequent encounter for fracture with routine healing: Secondary | ICD-10-CM | POA: Diagnosis not present

## 2016-07-23 DIAGNOSIS — G309 Alzheimer's disease, unspecified: Secondary | ICD-10-CM | POA: Diagnosis not present

## 2016-07-23 DIAGNOSIS — S12200D Unspecified displaced fracture of third cervical vertebra, subsequent encounter for fracture with routine healing: Secondary | ICD-10-CM | POA: Diagnosis not present

## 2016-07-23 DIAGNOSIS — I471 Supraventricular tachycardia: Secondary | ICD-10-CM | POA: Diagnosis not present

## 2016-07-23 DIAGNOSIS — F0280 Dementia in other diseases classified elsewhere without behavioral disturbance: Secondary | ICD-10-CM | POA: Diagnosis not present

## 2016-07-24 DIAGNOSIS — I471 Supraventricular tachycardia: Secondary | ICD-10-CM | POA: Diagnosis not present

## 2016-07-24 DIAGNOSIS — S2232XD Fracture of one rib, left side, subsequent encounter for fracture with routine healing: Secondary | ICD-10-CM | POA: Diagnosis not present

## 2016-07-24 DIAGNOSIS — S0281XD Fracture of other specified skull and facial bones, right side, subsequent encounter for fracture with routine healing: Secondary | ICD-10-CM | POA: Diagnosis not present

## 2016-07-24 DIAGNOSIS — G309 Alzheimer's disease, unspecified: Secondary | ICD-10-CM | POA: Diagnosis not present

## 2016-07-24 DIAGNOSIS — S12100D Unspecified displaced fracture of second cervical vertebra, subsequent encounter for fracture with routine healing: Secondary | ICD-10-CM | POA: Diagnosis not present

## 2016-07-24 DIAGNOSIS — S82001D Unspecified fracture of right patella, subsequent encounter for closed fracture with routine healing: Secondary | ICD-10-CM | POA: Diagnosis not present

## 2016-07-24 DIAGNOSIS — F0280 Dementia in other diseases classified elsewhere without behavioral disturbance: Secondary | ICD-10-CM | POA: Diagnosis not present

## 2016-07-24 DIAGNOSIS — S82035D Nondisplaced transverse fracture of left patella, subsequent encounter for closed fracture with routine healing: Secondary | ICD-10-CM | POA: Diagnosis not present

## 2016-07-24 DIAGNOSIS — S12200D Unspecified displaced fracture of third cervical vertebra, subsequent encounter for fracture with routine healing: Secondary | ICD-10-CM | POA: Diagnosis not present

## 2016-07-28 DIAGNOSIS — S0281XD Fracture of other specified skull and facial bones, right side, subsequent encounter for fracture with routine healing: Secondary | ICD-10-CM | POA: Diagnosis not present

## 2016-07-28 DIAGNOSIS — S12200D Unspecified displaced fracture of third cervical vertebra, subsequent encounter for fracture with routine healing: Secondary | ICD-10-CM | POA: Diagnosis not present

## 2016-07-28 DIAGNOSIS — S82035D Nondisplaced transverse fracture of left patella, subsequent encounter for closed fracture with routine healing: Secondary | ICD-10-CM | POA: Diagnosis not present

## 2016-07-28 DIAGNOSIS — S82001D Unspecified fracture of right patella, subsequent encounter for closed fracture with routine healing: Secondary | ICD-10-CM | POA: Diagnosis not present

## 2016-07-28 DIAGNOSIS — S12100D Unspecified displaced fracture of second cervical vertebra, subsequent encounter for fracture with routine healing: Secondary | ICD-10-CM | POA: Diagnosis not present

## 2016-07-28 DIAGNOSIS — F0280 Dementia in other diseases classified elsewhere without behavioral disturbance: Secondary | ICD-10-CM | POA: Diagnosis not present

## 2016-07-28 DIAGNOSIS — I471 Supraventricular tachycardia: Secondary | ICD-10-CM | POA: Diagnosis not present

## 2016-07-28 DIAGNOSIS — S2232XD Fracture of one rib, left side, subsequent encounter for fracture with routine healing: Secondary | ICD-10-CM | POA: Diagnosis not present

## 2016-07-28 DIAGNOSIS — G309 Alzheimer's disease, unspecified: Secondary | ICD-10-CM | POA: Diagnosis not present

## 2016-07-29 ENCOUNTER — Telehealth: Payer: Self-pay

## 2016-07-29 NOTE — Telephone Encounter (Signed)
Ok to increase [facility] aide as requested

## 2016-07-29 NOTE — Telephone Encounter (Signed)
Teresa Francis clinical mgr with [facility] left v/m request verbal orders to increase HH aide to 3 x a week for 5 weeks.

## 2016-07-29 NOTE — Telephone Encounter (Signed)
Spoke to [place], verbal order given

## 2016-07-30 ENCOUNTER — Encounter: Payer: Self-pay | Admitting: Internal Medicine

## 2016-07-30 DIAGNOSIS — G309 Alzheimer's disease, unspecified: Secondary | ICD-10-CM | POA: Diagnosis not present

## 2016-07-30 DIAGNOSIS — F0280 Dementia in other diseases classified elsewhere without behavioral disturbance: Secondary | ICD-10-CM | POA: Diagnosis not present

## 2016-07-30 DIAGNOSIS — S2232XD Fracture of one rib, left side, subsequent encounter for fracture with routine healing: Secondary | ICD-10-CM | POA: Diagnosis not present

## 2016-07-30 DIAGNOSIS — S12200D Unspecified displaced fracture of third cervical vertebra, subsequent encounter for fracture with routine healing: Secondary | ICD-10-CM | POA: Diagnosis not present

## 2016-07-30 DIAGNOSIS — S82001D Unspecified fracture of right patella, subsequent encounter for closed fracture with routine healing: Secondary | ICD-10-CM | POA: Diagnosis not present

## 2016-07-30 DIAGNOSIS — S0281XD Fracture of other specified skull and facial bones, right side, subsequent encounter for fracture with routine healing: Secondary | ICD-10-CM | POA: Diagnosis not present

## 2016-07-30 DIAGNOSIS — I471 Supraventricular tachycardia: Secondary | ICD-10-CM | POA: Diagnosis not present

## 2016-07-30 DIAGNOSIS — S82035D Nondisplaced transverse fracture of left patella, subsequent encounter for closed fracture with routine healing: Secondary | ICD-10-CM | POA: Diagnosis not present

## 2016-07-30 DIAGNOSIS — S12100D Unspecified displaced fracture of second cervical vertebra, subsequent encounter for fracture with routine healing: Secondary | ICD-10-CM | POA: Diagnosis not present

## 2016-07-31 ENCOUNTER — Other Ambulatory Visit: Payer: Self-pay | Admitting: Internal Medicine

## 2016-07-31 DIAGNOSIS — I471 Supraventricular tachycardia: Secondary | ICD-10-CM | POA: Diagnosis not present

## 2016-07-31 DIAGNOSIS — G309 Alzheimer's disease, unspecified: Secondary | ICD-10-CM | POA: Diagnosis not present

## 2016-07-31 DIAGNOSIS — S82001D Unspecified fracture of right patella, subsequent encounter for closed fracture with routine healing: Secondary | ICD-10-CM | POA: Diagnosis not present

## 2016-07-31 DIAGNOSIS — S2232XD Fracture of one rib, left side, subsequent encounter for fracture with routine healing: Secondary | ICD-10-CM | POA: Diagnosis not present

## 2016-07-31 DIAGNOSIS — S12100D Unspecified displaced fracture of second cervical vertebra, subsequent encounter for fracture with routine healing: Secondary | ICD-10-CM | POA: Diagnosis not present

## 2016-07-31 DIAGNOSIS — S82035D Nondisplaced transverse fracture of left patella, subsequent encounter for closed fracture with routine healing: Secondary | ICD-10-CM | POA: Diagnosis not present

## 2016-07-31 DIAGNOSIS — S12200D Unspecified displaced fracture of third cervical vertebra, subsequent encounter for fracture with routine healing: Secondary | ICD-10-CM | POA: Diagnosis not present

## 2016-07-31 DIAGNOSIS — F0280 Dementia in other diseases classified elsewhere without behavioral disturbance: Secondary | ICD-10-CM | POA: Diagnosis not present

## 2016-07-31 DIAGNOSIS — S0281XD Fracture of other specified skull and facial bones, right side, subsequent encounter for fracture with routine healing: Secondary | ICD-10-CM | POA: Diagnosis not present

## 2016-08-03 ENCOUNTER — Other Ambulatory Visit: Payer: Self-pay | Admitting: Internal Medicine

## 2016-08-03 ENCOUNTER — Inpatient Hospital Stay (INDEPENDENT_AMBULATORY_CARE_PROVIDER_SITE_OTHER): Payer: Commercial Managed Care - HMO | Admitting: Orthopaedic Surgery

## 2016-08-04 ENCOUNTER — Ambulatory Visit (INDEPENDENT_AMBULATORY_CARE_PROVIDER_SITE_OTHER): Payer: Medicare HMO | Admitting: Orthopaedic Surgery

## 2016-08-04 ENCOUNTER — Ambulatory Visit (INDEPENDENT_AMBULATORY_CARE_PROVIDER_SITE_OTHER): Payer: Medicare HMO

## 2016-08-04 DIAGNOSIS — S12100D Unspecified displaced fracture of second cervical vertebra, subsequent encounter for fracture with routine healing: Secondary | ICD-10-CM | POA: Diagnosis not present

## 2016-08-04 DIAGNOSIS — M25562 Pain in left knee: Secondary | ICD-10-CM

## 2016-08-04 DIAGNOSIS — G309 Alzheimer's disease, unspecified: Secondary | ICD-10-CM | POA: Diagnosis not present

## 2016-08-04 DIAGNOSIS — S82001D Unspecified fracture of right patella, subsequent encounter for closed fracture with routine healing: Secondary | ICD-10-CM | POA: Diagnosis not present

## 2016-08-04 DIAGNOSIS — S82035D Nondisplaced transverse fracture of left patella, subsequent encounter for closed fracture with routine healing: Secondary | ICD-10-CM | POA: Diagnosis not present

## 2016-08-04 DIAGNOSIS — S0281XD Fracture of other specified skull and facial bones, right side, subsequent encounter for fracture with routine healing: Secondary | ICD-10-CM | POA: Diagnosis not present

## 2016-08-04 DIAGNOSIS — I471 Supraventricular tachycardia: Secondary | ICD-10-CM | POA: Diagnosis not present

## 2016-08-04 DIAGNOSIS — S12200D Unspecified displaced fracture of third cervical vertebra, subsequent encounter for fracture with routine healing: Secondary | ICD-10-CM | POA: Diagnosis not present

## 2016-08-04 DIAGNOSIS — S82035A Nondisplaced transverse fracture of left patella, initial encounter for closed fracture: Secondary | ICD-10-CM

## 2016-08-04 DIAGNOSIS — S2232XD Fracture of one rib, left side, subsequent encounter for fracture with routine healing: Secondary | ICD-10-CM | POA: Diagnosis not present

## 2016-08-04 DIAGNOSIS — F0280 Dementia in other diseases classified elsewhere without behavioral disturbance: Secondary | ICD-10-CM | POA: Diagnosis not present

## 2016-08-04 MED ORDER — AMLODIPINE BESYLATE 2.5 MG PO TABS
2.5000 mg | ORAL_TABLET | Freq: Every day | ORAL | 0 refills | Status: DC
Start: 2016-08-04 — End: 2016-08-18

## 2016-08-04 NOTE — Progress Notes (Signed)
Patient follows up today for her nondisplaced patellar fracture. She does do well. She does have Alzheimer's. Left knee exam shows no pain with palpation. Minimal swelling. Patella is nontender. X-ray show stable fracture alignment with bridging callus formation. The Bledsoe brace was increased to 0-90. She may discontinue the brace in 4 weeks. Continue with physical therapy. Follow-up in 6 weeks with lateral left knee x-ray only.

## 2016-08-05 DIAGNOSIS — S2232XD Fracture of one rib, left side, subsequent encounter for fracture with routine healing: Secondary | ICD-10-CM | POA: Diagnosis not present

## 2016-08-05 DIAGNOSIS — S12200D Unspecified displaced fracture of third cervical vertebra, subsequent encounter for fracture with routine healing: Secondary | ICD-10-CM | POA: Diagnosis not present

## 2016-08-05 DIAGNOSIS — S82001D Unspecified fracture of right patella, subsequent encounter for closed fracture with routine healing: Secondary | ICD-10-CM | POA: Diagnosis not present

## 2016-08-05 DIAGNOSIS — I471 Supraventricular tachycardia: Secondary | ICD-10-CM | POA: Diagnosis not present

## 2016-08-05 DIAGNOSIS — F0280 Dementia in other diseases classified elsewhere without behavioral disturbance: Secondary | ICD-10-CM | POA: Diagnosis not present

## 2016-08-05 DIAGNOSIS — S12100D Unspecified displaced fracture of second cervical vertebra, subsequent encounter for fracture with routine healing: Secondary | ICD-10-CM | POA: Diagnosis not present

## 2016-08-05 DIAGNOSIS — S82035D Nondisplaced transverse fracture of left patella, subsequent encounter for closed fracture with routine healing: Secondary | ICD-10-CM | POA: Diagnosis not present

## 2016-08-05 DIAGNOSIS — G309 Alzheimer's disease, unspecified: Secondary | ICD-10-CM | POA: Diagnosis not present

## 2016-08-05 DIAGNOSIS — S0281XD Fracture of other specified skull and facial bones, right side, subsequent encounter for fracture with routine healing: Secondary | ICD-10-CM | POA: Diagnosis not present

## 2016-08-06 DIAGNOSIS — G309 Alzheimer's disease, unspecified: Secondary | ICD-10-CM | POA: Diagnosis not present

## 2016-08-06 DIAGNOSIS — S12100D Unspecified displaced fracture of second cervical vertebra, subsequent encounter for fracture with routine healing: Secondary | ICD-10-CM | POA: Diagnosis not present

## 2016-08-06 DIAGNOSIS — S82001D Unspecified fracture of right patella, subsequent encounter for closed fracture with routine healing: Secondary | ICD-10-CM | POA: Diagnosis not present

## 2016-08-06 DIAGNOSIS — F0280 Dementia in other diseases classified elsewhere without behavioral disturbance: Secondary | ICD-10-CM | POA: Diagnosis not present

## 2016-08-06 DIAGNOSIS — S12200D Unspecified displaced fracture of third cervical vertebra, subsequent encounter for fracture with routine healing: Secondary | ICD-10-CM | POA: Diagnosis not present

## 2016-08-06 DIAGNOSIS — I471 Supraventricular tachycardia: Secondary | ICD-10-CM | POA: Diagnosis not present

## 2016-08-06 DIAGNOSIS — S82035D Nondisplaced transverse fracture of left patella, subsequent encounter for closed fracture with routine healing: Secondary | ICD-10-CM | POA: Diagnosis not present

## 2016-08-06 DIAGNOSIS — S0281XD Fracture of other specified skull and facial bones, right side, subsequent encounter for fracture with routine healing: Secondary | ICD-10-CM | POA: Diagnosis not present

## 2016-08-06 DIAGNOSIS — S2232XD Fracture of one rib, left side, subsequent encounter for fracture with routine healing: Secondary | ICD-10-CM | POA: Diagnosis not present

## 2016-08-11 DIAGNOSIS — S82001D Unspecified fracture of right patella, subsequent encounter for closed fracture with routine healing: Secondary | ICD-10-CM | POA: Diagnosis not present

## 2016-08-11 DIAGNOSIS — I471 Supraventricular tachycardia: Secondary | ICD-10-CM | POA: Diagnosis not present

## 2016-08-11 DIAGNOSIS — F0280 Dementia in other diseases classified elsewhere without behavioral disturbance: Secondary | ICD-10-CM | POA: Diagnosis not present

## 2016-08-11 DIAGNOSIS — S82035D Nondisplaced transverse fracture of left patella, subsequent encounter for closed fracture with routine healing: Secondary | ICD-10-CM | POA: Diagnosis not present

## 2016-08-11 DIAGNOSIS — S12200D Unspecified displaced fracture of third cervical vertebra, subsequent encounter for fracture with routine healing: Secondary | ICD-10-CM | POA: Diagnosis not present

## 2016-08-11 DIAGNOSIS — S2232XD Fracture of one rib, left side, subsequent encounter for fracture with routine healing: Secondary | ICD-10-CM | POA: Diagnosis not present

## 2016-08-11 DIAGNOSIS — S12100D Unspecified displaced fracture of second cervical vertebra, subsequent encounter for fracture with routine healing: Secondary | ICD-10-CM | POA: Diagnosis not present

## 2016-08-11 DIAGNOSIS — S0281XD Fracture of other specified skull and facial bones, right side, subsequent encounter for fracture with routine healing: Secondary | ICD-10-CM | POA: Diagnosis not present

## 2016-08-11 DIAGNOSIS — G309 Alzheimer's disease, unspecified: Secondary | ICD-10-CM | POA: Diagnosis not present

## 2016-08-12 DIAGNOSIS — S12100D Unspecified displaced fracture of second cervical vertebra, subsequent encounter for fracture with routine healing: Secondary | ICD-10-CM | POA: Diagnosis not present

## 2016-08-12 DIAGNOSIS — S82001D Unspecified fracture of right patella, subsequent encounter for closed fracture with routine healing: Secondary | ICD-10-CM | POA: Diagnosis not present

## 2016-08-12 DIAGNOSIS — G309 Alzheimer's disease, unspecified: Secondary | ICD-10-CM | POA: Diagnosis not present

## 2016-08-12 DIAGNOSIS — S82035D Nondisplaced transverse fracture of left patella, subsequent encounter for closed fracture with routine healing: Secondary | ICD-10-CM | POA: Diagnosis not present

## 2016-08-12 DIAGNOSIS — S0281XD Fracture of other specified skull and facial bones, right side, subsequent encounter for fracture with routine healing: Secondary | ICD-10-CM | POA: Diagnosis not present

## 2016-08-12 DIAGNOSIS — S129XXD Fracture of neck, unspecified, subsequent encounter: Secondary | ICD-10-CM | POA: Diagnosis not present

## 2016-08-12 DIAGNOSIS — S12200D Unspecified displaced fracture of third cervical vertebra, subsequent encounter for fracture with routine healing: Secondary | ICD-10-CM | POA: Diagnosis not present

## 2016-08-12 DIAGNOSIS — F0280 Dementia in other diseases classified elsewhere without behavioral disturbance: Secondary | ICD-10-CM | POA: Diagnosis not present

## 2016-08-12 DIAGNOSIS — I471 Supraventricular tachycardia: Secondary | ICD-10-CM | POA: Diagnosis not present

## 2016-08-12 DIAGNOSIS — S2232XD Fracture of one rib, left side, subsequent encounter for fracture with routine healing: Secondary | ICD-10-CM | POA: Diagnosis not present

## 2016-08-13 DIAGNOSIS — S12100D Unspecified displaced fracture of second cervical vertebra, subsequent encounter for fracture with routine healing: Secondary | ICD-10-CM | POA: Diagnosis not present

## 2016-08-13 DIAGNOSIS — S82035D Nondisplaced transverse fracture of left patella, subsequent encounter for closed fracture with routine healing: Secondary | ICD-10-CM | POA: Diagnosis not present

## 2016-08-13 DIAGNOSIS — I471 Supraventricular tachycardia: Secondary | ICD-10-CM | POA: Diagnosis not present

## 2016-08-13 DIAGNOSIS — G309 Alzheimer's disease, unspecified: Secondary | ICD-10-CM | POA: Diagnosis not present

## 2016-08-13 DIAGNOSIS — S82001D Unspecified fracture of right patella, subsequent encounter for closed fracture with routine healing: Secondary | ICD-10-CM | POA: Diagnosis not present

## 2016-08-13 DIAGNOSIS — S0281XD Fracture of other specified skull and facial bones, right side, subsequent encounter for fracture with routine healing: Secondary | ICD-10-CM | POA: Diagnosis not present

## 2016-08-13 DIAGNOSIS — S12200D Unspecified displaced fracture of third cervical vertebra, subsequent encounter for fracture with routine healing: Secondary | ICD-10-CM | POA: Diagnosis not present

## 2016-08-13 DIAGNOSIS — S2232XD Fracture of one rib, left side, subsequent encounter for fracture with routine healing: Secondary | ICD-10-CM | POA: Diagnosis not present

## 2016-08-13 DIAGNOSIS — F0280 Dementia in other diseases classified elsewhere without behavioral disturbance: Secondary | ICD-10-CM | POA: Diagnosis not present

## 2016-08-14 DIAGNOSIS — S0281XD Fracture of other specified skull and facial bones, right side, subsequent encounter for fracture with routine healing: Secondary | ICD-10-CM | POA: Diagnosis not present

## 2016-08-14 DIAGNOSIS — S82035D Nondisplaced transverse fracture of left patella, subsequent encounter for closed fracture with routine healing: Secondary | ICD-10-CM | POA: Diagnosis not present

## 2016-08-14 DIAGNOSIS — S2232XD Fracture of one rib, left side, subsequent encounter for fracture with routine healing: Secondary | ICD-10-CM | POA: Diagnosis not present

## 2016-08-14 DIAGNOSIS — S12100D Unspecified displaced fracture of second cervical vertebra, subsequent encounter for fracture with routine healing: Secondary | ICD-10-CM | POA: Diagnosis not present

## 2016-08-14 DIAGNOSIS — S82001D Unspecified fracture of right patella, subsequent encounter for closed fracture with routine healing: Secondary | ICD-10-CM | POA: Diagnosis not present

## 2016-08-14 DIAGNOSIS — S12200D Unspecified displaced fracture of third cervical vertebra, subsequent encounter for fracture with routine healing: Secondary | ICD-10-CM | POA: Diagnosis not present

## 2016-08-14 DIAGNOSIS — G309 Alzheimer's disease, unspecified: Secondary | ICD-10-CM | POA: Diagnosis not present

## 2016-08-14 DIAGNOSIS — I471 Supraventricular tachycardia: Secondary | ICD-10-CM | POA: Diagnosis not present

## 2016-08-14 DIAGNOSIS — F0280 Dementia in other diseases classified elsewhere without behavioral disturbance: Secondary | ICD-10-CM | POA: Diagnosis not present

## 2016-08-18 ENCOUNTER — Ambulatory Visit (INDEPENDENT_AMBULATORY_CARE_PROVIDER_SITE_OTHER): Payer: Medicare HMO | Admitting: Internal Medicine

## 2016-08-18 ENCOUNTER — Encounter: Payer: Self-pay | Admitting: Internal Medicine

## 2016-08-18 VITALS — BP 144/82 | HR 67 | Temp 98.4°F | Wt 176.0 lb

## 2016-08-18 DIAGNOSIS — S12201D Unspecified nondisplaced fracture of third cervical vertebra, subsequent encounter for fracture with routine healing: Secondary | ICD-10-CM

## 2016-08-18 DIAGNOSIS — S82009D Unspecified fracture of unspecified patella, subsequent encounter for closed fracture with routine healing: Secondary | ICD-10-CM | POA: Diagnosis not present

## 2016-08-18 DIAGNOSIS — S82001D Unspecified fracture of right patella, subsequent encounter for closed fracture with routine healing: Secondary | ICD-10-CM | POA: Diagnosis not present

## 2016-08-18 DIAGNOSIS — S02401D Maxillary fracture, unspecified, subsequent encounter for fracture with routine healing: Secondary | ICD-10-CM | POA: Diagnosis not present

## 2016-08-18 DIAGNOSIS — S2232XD Fracture of one rib, left side, subsequent encounter for fracture with routine healing: Secondary | ICD-10-CM | POA: Diagnosis not present

## 2016-08-18 DIAGNOSIS — S82035D Nondisplaced transverse fracture of left patella, subsequent encounter for closed fracture with routine healing: Secondary | ICD-10-CM | POA: Diagnosis not present

## 2016-08-18 DIAGNOSIS — I471 Supraventricular tachycardia: Secondary | ICD-10-CM | POA: Diagnosis not present

## 2016-08-18 DIAGNOSIS — S12200D Unspecified displaced fracture of third cervical vertebra, subsequent encounter for fracture with routine healing: Secondary | ICD-10-CM | POA: Diagnosis not present

## 2016-08-18 DIAGNOSIS — I10 Essential (primary) hypertension: Secondary | ICD-10-CM | POA: Diagnosis not present

## 2016-08-18 DIAGNOSIS — W108XXD Fall (on) (from) other stairs and steps, subsequent encounter: Secondary | ICD-10-CM | POA: Diagnosis not present

## 2016-08-18 DIAGNOSIS — G309 Alzheimer's disease, unspecified: Secondary | ICD-10-CM | POA: Diagnosis not present

## 2016-08-18 DIAGNOSIS — S12100D Unspecified displaced fracture of second cervical vertebra, subsequent encounter for fracture with routine healing: Secondary | ICD-10-CM | POA: Diagnosis not present

## 2016-08-18 DIAGNOSIS — S0231XD Fracture of orbital floor, right side, subsequent encounter for fracture with routine healing: Secondary | ICD-10-CM | POA: Diagnosis not present

## 2016-08-18 DIAGNOSIS — F0280 Dementia in other diseases classified elsewhere without behavioral disturbance: Secondary | ICD-10-CM | POA: Diagnosis not present

## 2016-08-18 DIAGNOSIS — S0281XD Fracture of other specified skull and facial bones, right side, subsequent encounter for fracture with routine healing: Secondary | ICD-10-CM | POA: Diagnosis not present

## 2016-08-18 MED ORDER — AMLODIPINE BESYLATE 5 MG PO TABS: 5.0000 mg | ORAL_TABLET | Freq: Every day | ORAL | 0 refills | Status: DC

## 2016-08-18 NOTE — Progress Notes (Signed)
Subjective:    Patient ID: Teresa Francis, female    DOB: 1940-10-05, 76 y.o.   MRN: [id number]  HPI  Pt presents to the clinic today for hospital followup. She went to the ER 4/28, after she blacked out, fell down the stairs. CT head was negative. Xrays of the neck showed a C2/C3 fracture. Xray of the face showed a right orbital floor and sinus fracture. Xray of knees showed bilateral patellar fractures. Plastic surgery was consulted, fixed the laceration of her face. They opted for non operative management of her facial fractures. Orthopedics was consulted. She was placed in a C Collar and bilateral knee braces. No operative procedures were performed. Cardiology was consulted due to her syncopal episode. She did have a few runs of SVT. She was placed on Amlodipine for blood pressure control. Echo was normal. She was discharged with a 30 day heart monitor. She was discharged home under the care of her duaghter who accompanied her today. Since discharge, she has already followed up with plastics and ortho- notes reviewed. She reports her pain is minimal. She has no concerns today.  Review of Systems      Past Medical History:  Diagnosis Date  . Alzheimer disease   . Depression   . GERD (gastroesophageal reflux disease)   . Hyperlipidemia   . Hypertension     Current Outpatient Prescriptions  Medication Sig Dispense Refill  . acetaminophen (TYLENOL) 325 MG tablet Take 2 tablets (650 mg total) by mouth every 6 (six) hours as needed for mild pain.    Marland Kitchen amLODipine (NORVASC) 2.5 MG tablet Take 1 tablet (2.5 mg total) by mouth daily. 30 tablet 0  . aspirin 81 MG tablet Take 81 mg by mouth daily.    . cholecalciferol (VITAMIN D) 1000 units tablet Take 1,000 Units by mouth daily.    Marland Kitchen donepezil (ARICEPT) 10 MG tablet TAKE 1 TABLET AT BEDTIME 90 tablet 1  . ibandronate (BONIVA) 150 MG tablet Take 1 tablet (150 mg total) by mouth every 30 (thirty) days. Take in the morning with a full glass of  water, on an empty stomach, and do not take anything else by mouth or lie down for the next 30 min. 3 tablet 4  . Melatonin 5 MG TABS Take 1 tablet by mouth at bedtime and may repeat dose one time if needed.     . memantine (NAMENDA) 5 MG tablet TAKE 1 TABLET TWICE DAILY 180 tablet 1  . oxyCODONE (OXY IR/ROXICODONE) 5 MG immediate release tablet Take 1 tablet (5 mg total) by mouth every 6 (six) hours as needed for moderate pain or severe pain. 20 tablet 0  . pantoprazole (PROTONIX) 40 MG tablet Take 1 tablet (40 mg total) by mouth 2 (two) times daily. 180 tablet 3  . sertraline (ZOLOFT) 50 MG tablet TAKE 1 TABLET EVERY DAY 90 tablet 1  . simvastatin (ZOCOR) 40 MG tablet TAKE 1 TABLET DAILY AT 6 PM 90 tablet 1  . vitamin B-12 (CYANOCOBALAMIN) 1000 MCG tablet Take 1,000 mcg by mouth daily.    . vitamin E 400 UNIT capsule Take 400 Units by mouth daily.     No current facility-administered medications for this visit.     Allergies  Allergen Reactions  . Lisinopril Cough    Family History  Problem Relation Age of Onset  . Heart disease Mother   . Hypertension Mother   . Arthritis Father   . Cancer Father  Prostate  . Cancer Son        Lung  . Cancer Sister        Breast  . Arthritis Sister   . Cancer Brother        Throat  . Hypertension Daughter   . Diabetes Paternal Aunt   . Diabetes Paternal Uncle   . Diabetes Paternal Grandfather   . Cancer Sister        Breast  . Arthritis Sister   . Cancer Sister        Breast  . Arthritis Sister   . Cancer Brother        Prostate  . Diabetes Daughter        Uterine  . Stroke Neg Hx     Social History   Social History  . Marital status: Widowed    Spouse name: N/A  . Number of children: N/A  . Years of education: N/A   Occupational History  . Not on file.   Social History Main Topics  . Smoking status: Never Smoker  . Smokeless tobacco: Never Used  . Alcohol use No  . Drug use: No  . Sexual activity: Not on file    Other Topics Concern  . Not on file   Social History Narrative  . No narrative on file     Constitutional: Denies fever, malaise, fatigue, headache or abrupt weight changes.  Respiratory: Denies difficulty breathing, shortness of breath, cough or sputum production.   Cardiovascular: Denies chest pain, chest tightness, palpitations or swelling in the hands or feet.  Musculoskeletal: Pt reports intermittent neck and knee pain. Denies decrease in range of motion, difficulty with gait, muscle pain or joint swelling.  Neurological: Denies dizziness, difficulty with memory, difficulty with speech or problems with balance and coordination.    No other specific complaints in a complete review of systems (except as listed in HPI above).  Objective:   Physical Exam   BP (!) 144/82   Pulse 67   Temp 98.4 F (36.9 C) (Oral)   Wt 176 lb (79.8 kg)   SpO2 96%   BMI 25.62 kg/m  Wt Readings from Last 3 Encounters:  08/18/16 176 lb (79.8 kg)  07/12/16 188 lb 0.8 oz (85.3 kg)  02/28/16 183 lb (83 kg)    General: Appears her stated age, in NAD. Skin: Laceration ot forehead healed. Cardiovascular: Normal rate and rhythm.  Pulmonary/Chest: Normal effort and positive vesicular breath sounds. No respiratory distress. No wheezes, rales or ronchi noted.  Abdomen: Soft and nontender. Normal bowel sounds.  Musculoskeletal: Normal flexion and extension of the cervical spine. Pain with rotation. Normal flexion and extension of the right knee. Left knee is in a brace. Gait slow but steady with use of a walker.  Neurological: Alert and oriented.    BMET    Component Value Date/Time   NA 140 07/13/2016 0316   K 3.9 07/13/2016 0316   CL 106 07/13/2016 0316   CO2 28 07/13/2016 0316   GLUCOSE 118 (H) 07/13/2016 0316   BUN 12 07/13/2016 0316   CREATININE 0.90 07/13/2016 0316   CALCIUM 8.7 (L) 07/13/2016 0316   GFRNONAA >60 07/13/2016 0316   GFRAA >60 07/13/2016 0316    Lipid Panel       Component Value Date/Time   CHOL 190 11/06/2015 1603   TRIG 170.0 (H) 11/06/2015 1603   HDL 45.90 11/06/2015 1603   CHOLHDL 4 11/06/2015 1603   VLDL 34.0 11/06/2015 1603   LDLCALC  110 (H) 11/06/2015 1603    CBC    Component Value Date/Time   WBC 10.0 07/13/2016 0316   RBC 4.29 07/13/2016 0316   HGB 11.5 (L) 07/13/2016 0316   HCT 36.0 07/13/2016 0316   PLT 280 07/13/2016 0316   MCV 83.9 07/13/2016 0316   MCH 26.8 07/13/2016 0316   MCHC 31.9 07/13/2016 0316   RDW 16.0 (H) 07/13/2016 0316   LYMPHSABS 2.5 07/11/2016 2127   MONOABS 0.6 07/11/2016 2127   EOSABS 0.0 07/11/2016 2127   BASOSABS 0.0 07/11/2016 2127    Hgb A1C No results found for: HGBA1C         Assessment & Plan:   Hospital Follow up for Fall, Orbital Floor Fracture, Sinus Fracture, Bilateral Patellar Fracture and Cervical Spine Fracture:  Hospital notes, labs and imaging reviewed She is working with home PT She seems to be doing well  HTN:  Increase Amlodipine to 5 mg daily, eRx sent to pharmacy   RTC in 2 months for AWV Webb Silversmith, NP

## 2016-08-19 DIAGNOSIS — S82035D Nondisplaced transverse fracture of left patella, subsequent encounter for closed fracture with routine healing: Secondary | ICD-10-CM | POA: Diagnosis not present

## 2016-08-19 DIAGNOSIS — S0281XD Fracture of other specified skull and facial bones, right side, subsequent encounter for fracture with routine healing: Secondary | ICD-10-CM | POA: Diagnosis not present

## 2016-08-19 DIAGNOSIS — S12100D Unspecified displaced fracture of second cervical vertebra, subsequent encounter for fracture with routine healing: Secondary | ICD-10-CM | POA: Diagnosis not present

## 2016-08-19 DIAGNOSIS — I471 Supraventricular tachycardia: Secondary | ICD-10-CM | POA: Diagnosis not present

## 2016-08-19 DIAGNOSIS — S12200D Unspecified displaced fracture of third cervical vertebra, subsequent encounter for fracture with routine healing: Secondary | ICD-10-CM | POA: Diagnosis not present

## 2016-08-19 DIAGNOSIS — F0280 Dementia in other diseases classified elsewhere without behavioral disturbance: Secondary | ICD-10-CM | POA: Diagnosis not present

## 2016-08-19 DIAGNOSIS — G309 Alzheimer's disease, unspecified: Secondary | ICD-10-CM | POA: Diagnosis not present

## 2016-08-19 DIAGNOSIS — S82001D Unspecified fracture of right patella, subsequent encounter for closed fracture with routine healing: Secondary | ICD-10-CM | POA: Diagnosis not present

## 2016-08-19 DIAGNOSIS — S2232XD Fracture of one rib, left side, subsequent encounter for fracture with routine healing: Secondary | ICD-10-CM | POA: Diagnosis not present

## 2016-08-19 NOTE — Patient Instructions (Signed)

## 2016-08-20 DIAGNOSIS — S12200D Unspecified displaced fracture of third cervical vertebra, subsequent encounter for fracture with routine healing: Secondary | ICD-10-CM | POA: Diagnosis not present

## 2016-08-20 DIAGNOSIS — S12100D Unspecified displaced fracture of second cervical vertebra, subsequent encounter for fracture with routine healing: Secondary | ICD-10-CM | POA: Diagnosis not present

## 2016-08-20 DIAGNOSIS — S82035D Nondisplaced transverse fracture of left patella, subsequent encounter for closed fracture with routine healing: Secondary | ICD-10-CM | POA: Diagnosis not present

## 2016-08-20 DIAGNOSIS — S0281XD Fracture of other specified skull and facial bones, right side, subsequent encounter for fracture with routine healing: Secondary | ICD-10-CM | POA: Diagnosis not present

## 2016-08-20 DIAGNOSIS — F0280 Dementia in other diseases classified elsewhere without behavioral disturbance: Secondary | ICD-10-CM | POA: Diagnosis not present

## 2016-08-20 DIAGNOSIS — S2232XD Fracture of one rib, left side, subsequent encounter for fracture with routine healing: Secondary | ICD-10-CM | POA: Diagnosis not present

## 2016-08-20 DIAGNOSIS — S82001D Unspecified fracture of right patella, subsequent encounter for closed fracture with routine healing: Secondary | ICD-10-CM | POA: Diagnosis not present

## 2016-08-20 DIAGNOSIS — I471 Supraventricular tachycardia: Secondary | ICD-10-CM | POA: Diagnosis not present

## 2016-08-20 DIAGNOSIS — G309 Alzheimer's disease, unspecified: Secondary | ICD-10-CM | POA: Diagnosis not present

## 2016-08-21 DIAGNOSIS — S0281XD Fracture of other specified skull and facial bones, right side, subsequent encounter for fracture with routine healing: Secondary | ICD-10-CM | POA: Diagnosis not present

## 2016-08-21 DIAGNOSIS — G309 Alzheimer's disease, unspecified: Secondary | ICD-10-CM | POA: Diagnosis not present

## 2016-08-21 DIAGNOSIS — S82001D Unspecified fracture of right patella, subsequent encounter for closed fracture with routine healing: Secondary | ICD-10-CM | POA: Diagnosis not present

## 2016-08-21 DIAGNOSIS — S12100D Unspecified displaced fracture of second cervical vertebra, subsequent encounter for fracture with routine healing: Secondary | ICD-10-CM | POA: Diagnosis not present

## 2016-08-21 DIAGNOSIS — S82035D Nondisplaced transverse fracture of left patella, subsequent encounter for closed fracture with routine healing: Secondary | ICD-10-CM | POA: Diagnosis not present

## 2016-08-21 DIAGNOSIS — S2232XD Fracture of one rib, left side, subsequent encounter for fracture with routine healing: Secondary | ICD-10-CM | POA: Diagnosis not present

## 2016-08-21 DIAGNOSIS — S12200D Unspecified displaced fracture of third cervical vertebra, subsequent encounter for fracture with routine healing: Secondary | ICD-10-CM | POA: Diagnosis not present

## 2016-08-21 DIAGNOSIS — F0280 Dementia in other diseases classified elsewhere without behavioral disturbance: Secondary | ICD-10-CM | POA: Diagnosis not present

## 2016-08-21 DIAGNOSIS — I471 Supraventricular tachycardia: Secondary | ICD-10-CM | POA: Diagnosis not present

## 2016-08-24 DIAGNOSIS — G309 Alzheimer's disease, unspecified: Secondary | ICD-10-CM | POA: Diagnosis not present

## 2016-08-24 DIAGNOSIS — S82035D Nondisplaced transverse fracture of left patella, subsequent encounter for closed fracture with routine healing: Secondary | ICD-10-CM | POA: Diagnosis not present

## 2016-08-24 DIAGNOSIS — S12200D Unspecified displaced fracture of third cervical vertebra, subsequent encounter for fracture with routine healing: Secondary | ICD-10-CM | POA: Diagnosis not present

## 2016-08-24 DIAGNOSIS — S2232XD Fracture of one rib, left side, subsequent encounter for fracture with routine healing: Secondary | ICD-10-CM | POA: Diagnosis not present

## 2016-08-24 DIAGNOSIS — S12100D Unspecified displaced fracture of second cervical vertebra, subsequent encounter for fracture with routine healing: Secondary | ICD-10-CM | POA: Diagnosis not present

## 2016-08-24 DIAGNOSIS — S0281XD Fracture of other specified skull and facial bones, right side, subsequent encounter for fracture with routine healing: Secondary | ICD-10-CM | POA: Diagnosis not present

## 2016-08-24 DIAGNOSIS — F0280 Dementia in other diseases classified elsewhere without behavioral disturbance: Secondary | ICD-10-CM | POA: Diagnosis not present

## 2016-08-24 DIAGNOSIS — I471 Supraventricular tachycardia: Secondary | ICD-10-CM | POA: Diagnosis not present

## 2016-08-24 DIAGNOSIS — S82001D Unspecified fracture of right patella, subsequent encounter for closed fracture with routine healing: Secondary | ICD-10-CM | POA: Diagnosis not present

## 2016-08-25 DIAGNOSIS — S12100D Unspecified displaced fracture of second cervical vertebra, subsequent encounter for fracture with routine healing: Secondary | ICD-10-CM | POA: Diagnosis not present

## 2016-08-25 DIAGNOSIS — S82035D Nondisplaced transverse fracture of left patella, subsequent encounter for closed fracture with routine healing: Secondary | ICD-10-CM | POA: Diagnosis not present

## 2016-08-25 DIAGNOSIS — F0280 Dementia in other diseases classified elsewhere without behavioral disturbance: Secondary | ICD-10-CM | POA: Diagnosis not present

## 2016-08-25 DIAGNOSIS — S82001D Unspecified fracture of right patella, subsequent encounter for closed fracture with routine healing: Secondary | ICD-10-CM | POA: Diagnosis not present

## 2016-08-25 DIAGNOSIS — S0281XD Fracture of other specified skull and facial bones, right side, subsequent encounter for fracture with routine healing: Secondary | ICD-10-CM | POA: Diagnosis not present

## 2016-08-25 DIAGNOSIS — S2232XD Fracture of one rib, left side, subsequent encounter for fracture with routine healing: Secondary | ICD-10-CM | POA: Diagnosis not present

## 2016-08-25 DIAGNOSIS — S12200D Unspecified displaced fracture of third cervical vertebra, subsequent encounter for fracture with routine healing: Secondary | ICD-10-CM | POA: Diagnosis not present

## 2016-08-25 DIAGNOSIS — G309 Alzheimer's disease, unspecified: Secondary | ICD-10-CM | POA: Diagnosis not present

## 2016-08-25 DIAGNOSIS — I471 Supraventricular tachycardia: Secondary | ICD-10-CM | POA: Diagnosis not present

## 2016-08-26 DIAGNOSIS — I471 Supraventricular tachycardia: Secondary | ICD-10-CM | POA: Diagnosis not present

## 2016-08-26 DIAGNOSIS — S0281XD Fracture of other specified skull and facial bones, right side, subsequent encounter for fracture with routine healing: Secondary | ICD-10-CM | POA: Diagnosis not present

## 2016-08-26 DIAGNOSIS — F0280 Dementia in other diseases classified elsewhere without behavioral disturbance: Secondary | ICD-10-CM | POA: Diagnosis not present

## 2016-08-26 DIAGNOSIS — G309 Alzheimer's disease, unspecified: Secondary | ICD-10-CM | POA: Diagnosis not present

## 2016-08-26 DIAGNOSIS — S82001D Unspecified fracture of right patella, subsequent encounter for closed fracture with routine healing: Secondary | ICD-10-CM | POA: Diagnosis not present

## 2016-08-26 DIAGNOSIS — S2232XD Fracture of one rib, left side, subsequent encounter for fracture with routine healing: Secondary | ICD-10-CM | POA: Diagnosis not present

## 2016-08-26 DIAGNOSIS — S12100D Unspecified displaced fracture of second cervical vertebra, subsequent encounter for fracture with routine healing: Secondary | ICD-10-CM | POA: Diagnosis not present

## 2016-08-26 DIAGNOSIS — S82035D Nondisplaced transverse fracture of left patella, subsequent encounter for closed fracture with routine healing: Secondary | ICD-10-CM | POA: Diagnosis not present

## 2016-08-26 DIAGNOSIS — S12200D Unspecified displaced fracture of third cervical vertebra, subsequent encounter for fracture with routine healing: Secondary | ICD-10-CM | POA: Diagnosis not present

## 2016-08-27 DIAGNOSIS — S82035D Nondisplaced transverse fracture of left patella, subsequent encounter for closed fracture with routine healing: Secondary | ICD-10-CM | POA: Diagnosis not present

## 2016-08-27 DIAGNOSIS — S12200D Unspecified displaced fracture of third cervical vertebra, subsequent encounter for fracture with routine healing: Secondary | ICD-10-CM | POA: Diagnosis not present

## 2016-08-27 DIAGNOSIS — S2232XD Fracture of one rib, left side, subsequent encounter for fracture with routine healing: Secondary | ICD-10-CM | POA: Diagnosis not present

## 2016-08-27 DIAGNOSIS — I471 Supraventricular tachycardia: Secondary | ICD-10-CM | POA: Diagnosis not present

## 2016-08-27 DIAGNOSIS — S0281XD Fracture of other specified skull and facial bones, right side, subsequent encounter for fracture with routine healing: Secondary | ICD-10-CM | POA: Diagnosis not present

## 2016-08-27 DIAGNOSIS — G309 Alzheimer's disease, unspecified: Secondary | ICD-10-CM | POA: Diagnosis not present

## 2016-08-27 DIAGNOSIS — S12100D Unspecified displaced fracture of second cervical vertebra, subsequent encounter for fracture with routine healing: Secondary | ICD-10-CM | POA: Diagnosis not present

## 2016-08-27 DIAGNOSIS — S82001D Unspecified fracture of right patella, subsequent encounter for closed fracture with routine healing: Secondary | ICD-10-CM | POA: Diagnosis not present

## 2016-08-27 DIAGNOSIS — F0280 Dementia in other diseases classified elsewhere without behavioral disturbance: Secondary | ICD-10-CM | POA: Diagnosis not present

## 2016-08-28 DIAGNOSIS — I471 Supraventricular tachycardia: Secondary | ICD-10-CM | POA: Diagnosis not present

## 2016-08-28 DIAGNOSIS — S12100D Unspecified displaced fracture of second cervical vertebra, subsequent encounter for fracture with routine healing: Secondary | ICD-10-CM | POA: Diagnosis not present

## 2016-08-28 DIAGNOSIS — G309 Alzheimer's disease, unspecified: Secondary | ICD-10-CM | POA: Diagnosis not present

## 2016-08-28 DIAGNOSIS — S2232XD Fracture of one rib, left side, subsequent encounter for fracture with routine healing: Secondary | ICD-10-CM | POA: Diagnosis not present

## 2016-08-28 DIAGNOSIS — S82035D Nondisplaced transverse fracture of left patella, subsequent encounter for closed fracture with routine healing: Secondary | ICD-10-CM | POA: Diagnosis not present

## 2016-08-28 DIAGNOSIS — S0281XD Fracture of other specified skull and facial bones, right side, subsequent encounter for fracture with routine healing: Secondary | ICD-10-CM | POA: Diagnosis not present

## 2016-08-28 DIAGNOSIS — S12200D Unspecified displaced fracture of third cervical vertebra, subsequent encounter for fracture with routine healing: Secondary | ICD-10-CM | POA: Diagnosis not present

## 2016-08-28 DIAGNOSIS — S82001D Unspecified fracture of right patella, subsequent encounter for closed fracture with routine healing: Secondary | ICD-10-CM | POA: Diagnosis not present

## 2016-08-28 DIAGNOSIS — F0280 Dementia in other diseases classified elsewhere without behavioral disturbance: Secondary | ICD-10-CM | POA: Diagnosis not present

## 2016-08-31 ENCOUNTER — Encounter: Payer: Self-pay | Admitting: Cardiology

## 2016-08-31 ENCOUNTER — Ambulatory Visit (INDEPENDENT_AMBULATORY_CARE_PROVIDER_SITE_OTHER): Payer: Medicare HMO | Admitting: Cardiology

## 2016-08-31 VITALS — BP 138/66 | HR 68 | Ht 69.5 in | Wt 176.0 lb

## 2016-08-31 DIAGNOSIS — I10 Essential (primary) hypertension: Secondary | ICD-10-CM

## 2016-08-31 DIAGNOSIS — F0280 Dementia in other diseases classified elsewhere without behavioral disturbance: Secondary | ICD-10-CM

## 2016-08-31 DIAGNOSIS — G308 Other Alzheimer's disease: Secondary | ICD-10-CM

## 2016-08-31 DIAGNOSIS — W19XXXA Unspecified fall, initial encounter: Secondary | ICD-10-CM | POA: Insufficient documentation

## 2016-08-31 DIAGNOSIS — W19XXXD Unspecified fall, subsequent encounter: Secondary | ICD-10-CM | POA: Diagnosis not present

## 2016-08-31 DIAGNOSIS — I471 Supraventricular tachycardia: Secondary | ICD-10-CM

## 2016-08-31 NOTE — Patient Instructions (Signed)
Medication Instructions:   No changes to current medication regimen.  Labwork:   None ordered.  Testing/Procedures:  None ordered.  Follow-Up:  As needed with cardiology.    If you need a refill on your cardiac medications before your next appointment, please call your pharmacy.

## 2016-08-31 NOTE — Assessment & Plan Note (Signed)
Medications being adjusted by PCP

## 2016-08-31 NOTE — Assessment & Plan Note (Signed)
Pt admitted May 2018 after a fall at home

## 2016-08-31 NOTE — Assessment & Plan Note (Signed)
Pt is here with her daughter [name]- a CMA at Dr Laurelyn Sickle office

## 2016-08-31 NOTE — Assessment & Plan Note (Signed)
Brief PSVT in hospital after her fall, not felt to be the cause of her fall. 30 day Monitor did not demonstrate any significant arrhythmia.

## 2016-08-31 NOTE — Progress Notes (Signed)
08/31/2016 Teresa Francis   [birth year]  [id number]  Primary Physician Jearld Fenton, NP Primary Cardiologist: Dr Oval Linsey  HPI:  76 y/o female with a history of Alzheimer's dementia, admitted to the hospital in May 2018 after a fall. While on telemetry a run of PSVT was noted and cardiology saw her in consult. Dr Oval Linsey did not feel this was related to her fall, and was an incidental finding. Echo was normal. F/U 30 day monitor was unremarkable. The pt is here with her daughter Teresa Francis who is a CMA at Dr [employer] office in [place]. Teresa Francis was upset about communication in the hospital. She had to work and could not be present during rounds and she told me despite requests she was never contacted by a provider for updates or plans for follow up testing.    Current Outpatient Prescriptions  Medication Sig Dispense Refill  . acetaminophen (TYLENOL) 325 MG tablet Take 2 tablets (650 mg total) by mouth every 6 (six) hours as needed for mild pain.    Marland Kitchen amLODipine (NORVASC) 5 MG tablet Take 1 tablet (5 mg total) by mouth daily. 90 tablet 0  . aspirin 81 MG tablet Take 81 mg by mouth daily.    . cholecalciferol (VITAMIN D) 1000 units tablet Take 1,000 Units by mouth daily.    Marland Kitchen donepezil (ARICEPT) 10 MG tablet TAKE 1 TABLET AT BEDTIME 90 tablet 1  . ibandronate (BONIVA) 150 MG tablet Take 1 tablet (150 mg total) by mouth every 30 (thirty) days. Take in the morning with a full glass of water, on an empty stomach, and do not take anything else by mouth or lie down for the next 30 min. 3 tablet 4  . Melatonin 5 MG TABS Take 1 tablet by mouth at bedtime and may repeat dose one time if needed.     . memantine (NAMENDA) 5 MG tablet TAKE 1 TABLET TWICE DAILY 180 tablet 1  . oxyCODONE (OXY IR/ROXICODONE) 5 MG immediate release tablet Take 1 tablet (5 mg total) by mouth every 6 (six) hours as needed for moderate pain or severe pain. 20 tablet 0  . pantoprazole (PROTONIX) 40 MG tablet Take 1 tablet (40 mg  total) by mouth 2 (two) times daily. 180 tablet 3  . sertraline (ZOLOFT) 50 MG tablet TAKE 1 TABLET EVERY DAY 90 tablet 1  . simvastatin (ZOCOR) 40 MG tablet TAKE 1 TABLET DAILY AT 6 PM 90 tablet 1  . vitamin B-12 (CYANOCOBALAMIN) 1000 MCG tablet Take 1,000 mcg by mouth daily.    . vitamin E 400 UNIT capsule Take 400 Units by mouth daily.     No current facility-administered medications for this visit.     Allergies  Allergen Reactions  . Lisinopril Cough    Past Medical History:  Diagnosis Date  . Alzheimer disease   . Depression   . GERD (gastroesophageal reflux disease)   . Hyperlipidemia   . Hypertension     Social History   Social History  . Marital status: Widowed    Spouse name: N/A  . Number of children: N/A  . Years of education: N/A   Occupational History  . Not on file.   Social History Main Topics  . Smoking status: Never Smoker  . Smokeless tobacco: Never Used  . Alcohol use No  . Drug use: No  . Sexual activity: Not on file   Other Topics Concern  . Not on file   Social History Narrative  .  No narrative on file     Family History  Problem Relation Age of Onset  . Heart disease Mother   . Hypertension Mother   . Arthritis Father   . Cancer Father        Prostate  . Cancer Son        Lung  . Cancer Sister        Breast  . Arthritis Sister   . Cancer Brother        Throat  . Hypertension Daughter   . Diabetes Paternal Aunt   . Diabetes Paternal Uncle   . Diabetes Paternal Grandfather   . Cancer Sister        Breast  . Arthritis Sister   . Cancer Sister        Breast  . Arthritis Sister   . Cancer Brother        Prostate  . Diabetes Daughter        Uterine  . Stroke Neg Hx      Review of Systems: General: negative for chills, fever, night sweats or weight changes.  Cardiovascular: negative for chest pain, dyspnea on exertion, edema, orthopnea, palpitations, paroxysmal nocturnal dyspnea or shortness of breath Dermatological:  negative for rash Respiratory: negative for cough or wheezing Urologic: negative for hematuria Abdominal: negative for nausea, vomiting, diarrhea, bright red blood per rectum, melena, or hematemesis Neurologic: negative for visual changes, syncope, or dizziness All other systems reviewed and are otherwise negative except as noted above.    Blood pressure 138/66, pulse 68, height 5' 9.5" (1.765 m), weight 176 lb (79.8 kg).  General appearance: alert, cooperative, appears older than stated age and no distress Neck: no carotid bruit and no JVD Lungs: clear to auscultation bilaterally Heart: regular rate and rhythm Extremities: extremities normal, atraumatic, no cyanosis or edema Neurologic: Grossly normal   ASSESSMENT AND PLAN:   Fall Pt admitted May 2018 after a fall at home  SVT (supraventricular tachycardia) ([place]) Brief PSVT in hospital after her fall, not felt to be the cause of her fall. 30 day Monitor did not demonstrate any significant arrhythmia.   Essential hypertension Medications being adjusted by PCP  Alzheimer's dementia Pt is here with her daughter Teresa Francis- a CMA at Dr [facility] office   PLAN  PRN f/u. I discussed the problem with communication between family's and the pt's provider when the family can't be present and agreed with her that was not handled well.   Kerin Ransom PA-C 08/31/2016 11:49 AM

## 2016-09-01 DIAGNOSIS — S2232XD Fracture of one rib, left side, subsequent encounter for fracture with routine healing: Secondary | ICD-10-CM | POA: Diagnosis not present

## 2016-09-01 DIAGNOSIS — I471 Supraventricular tachycardia: Secondary | ICD-10-CM | POA: Diagnosis not present

## 2016-09-01 DIAGNOSIS — S82001D Unspecified fracture of right patella, subsequent encounter for closed fracture with routine healing: Secondary | ICD-10-CM | POA: Diagnosis not present

## 2016-09-01 DIAGNOSIS — S82035D Nondisplaced transverse fracture of left patella, subsequent encounter for closed fracture with routine healing: Secondary | ICD-10-CM | POA: Diagnosis not present

## 2016-09-01 DIAGNOSIS — G309 Alzheimer's disease, unspecified: Secondary | ICD-10-CM | POA: Diagnosis not present

## 2016-09-01 DIAGNOSIS — S0281XD Fracture of other specified skull and facial bones, right side, subsequent encounter for fracture with routine healing: Secondary | ICD-10-CM | POA: Diagnosis not present

## 2016-09-01 DIAGNOSIS — F0280 Dementia in other diseases classified elsewhere without behavioral disturbance: Secondary | ICD-10-CM | POA: Diagnosis not present

## 2016-09-01 DIAGNOSIS — S12100D Unspecified displaced fracture of second cervical vertebra, subsequent encounter for fracture with routine healing: Secondary | ICD-10-CM | POA: Diagnosis not present

## 2016-09-01 DIAGNOSIS — S12200D Unspecified displaced fracture of third cervical vertebra, subsequent encounter for fracture with routine healing: Secondary | ICD-10-CM | POA: Diagnosis not present

## 2016-09-02 DIAGNOSIS — G309 Alzheimer's disease, unspecified: Secondary | ICD-10-CM | POA: Diagnosis not present

## 2016-09-02 DIAGNOSIS — S2232XD Fracture of one rib, left side, subsequent encounter for fracture with routine healing: Secondary | ICD-10-CM | POA: Diagnosis not present

## 2016-09-02 DIAGNOSIS — S82035D Nondisplaced transverse fracture of left patella, subsequent encounter for closed fracture with routine healing: Secondary | ICD-10-CM | POA: Diagnosis not present

## 2016-09-02 DIAGNOSIS — S0281XD Fracture of other specified skull and facial bones, right side, subsequent encounter for fracture with routine healing: Secondary | ICD-10-CM | POA: Diagnosis not present

## 2016-09-02 DIAGNOSIS — S12200D Unspecified displaced fracture of third cervical vertebra, subsequent encounter for fracture with routine healing: Secondary | ICD-10-CM | POA: Diagnosis not present

## 2016-09-02 DIAGNOSIS — S82001D Unspecified fracture of right patella, subsequent encounter for closed fracture with routine healing: Secondary | ICD-10-CM | POA: Diagnosis not present

## 2016-09-02 DIAGNOSIS — F0280 Dementia in other diseases classified elsewhere without behavioral disturbance: Secondary | ICD-10-CM | POA: Diagnosis not present

## 2016-09-02 DIAGNOSIS — S12100D Unspecified displaced fracture of second cervical vertebra, subsequent encounter for fracture with routine healing: Secondary | ICD-10-CM | POA: Diagnosis not present

## 2016-09-02 DIAGNOSIS — I471 Supraventricular tachycardia: Secondary | ICD-10-CM | POA: Diagnosis not present

## 2016-09-03 DIAGNOSIS — S0281XD Fracture of other specified skull and facial bones, right side, subsequent encounter for fracture with routine healing: Secondary | ICD-10-CM | POA: Diagnosis not present

## 2016-09-03 DIAGNOSIS — I471 Supraventricular tachycardia: Secondary | ICD-10-CM | POA: Diagnosis not present

## 2016-09-03 DIAGNOSIS — S82001D Unspecified fracture of right patella, subsequent encounter for closed fracture with routine healing: Secondary | ICD-10-CM | POA: Diagnosis not present

## 2016-09-03 DIAGNOSIS — F0280 Dementia in other diseases classified elsewhere without behavioral disturbance: Secondary | ICD-10-CM | POA: Diagnosis not present

## 2016-09-03 DIAGNOSIS — S12200D Unspecified displaced fracture of third cervical vertebra, subsequent encounter for fracture with routine healing: Secondary | ICD-10-CM | POA: Diagnosis not present

## 2016-09-03 DIAGNOSIS — G309 Alzheimer's disease, unspecified: Secondary | ICD-10-CM | POA: Diagnosis not present

## 2016-09-03 DIAGNOSIS — S82035D Nondisplaced transverse fracture of left patella, subsequent encounter for closed fracture with routine healing: Secondary | ICD-10-CM | POA: Diagnosis not present

## 2016-09-03 DIAGNOSIS — S12100D Unspecified displaced fracture of second cervical vertebra, subsequent encounter for fracture with routine healing: Secondary | ICD-10-CM | POA: Diagnosis not present

## 2016-09-03 DIAGNOSIS — S2232XD Fracture of one rib, left side, subsequent encounter for fracture with routine healing: Secondary | ICD-10-CM | POA: Diagnosis not present

## 2016-09-04 DIAGNOSIS — I471 Supraventricular tachycardia: Secondary | ICD-10-CM | POA: Diagnosis not present

## 2016-09-04 DIAGNOSIS — S82001D Unspecified fracture of right patella, subsequent encounter for closed fracture with routine healing: Secondary | ICD-10-CM | POA: Diagnosis not present

## 2016-09-04 DIAGNOSIS — S12100D Unspecified displaced fracture of second cervical vertebra, subsequent encounter for fracture with routine healing: Secondary | ICD-10-CM | POA: Diagnosis not present

## 2016-09-04 DIAGNOSIS — S82035D Nondisplaced transverse fracture of left patella, subsequent encounter for closed fracture with routine healing: Secondary | ICD-10-CM | POA: Diagnosis not present

## 2016-09-04 DIAGNOSIS — F0280 Dementia in other diseases classified elsewhere without behavioral disturbance: Secondary | ICD-10-CM | POA: Diagnosis not present

## 2016-09-04 DIAGNOSIS — S2232XD Fracture of one rib, left side, subsequent encounter for fracture with routine healing: Secondary | ICD-10-CM | POA: Diagnosis not present

## 2016-09-04 DIAGNOSIS — S0281XD Fracture of other specified skull and facial bones, right side, subsequent encounter for fracture with routine healing: Secondary | ICD-10-CM | POA: Diagnosis not present

## 2016-09-04 DIAGNOSIS — G309 Alzheimer's disease, unspecified: Secondary | ICD-10-CM | POA: Diagnosis not present

## 2016-09-04 DIAGNOSIS — S12200D Unspecified displaced fracture of third cervical vertebra, subsequent encounter for fracture with routine healing: Secondary | ICD-10-CM | POA: Diagnosis not present

## 2016-09-19 ENCOUNTER — Encounter: Payer: Self-pay | Admitting: Internal Medicine

## 2016-09-20 ENCOUNTER — Other Ambulatory Visit: Payer: Self-pay | Admitting: Internal Medicine

## 2016-09-21 ENCOUNTER — Encounter: Payer: Self-pay | Admitting: Internal Medicine

## 2016-09-22 ENCOUNTER — Ambulatory Visit (INDEPENDENT_AMBULATORY_CARE_PROVIDER_SITE_OTHER): Payer: Medicare HMO | Admitting: Orthopaedic Surgery

## 2016-09-22 ENCOUNTER — Ambulatory Visit (INDEPENDENT_AMBULATORY_CARE_PROVIDER_SITE_OTHER): Payer: Medicare HMO

## 2016-09-22 DIAGNOSIS — G8929 Other chronic pain: Secondary | ICD-10-CM

## 2016-09-22 DIAGNOSIS — M25562 Pain in left knee: Secondary | ICD-10-CM | POA: Diagnosis not present

## 2016-09-22 DIAGNOSIS — S82035A Nondisplaced transverse fracture of left patella, initial encounter for closed fracture: Secondary | ICD-10-CM

## 2016-09-22 MED ORDER — MEMANTINE HCL 5 MG PO TABS: 5.0000 mg | ORAL_TABLET | Freq: Two times a day (BID) | ORAL | 0 refills | Status: DC

## 2016-09-22 NOTE — Progress Notes (Signed)
Patient is 10 weeks status post left nondisplaced transverse patella fracture. She is doing well. She is now wearing a brace. Denies any pain. She has excellent range of motion of her knee without any tenderness to palpation. X-ray shows healed patella fracture. Patient is released at this point she has reached MMI. Questions encouraged and answered.

## 2016-11-11 ENCOUNTER — Ambulatory Visit (INDEPENDENT_AMBULATORY_CARE_PROVIDER_SITE_OTHER): Payer: Medicare HMO | Admitting: Internal Medicine

## 2016-11-11 ENCOUNTER — Encounter: Payer: Self-pay | Admitting: Internal Medicine

## 2016-11-11 VITALS — BP 138/72 | HR 65 | Temp 98.0°F | Ht 69.0 in | Wt 175.0 lb

## 2016-11-11 DIAGNOSIS — F0280 Dementia in other diseases classified elsewhere without behavioral disturbance: Secondary | ICD-10-CM

## 2016-11-11 DIAGNOSIS — F329 Major depressive disorder, single episode, unspecified: Secondary | ICD-10-CM | POA: Diagnosis not present

## 2016-11-11 DIAGNOSIS — G308 Other Alzheimer's disease: Secondary | ICD-10-CM | POA: Diagnosis not present

## 2016-11-11 DIAGNOSIS — Z0000 Encounter for general adult medical examination without abnormal findings: Secondary | ICD-10-CM | POA: Diagnosis not present

## 2016-11-11 DIAGNOSIS — E7800 Pure hypercholesterolemia, unspecified: Secondary | ICD-10-CM | POA: Diagnosis not present

## 2016-11-11 DIAGNOSIS — M810 Age-related osteoporosis without current pathological fracture: Secondary | ICD-10-CM

## 2016-11-11 DIAGNOSIS — K219 Gastro-esophageal reflux disease without esophagitis: Secondary | ICD-10-CM | POA: Diagnosis not present

## 2016-11-11 DIAGNOSIS — I10 Essential (primary) hypertension: Secondary | ICD-10-CM | POA: Diagnosis not present

## 2016-11-11 LAB — LIPID PANEL
CHOL/HDL RATIO: 4
Cholesterol: 170 mg/dL (ref 0–200)
HDL: 42 mg/dL (ref >39.00)
LDL CALC: 91 mg/dL (ref 0–99)
NonHDL: 127.62
Triglycerides: 183 mg/dL — ABNORMAL HIGH (ref 0.0–149.0)
VLDL: 36.6 mg/dL (ref 0.0–40.0)

## 2016-11-11 LAB — COMPREHENSIVE METABOLIC PANEL
ALT: 9 U/L (ref 0–35)
AST: 15 U/L (ref 0–37)
Albumin: 4.2 g/dL (ref 3.5–5.2)
Alkaline Phosphatase: 81 U/L (ref 39–117)
BUN: 15 mg/dL (ref 6–23)
CO2: 32 meq/L (ref 19–32)
CREATININE: 0.89 mg/dL (ref 0.40–1.20)
Calcium: 9.3 mg/dL (ref 8.4–10.5)
Chloride: 104 mEq/L (ref 96–112)
GFR: 65.21 mL/min (ref >60.00)
GLUCOSE: 158 mg/dL — AB (ref 70–99)
POTASSIUM: 4.1 meq/L (ref 3.5–5.1)
SODIUM: 140 meq/L (ref 135–145)
Total Bilirubin: 0.6 mg/dL (ref 0.2–1.2)
Total Protein: 6.9 g/dL (ref 6.0–8.3)

## 2016-11-11 LAB — CBC
HCT: 41.6 % (ref 36.0–46.0)
Hemoglobin: 13.3 g/dL (ref 12.0–15.0)
MCHC: 31.9 g/dL (ref 30.0–36.0)
MCV: 84.4 fl (ref 78.0–100.0)
Platelets: 334 10*3/uL (ref 150.0–400.0)
RBC: 4.93 Mil/uL (ref 3.87–5.11)
RDW: 15.6 % — AB (ref 11.5–15.5)
WBC: 8.9 10*3/uL (ref 4.0–10.5)

## 2016-11-11 LAB — VITAMIN D 25 HYDROXY (VIT D DEFICIENCY, FRACTURES): VITD: 29.86 ng/mL — AB (ref 30.00–100.00)

## 2016-11-11 NOTE — Assessment & Plan Note (Signed)
Encouraged her to consume a low fat diet CMET and Lipid profile today Continue Simvastatin

## 2016-11-11 NOTE — Assessment & Plan Note (Signed)
Controlled on Amlodipine CMET today 

## 2016-11-11 NOTE — Assessment & Plan Note (Signed)
Continue Boniva Discussed getting 30 minutes of weight bearing exercise daily Bone density due 2019

## 2016-11-11 NOTE — Assessment & Plan Note (Signed)
Moderate cognitive impairment, stable functional status Continue Aricept and Namenda They will continue to follow with Dr. Melrose Nakayama

## 2016-11-11 NOTE — Assessment & Plan Note (Signed)
Controlled on Prilosec CMET today

## 2016-11-11 NOTE — Progress Notes (Signed)
HPI:  Pt presents to the clinic today for her Medicare Wellness Exam. She is also due to follow up chronic conditions.  HTN: Her BP today is 138/72. She is taking Amlodipine daily as prescribed. ECG from 06/2016 reviewed.  GERD: Triggered by spicy foods and tomato based foods.. She is taking Omeprazole BID as prescribed. She denies breakthrough symptoms.  HLD: Her last LDL was 110, 10/2015. She is taking Simvastatin as prescribed. She denies myalgias. She tries to consume a low fat diet.  Alzheimer's: Mainly effect short term memory. She is still independent with ADL's. She is taking Aricept and Namenda as prescribed. She has been started on Zoloft for her mood. She denies anxiety or depression. She follows with Dr. Melrose Nakayama.  Osteoporosis: Bone density from 11/2015 reviewed. She is taking Boniva as prescribed.   Past Medical History:  Diagnosis Date  . Alzheimer disease   . Depression   . GERD (gastroesophageal reflux disease)   . Hyperlipidemia   . Hypertension     Current Outpatient Prescriptions  Medication Sig Dispense Refill  . acetaminophen (TYLENOL) 325 MG tablet Take 2 tablets (650 mg total) by mouth every 6 (six) hours as needed for mild pain.    Marland Kitchen amLODipine (NORVASC) 5 MG tablet Take 1 tablet (5 mg total) by mouth daily. 90 tablet 0  . aspirin 81 MG tablet Take 81 mg by mouth daily.    . cholecalciferol (VITAMIN D) 1000 units tablet Take 1,000 Units by mouth daily.    Marland Kitchen donepezil (ARICEPT) 10 MG tablet TAKE 1 TABLET AT BEDTIME 90 tablet 0  . ibandronate (BONIVA) 150 MG tablet Take 1 tablet (150 mg total) by mouth every 30 (thirty) days. Take in the morning with a full glass of water, on an empty stomach, and do not take anything else by mouth or lie down for the next 30 min. 3 tablet 4  . Melatonin 5 MG TABS Take 1 tablet by mouth at bedtime and may repeat dose one time if needed.     . memantine (NAMENDA) 5 MG tablet TAKE 1 TABLET TWICE DAILY 180 tablet 0  . memantine  (NAMENDA) 5 MG tablet Take 1 tablet (5 mg total) by mouth 2 (two) times daily. 14 tablet 0  . oxyCODONE (OXY IR/ROXICODONE) 5 MG immediate release tablet Take 1 tablet (5 mg total) by mouth every 6 (six) hours as needed for moderate pain or severe pain. 20 tablet 0  . pantoprazole (PROTONIX) 40 MG tablet TAKE 1 TABLET TWICE DAILY 180 tablet 0  . sertraline (ZOLOFT) 50 MG tablet TAKE 1 TABLET EVERY DAY 90 tablet 0  . simvastatin (ZOCOR) 40 MG tablet TAKE 1 TABLET DAILY AT 6 PM 90 tablet 1  . vitamin B-12 (CYANOCOBALAMIN) 1000 MCG tablet Take 1,000 mcg by mouth daily.    . vitamin E 400 UNIT capsule Take 400 Units by mouth daily.     No current facility-administered medications for this visit.     Allergies  Allergen Reactions  . Lisinopril Cough    Family History  Problem Relation Age of Onset  . Heart disease Mother   . Hypertension Mother   . Arthritis Father   . Cancer Father        Prostate  . Cancer Son        Lung  . Cancer Sister        Breast  . Arthritis Sister   . Cancer Brother        Throat  .  Hypertension Daughter   . Diabetes Paternal Aunt   . Diabetes Paternal Uncle   . Diabetes Paternal Grandfather   . Cancer Sister        Breast  . Arthritis Sister   . Cancer Sister        Breast  . Arthritis Sister   . Cancer Brother        Prostate  . Diabetes Daughter        Uterine  . Stroke Neg Hx     Social History   Social History  . Marital status: Widowed    Spouse name: N/A  . Number of children: N/A  . Years of education: N/A   Occupational History  . Not on file.   Social History Main Topics  . Smoking status: Never Smoker  . Smokeless tobacco: Never Used  . Alcohol use No  . Drug use: No  . Sexual activity: Not on file   Other Topics Concern  . Not on file   Social History Narrative  . No narrative on file    Hospitiliaztions: 06/2016, patellar fracture  Health Maintenance:    Flu: 02/2016  Tetanus: 06/2016  Pneumovax: ?  2014  Prevnar: 07/2013  Zostavax: never  Mammogram: 12/2015  Pap Smear: no longer screening  Bone Density: 11/2015  Colon Screening: 2011  Eye Doctor: as needed  Dental Exam: dentures   Providers:   PCP: Webb Silversmith, NP-C  Neurologist: Dr. Melrose Nakayama  Cardiologist:  ENT:  Gastroenterologist:  Pulmonologist:   I have personally reviewed and have noted:  1. The patient's medical and social history 2. Their use of alcohol, tobacco or illicit drugs 3. Their current medications and supplements 4. The patient's functional ability including ADL's, fall risks, home safety risks and hearing or visual impairment. 5. Diet and physical activities 6. Evidence for depression or mood disorder  Subjective:   Review of Systems:   Constitutional: Denies fever, malaise, fatigue, headache or abrupt weight changes.  HEENT: Denies eye pain, eye redness, ear pain, ringing in the ears, wax buildup, runny nose, nasal congestion, bloody nose, or sore throat. Respiratory: Denies difficulty breathing, shortness of breath, cough or sputum production.   Cardiovascular: Denies chest pain, chest tightness, palpitations or swelling in the hands or feet.  Gastrointestinal: Denies abdominal pain, bloating, constipation, diarrhea or blood in the stool.  GU: Denies urgency, frequency, pain with urination, burning sensation, blood in urine, odor or discharge. Musculoskeletal: Pt reports intermittent neck pain. Denies decrease in range of motion, difficulty with gait, muscle pain or joint swelling.  Skin: Denies redness, rashes, lesions or ulcercations.  Neurological: Pt reports difficulty with memory. Denies dizziness, difficulty with speech or problems with balance and coordination.  Psych: Denies anxiety, depression, SI/HI.  No other specific complaints in a complete review of systems (except as listed in HPI above).  Objective:  PE:   BP 138/72   Pulse 65   Temp 98 F (36.7 C) (Oral)   Ht 5\' 9"  (1.753  m)   Wt 175 lb (79.4 kg)   SpO2 97%   BMI 25.84 kg/m   Wt Readings from Last 3 Encounters:  08/31/16 176 lb (79.8 kg)  08/18/16 176 lb (79.8 kg)  07/12/16 188 lb 0.8 oz (85.3 kg)    General: Appears her stated age, in NAD. Skin: Warm, dry and intact. Bruise noted on right anterior forehead and right forearm. Cardiovascular: Normal rate and rhythm. S1,S2 noted.  No murmur, rubs or gallops noted. No JVD or  BLE edema. No carotid bruits noted. Pulmonary/Chest: Normal effort and positive vesicular breath sounds. No respiratory distress. No wheezes, rales or ronchi noted.  Abdomen: Soft and nontender. Normal bowel sounds. No distention or masses noted.  Musculoskeletal: Strength 5/5 BUE/BLE.  Neurological: Alert and oriented. Psychiatric: Mood and affect normal. Behavior is normal. Judgment and thought content normal.    BMET    Component Value Date/Time   NA 140 07/13/2016 0316   K 3.9 07/13/2016 0316   CL 106 07/13/2016 0316   CO2 28 07/13/2016 0316   GLUCOSE 118 (H) 07/13/2016 0316   BUN 12 07/13/2016 0316   CREATININE 0.90 07/13/2016 0316   CALCIUM 8.7 (L) 07/13/2016 0316   GFRNONAA >60 07/13/2016 0316   GFRAA >60 07/13/2016 0316    Lipid Panel     Component Value Date/Time   CHOL 190 11/06/2015 1603   TRIG 170.0 (H) 11/06/2015 1603   HDL 45.90 11/06/2015 1603   CHOLHDL 4 11/06/2015 1603   VLDL 34.0 11/06/2015 1603   LDLCALC 110 (H) 11/06/2015 1603    CBC    Component Value Date/Time   WBC 10.0 07/13/2016 0316   RBC 4.29 07/13/2016 0316   HGB 11.5 (L) 07/13/2016 0316   HCT 36.0 07/13/2016 0316   PLT 280 07/13/2016 0316   MCV 83.9 07/13/2016 0316   MCH 26.8 07/13/2016 0316   MCHC 31.9 07/13/2016 0316   RDW 16.0 (H) 07/13/2016 0316   LYMPHSABS 2.5 07/11/2016 2127   MONOABS 0.6 07/11/2016 2127   EOSABS 0.0 07/11/2016 2127   BASOSABS 0.0 07/11/2016 2127    Hgb A1C No results found for: HGBA1C    Assessment and Plan:   Medicare Annual Wellness  Visit:  Diet: She does eat meat. She consumes fruits and veggies. She tries to avoid fried foods. She drinks coffee, soda and water. Physical activity: Walking Depression/mood screen: Negative Hearing: Intact to whispered voice Visual acuity: Grossly normal ADLs: Capable Fall risk: High Home safety: Good Cognitive evaluation: Issues with short term memory, known dementia EOL planning: Adv directives, full code/ I agree  Preventative Medicine: Encouraged her to get a flu shot in the fall. Tetanus, pneumovax and prevnar UTD. She declines shingles vaccine. Mammogram, bone density and colonoscopy UTD. She declines cervical cancer screening. Encouraged her to consume a balanced diet and exercise regimen. Advised her to see an eye doctor annually. Will check CBC, CMET, Lipid and Vit D today.   Next appointment: 1 year, Medicare Wellness Exam   Webb Silversmith, NP

## 2016-11-11 NOTE — Patient Instructions (Signed)
Health Maintenance for Postmenopausal Women Menopause is a normal process in which your reproductive ability comes to an end. This process happens gradually over a span of months to years, usually between the ages of 22 and 9. Menopause is complete when you have missed 12 consecutive menstrual periods. It is important to talk with your health care provider about some of the most common conditions that affect postmenopausal women, such as heart disease, cancer, and bone loss (osteoporosis). Adopting a healthy lifestyle and getting preventive care can help to promote your health and wellness. Those actions can also lower your chances of developing some of these common conditions. What should I know about menopause? During menopause, you may experience a number of symptoms, such as:  Moderate-to-severe hot flashes.  Night sweats.  Decrease in sex drive.  Mood swings.  Headaches.  Tiredness.  Irritability.  Memory problems.  Insomnia.  Choosing to treat or not to treat menopausal changes is an individual decision that you make with your health care provider. What should I know about hormone replacement therapy and supplements? Hormone therapy products are effective for treating symptoms that are associated with menopause, such as hot flashes and night sweats. Hormone replacement carries certain risks, especially as you become older. If you are thinking about using estrogen or estrogen with progestin treatments, discuss the benefits and risks with your health care provider. What should I know about heart disease and stroke? Heart disease, heart attack, and stroke become more likely as you age. This may be due, in part, to the hormonal changes that your body experiences during menopause. These can affect how your body processes dietary fats, triglycerides, and cholesterol. Heart attack and stroke are both medical emergencies. There are many things that you can do to help prevent heart disease  and stroke:  Have your blood pressure checked at least every 1-2 years. High blood pressure causes heart disease and increases the risk of stroke.  If you are 53-22 years old, ask your health care provider if you should take aspirin to prevent a heart attack or a stroke.  Do not use any tobacco products, including cigarettes, chewing tobacco, or electronic cigarettes. If you need help quitting, ask your health care provider.  It is important to eat a healthy diet and maintain a healthy weight. ? Be sure to include plenty of vegetables, fruits, low-fat dairy products, and lean protein. ? Avoid eating foods that are high in solid fats, added sugars, or salt (sodium).  Get regular exercise. This is one of the most important things that you can do for your health. ? Try to exercise for at least 150 minutes each week. The type of exercise that you do should increase your heart rate and make you sweat. This is known as moderate-intensity exercise. ? Try to do strengthening exercises at least twice each week. Do these in addition to the moderate-intensity exercise.  Know your numbers.Ask your health care provider to check your cholesterol and your blood glucose. Continue to have your blood tested as directed by your health care provider.  What should I know about cancer screening? There are several types of cancer. Take the following steps to reduce your risk and to catch any cancer development as early as possible. Breast Cancer  Practice breast self-awareness. ? This means understanding how your breasts normally appear and feel. ? It also means doing regular breast self-exams. Let your health care provider know about any changes, no matter how small.  If you are 40  or older, have a clinician do a breast exam (clinical breast exam or CBE) every year. Depending on your age, family history, and medical history, it may be recommended that you also have a yearly breast X-ray (mammogram).  If you  have a family history of breast cancer, talk with your health care provider about genetic screening.  If you are at high risk for breast cancer, talk with your health care provider about having an MRI and a mammogram every year.  Breast cancer (BRCA) gene test is recommended for women who have family members with BRCA-related cancers. Results of the assessment will determine the need for genetic counseling and BRCA1 and for BRCA2 testing. BRCA-related cancers include these types: ? Breast. This occurs in males or females. ? Ovarian. ? Tubal. This may also be called fallopian tube cancer. ? Cancer of the abdominal or pelvic lining (peritoneal cancer). ? Prostate. ? Pancreatic.  Cervical, Uterine, and Ovarian Cancer Your health care provider may recommend that you be screened regularly for cancer of the pelvic organs. These include your ovaries, uterus, and vagina. This screening involves a pelvic exam, which includes checking for microscopic changes to the surface of your cervix (Pap test).  For women ages 21-65, health care providers may recommend a pelvic exam and a Pap test every three years. For women ages 79-65, they may recommend the Pap test and pelvic exam, combined with testing for human papilloma virus (HPV), every five years. Some types of HPV increase your risk of cervical cancer. Testing for HPV may also be done on women of any age who have unclear Pap test results.  Other health care providers may not recommend any screening for nonpregnant women who are considered low risk for pelvic cancer and have no symptoms. Ask your health care provider if a screening pelvic exam is right for you.  If you have had past treatment for cervical cancer or a condition that could lead to cancer, you need Pap tests and screening for cancer for at least 20 years after your treatment. If Pap tests have been discontinued for you, your risk factors (such as having a new sexual partner) need to be  reassessed to determine if you should start having screenings again. Some women have medical problems that increase the chance of getting cervical cancer. In these cases, your health care provider may recommend that you have screening and Pap tests more often.  If you have a family history of uterine cancer or ovarian cancer, talk with your health care provider about genetic screening.  If you have vaginal bleeding after reaching menopause, tell your health care provider.  There are currently no reliable tests available to screen for ovarian cancer.  Lung Cancer Lung cancer screening is recommended for adults 69-62 years old who are at high risk for lung cancer because of a history of smoking. A yearly low-dose CT scan of the lungs is recommended if you:  Currently smoke.  Have a history of at least 30 pack-years of smoking and you currently smoke or have quit within the past 15 years. A pack-year is smoking an average of one pack of cigarettes per day for one year.  Yearly screening should:  Continue until it has been 15 years since you quit.  Stop if you develop a health problem that would prevent you from having lung cancer treatment.  Colorectal Cancer  This type of cancer can be detected and can often be prevented.  Routine colorectal cancer screening usually begins at  age 42 and continues through age 45.  If you have risk factors for colon cancer, your health care provider may recommend that you be screened at an earlier age.  If you have a family history of colorectal cancer, talk with your health care provider about genetic screening.  Your health care provider may also recommend using home test kits to check for hidden blood in your stool.  A small camera at the end of a tube can be used to examine your colon directly (sigmoidoscopy or colonoscopy). This is done to check for the earliest forms of colorectal cancer.  Direct examination of the colon should be repeated every  5-10 years until age 71. However, if early forms of precancerous polyps or small growths are found or if you have a family history or genetic risk for colorectal cancer, you may need to be screened more often.  Skin Cancer  Check your skin from head to toe regularly.  Monitor any moles. Be sure to tell your health care provider: ? About any new moles or changes in moles, especially if there is a change in a mole's shape or color. ? If you have a mole that is larger than the size of a pencil eraser.  If any of your family members has a history of skin cancer, especially at a young age, talk with your health care provider about genetic screening.  Always use sunscreen. Apply sunscreen liberally and repeatedly throughout the day.  Whenever you are outside, protect yourself by wearing long sleeves, pants, a wide-brimmed hat, and sunglasses.  What should I know about osteoporosis? Osteoporosis is a condition in which bone destruction happens more quickly than new bone creation. After menopause, you may be at an increased risk for osteoporosis. To help prevent osteoporosis or the bone fractures that can happen because of osteoporosis, the following is recommended:  If you are 46-71 years old, get at least 1,000 mg of calcium and at least 600 mg of vitamin D per day.  If you are older than age 55 but younger than age 65, get at least 1,200 mg of calcium and at least 600 mg of vitamin D per day.  If you are older than age 54, get at least 1,200 mg of calcium and at least 800 mg of vitamin D per day.  Smoking and excessive alcohol intake increase the risk of osteoporosis. Eat foods that are rich in calcium and vitamin D, and do weight-bearing exercises several times each week as directed by your health care provider. What should I know about how menopause affects my mental health? Depression may occur at any age, but it is more common as you become older. Common symptoms of depression  include:  Low or sad mood.  Changes in sleep patterns.  Changes in appetite or eating patterns.  Feeling an overall lack of motivation or enjoyment of activities that you previously enjoyed.  Frequent crying spells.  Talk with your health care provider if you think that you are experiencing depression. What should I know about immunizations? It is important that you get and maintain your immunizations. These include:  Tetanus, diphtheria, and pertussis (Tdap) booster vaccine.  Influenza every year before the flu season begins.  Pneumonia vaccine.  Shingles vaccine.  Your health care provider may also recommend other immunizations. This information is not intended to replace advice given to you by your health care provider. Make sure you discuss any questions you have with your health care provider. Document Released: 04/24/2005  Document Revised: 09/20/2015 Document Reviewed: 12/04/2014 Elsevier Interactive Patient Education  2018 [employer]  

## 2016-11-11 NOTE — Assessment & Plan Note (Signed)
Managed on Zoloft Will monitor

## 2016-11-22 ENCOUNTER — Other Ambulatory Visit: Payer: Self-pay | Admitting: Internal Medicine

## 2016-12-16 DIAGNOSIS — R413 Other amnesia: Secondary | ICD-10-CM | POA: Diagnosis not present

## 2016-12-16 DIAGNOSIS — E538 Deficiency of other specified B group vitamins: Secondary | ICD-10-CM | POA: Diagnosis not present

## 2017-01-06 ENCOUNTER — Other Ambulatory Visit: Payer: Self-pay | Admitting: Internal Medicine

## 2017-01-07 NOTE — Telephone Encounter (Signed)
Last refill 01/07/16 # 3+ 4  Last OV  11/11/16  Ok to refill?

## 2017-04-25 ENCOUNTER — Other Ambulatory Visit: Payer: Self-pay | Admitting: Internal Medicine

## 2017-06-09 DIAGNOSIS — H524 Presbyopia: Secondary | ICD-10-CM | POA: Diagnosis not present

## 2017-06-09 DIAGNOSIS — Z0100 Encounter for examination of eyes and vision without abnormal findings: Secondary | ICD-10-CM | POA: Diagnosis not present

## 2017-06-16 DIAGNOSIS — F0281 Dementia in other diseases classified elsewhere with behavioral disturbance: Secondary | ICD-10-CM | POA: Diagnosis not present

## 2017-06-16 DIAGNOSIS — G309 Alzheimer's disease, unspecified: Secondary | ICD-10-CM | POA: Diagnosis not present

## 2017-10-18 ENCOUNTER — Other Ambulatory Visit: Payer: Self-pay | Admitting: Internal Medicine

## 2017-11-12 ENCOUNTER — Other Ambulatory Visit: Payer: Self-pay | Admitting: Internal Medicine

## 2017-11-17 ENCOUNTER — Ambulatory Visit (INDEPENDENT_AMBULATORY_CARE_PROVIDER_SITE_OTHER): Payer: Medicare HMO | Admitting: Internal Medicine

## 2017-11-17 ENCOUNTER — Encounter: Payer: Self-pay | Admitting: Internal Medicine

## 2017-11-17 VITALS — BP 134/86 | HR 55 | Temp 98.4°F | Ht 69.0 in | Wt 176.0 lb

## 2017-11-17 DIAGNOSIS — Z23 Encounter for immunization: Secondary | ICD-10-CM | POA: Diagnosis not present

## 2017-11-17 DIAGNOSIS — E7800 Pure hypercholesterolemia, unspecified: Secondary | ICD-10-CM | POA: Diagnosis not present

## 2017-11-17 DIAGNOSIS — F329 Major depressive disorder, single episode, unspecified: Secondary | ICD-10-CM

## 2017-11-17 DIAGNOSIS — I10 Essential (primary) hypertension: Secondary | ICD-10-CM | POA: Diagnosis not present

## 2017-11-17 DIAGNOSIS — M810 Age-related osteoporosis without current pathological fracture: Secondary | ICD-10-CM

## 2017-11-17 DIAGNOSIS — K219 Gastro-esophageal reflux disease without esophagitis: Secondary | ICD-10-CM

## 2017-11-17 DIAGNOSIS — Z0000 Encounter for general adult medical examination without abnormal findings: Secondary | ICD-10-CM | POA: Diagnosis not present

## 2017-11-17 DIAGNOSIS — G308 Other Alzheimer's disease: Secondary | ICD-10-CM | POA: Diagnosis not present

## 2017-11-17 DIAGNOSIS — F0281 Dementia in other diseases classified elsewhere with behavioral disturbance: Secondary | ICD-10-CM

## 2017-11-17 DIAGNOSIS — Z1231 Encounter for screening mammogram for malignant neoplasm of breast: Secondary | ICD-10-CM | POA: Diagnosis not present

## 2017-11-17 DIAGNOSIS — Z1239 Encounter for other screening for malignant neoplasm of breast: Secondary | ICD-10-CM

## 2017-11-17 DIAGNOSIS — F02818 Dementia in other diseases classified elsewhere, unspecified severity, with other behavioral disturbance: Secondary | ICD-10-CM

## 2017-11-17 LAB — CBC
HEMATOCRIT: 42.7 % (ref 36.0–46.0)
HEMOGLOBIN: 13.9 g/dL (ref 12.0–15.0)
MCHC: 32.6 g/dL (ref 30.0–36.0)
MCV: 86.2 fl (ref 78.0–100.0)
PLATELETS: 304 10*3/uL (ref 150.0–400.0)
RBC: 4.95 Mil/uL (ref 3.87–5.11)
RDW: 15.6 % — AB (ref 11.5–15.5)
WBC: 9.3 10*3/uL (ref 4.0–10.5)

## 2017-11-17 LAB — COMPREHENSIVE METABOLIC PANEL
ALBUMIN: 4.4 g/dL (ref 3.5–5.2)
ALK PHOS: 56 U/L (ref 39–117)
ALT: 9 U/L (ref 0–35)
AST: 16 U/L (ref 0–37)
BILIRUBIN TOTAL: 0.5 mg/dL (ref 0.2–1.2)
BUN: 19 mg/dL (ref 6–23)
CO2: 34 mEq/L — ABNORMAL HIGH (ref 19–32)
Calcium: 9.5 mg/dL (ref 8.4–10.5)
Chloride: 102 mEq/L (ref 96–112)
Creatinine, Ser: 1.02 mg/dL (ref 0.40–1.20)
GFR: 55.57 mL/min — AB (ref >60.00)
GLUCOSE: 96 mg/dL (ref 70–99)
POTASSIUM: 5 meq/L (ref 3.5–5.1)
SODIUM: 141 meq/L (ref 135–145)
TOTAL PROTEIN: 6.9 g/dL (ref 6.0–8.3)

## 2017-11-17 LAB — LIPID PANEL
Cholesterol: 170 mg/dL (ref 0–200)
HDL: 45.1 mg/dL (ref >39.00)
NonHDL: 125.26
Total CHOL/HDL Ratio: 4
Triglycerides: 258 mg/dL — ABNORMAL HIGH (ref 0.0–149.0)
VLDL: 51.6 mg/dL — AB (ref 0.0–40.0)

## 2017-11-17 LAB — LDL CHOLESTEROL, DIRECT: LDL DIRECT: 104 mg/dL

## 2017-11-17 LAB — VITAMIN D 25 HYDROXY (VIT D DEFICIENCY, FRACTURES): VITD: 55.25 ng/mL (ref 30.00–100.00)

## 2017-11-17 NOTE — Patient Instructions (Signed)
Health Maintenance for Postmenopausal Women Menopause is a normal process in which your reproductive ability comes to an end. This process happens gradually over a span of months to years, usually between the ages of 22 and 9. Menopause is complete when you have missed 12 consecutive menstrual periods. It is important to talk with your health care provider about some of the most common conditions that affect postmenopausal women, such as heart disease, cancer, and bone loss (osteoporosis). Adopting a healthy lifestyle and getting preventive care can help to promote your health and wellness. Those actions can also lower your chances of developing some of these common conditions. What should I know about menopause? During menopause, you may experience a number of symptoms, such as:  Moderate-to-severe hot flashes.  Night sweats.  Decrease in sex drive.  Mood swings.  Headaches.  Tiredness.  Irritability.  Memory problems.  Insomnia.  Choosing to treat or not to treat menopausal changes is an individual decision that you make with your health care provider. What should I know about hormone replacement therapy and supplements? Hormone therapy products are effective for treating symptoms that are associated with menopause, such as hot flashes and night sweats. Hormone replacement carries certain risks, especially as you become older. If you are thinking about using estrogen or estrogen with progestin treatments, discuss the benefits and risks with your health care provider. What should I know about heart disease and stroke? Heart disease, heart attack, and stroke become more likely as you age. This may be due, in part, to the hormonal changes that your body experiences during menopause. These can affect how your body processes dietary fats, triglycerides, and cholesterol. Heart attack and stroke are both medical emergencies. There are many things that you can do to help prevent heart disease  and stroke:  Have your blood pressure checked at least every 1-2 years. High blood pressure causes heart disease and increases the risk of stroke.  If you are 53-22 years old, ask your health care provider if you should take aspirin to prevent a heart attack or a stroke.  Do not use any tobacco products, including cigarettes, chewing tobacco, or electronic cigarettes. If you need help quitting, ask your health care provider.  It is important to eat a healthy diet and maintain a healthy weight. ? Be sure to include plenty of vegetables, fruits, low-fat dairy products, and lean protein. ? Avoid eating foods that are high in solid fats, added sugars, or salt (sodium).  Get regular exercise. This is one of the most important things that you can do for your health. ? Try to exercise for at least 150 minutes each week. The type of exercise that you do should increase your heart rate and make you sweat. This is known as moderate-intensity exercise. ? Try to do strengthening exercises at least twice each week. Do these in addition to the moderate-intensity exercise.  Know your numbers.Ask your health care provider to check your cholesterol and your blood glucose. Continue to have your blood tested as directed by your health care provider.  What should I know about cancer screening? There are several types of cancer. Take the following steps to reduce your risk and to catch any cancer development as early as possible. Breast Cancer  Practice breast self-awareness. ? This means understanding how your breasts normally appear and feel. ? It also means doing regular breast self-exams. Let your health care provider know about any changes, no matter how small.  If you are 40  or older, have a clinician do a breast exam (clinical breast exam or CBE) every year. Depending on your age, family history, and medical history, it may be recommended that you also have a yearly breast X-ray (mammogram).  If you  have a family history of breast cancer, talk with your health care provider about genetic screening.  If you are at high risk for breast cancer, talk with your health care provider about having an MRI and a mammogram every year.  Breast cancer (BRCA) gene test is recommended for women who have family members with BRCA-related cancers. Results of the assessment will determine the need for genetic counseling and BRCA1 and for BRCA2 testing. BRCA-related cancers include these types: ? Breast. This occurs in males or females. ? Ovarian. ? Tubal. This may also be called fallopian tube cancer. ? Cancer of the abdominal or pelvic lining (peritoneal cancer). ? Prostate. ? Pancreatic.  Cervical, Uterine, and Ovarian Cancer Your health care provider may recommend that you be screened regularly for cancer of the pelvic organs. These include your ovaries, uterus, and vagina. This screening involves a pelvic exam, which includes checking for microscopic changes to the surface of your cervix (Pap test).  For women ages 21-65, health care providers may recommend a pelvic exam and a Pap test every three years. For women ages 79-65, they may recommend the Pap test and pelvic exam, combined with testing for human papilloma virus (HPV), every five years. Some types of HPV increase your risk of cervical cancer. Testing for HPV may also be done on women of any age who have unclear Pap test results.  Other health care providers may not recommend any screening for nonpregnant women who are considered low risk for pelvic cancer and have no symptoms. Ask your health care provider if a screening pelvic exam is right for you.  If you have had past treatment for cervical cancer or a condition that could lead to cancer, you need Pap tests and screening for cancer for at least 20 years after your treatment. If Pap tests have been discontinued for you, your risk factors (such as having a new sexual partner) need to be  reassessed to determine if you should start having screenings again. Some women have medical problems that increase the chance of getting cervical cancer. In these cases, your health care provider may recommend that you have screening and Pap tests more often.  If you have a family history of uterine cancer or ovarian cancer, talk with your health care provider about genetic screening.  If you have vaginal bleeding after reaching menopause, tell your health care provider.  There are currently no reliable tests available to screen for ovarian cancer.  Lung Cancer Lung cancer screening is recommended for adults 69-62 years old who are at high risk for lung cancer because of a history of smoking. A yearly low-dose CT scan of the lungs is recommended if you:  Currently smoke.  Have a history of at least 30 pack-years of smoking and you currently smoke or have quit within the past 15 years. A pack-year is smoking an average of one pack of cigarettes per day for one year.  Yearly screening should:  Continue until it has been 15 years since you quit.  Stop if you develop a health problem that would prevent you from having lung cancer treatment.  Colorectal Cancer  This type of cancer can be detected and can often be prevented.  Routine colorectal cancer screening usually begins at  age 42 and continues through age 45.  If you have risk factors for colon cancer, your health care provider may recommend that you be screened at an earlier age.  If you have a family history of colorectal cancer, talk with your health care provider about genetic screening.  Your health care provider may also recommend using home test kits to check for hidden blood in your stool.  A small camera at the end of a tube can be used to examine your colon directly (sigmoidoscopy or colonoscopy). This is done to check for the earliest forms of colorectal cancer.  Direct examination of the colon should be repeated every  5-10 years until age 71. However, if early forms of precancerous polyps or small growths are found or if you have a family history or genetic risk for colorectal cancer, you may need to be screened more often.  Skin Cancer  Check your skin from head to toe regularly.  Monitor any moles. Be sure to tell your health care provider: ? About any new moles or changes in moles, especially if there is a change in a mole's shape or color. ? If you have a mole that is larger than the size of a pencil eraser.  If any of your family members has a history of skin cancer, especially at a young age, talk with your health care provider about genetic screening.  Always use sunscreen. Apply sunscreen liberally and repeatedly throughout the day.  Whenever you are outside, protect yourself by wearing long sleeves, pants, a wide-brimmed hat, and sunglasses.  What should I know about osteoporosis? Osteoporosis is a condition in which bone destruction happens more quickly than new bone creation. After menopause, you may be at an increased risk for osteoporosis. To help prevent osteoporosis or the bone fractures that can happen because of osteoporosis, the following is recommended:  If you are 46-71 years old, get at least 1,000 mg of calcium and at least 600 mg of vitamin D per day.  If you are older than age 55 but younger than age 65, get at least 1,200 mg of calcium and at least 600 mg of vitamin D per day.  If you are older than age 54, get at least 1,200 mg of calcium and at least 800 mg of vitamin D per day.  Smoking and excessive alcohol intake increase the risk of osteoporosis. Eat foods that are rich in calcium and vitamin D, and do weight-bearing exercises several times each week as directed by your health care provider. What should I know about how menopause affects my mental health? Depression may occur at any age, but it is more common as you become older. Common symptoms of depression  include:  Low or sad mood.  Changes in sleep patterns.  Changes in appetite or eating patterns.  Feeling an overall lack of motivation or enjoyment of activities that you previously enjoyed.  Frequent crying spells.  Talk with your health care provider if you think that you are experiencing depression. What should I know about immunizations? It is important that you get and maintain your immunizations. These include:  Tetanus, diphtheria, and pertussis (Tdap) booster vaccine.  Influenza every year before the flu season begins.  Pneumonia vaccine.  Shingles vaccine.  Your health care provider may also recommend other immunizations. This information is not intended to replace advice given to you by your health care provider. Make sure you discuss any questions you have with your health care provider. Document Released: 04/24/2005  Document Revised: 09/20/2015 Document Reviewed: 12/04/2014 Elsevier Interactive Patient Education  2018 [employer]  

## 2017-11-17 NOTE — Assessment & Plan Note (Signed)
Continue Boniva, Calcium and Vit D Bone density ordered

## 2017-11-17 NOTE — Assessment & Plan Note (Signed)
Continue Amlodipine Reinforced DASH diet CBC and CMET today

## 2017-11-17 NOTE — Assessment & Plan Note (Signed)
Stable on Sertraline and Depakote Support offered today

## 2017-11-17 NOTE — Assessment & Plan Note (Signed)
Still living at home Does not need help with ADL's other than med management Continue Aricept and Namenda Continue to follow with neurology

## 2017-11-17 NOTE — Progress Notes (Signed)
HPI:  Pt presents to the clinic today for her Medicare Wellness Exam.  Alzheimer's Disease with Behavioral Disturbance: She has short term memory issues. She is taking Depakote, Aricept and Namenda as prescribed.   Depression: Chronic but stable on Sertraline and Depakote. She denies anxiety, SI/HI.  GERD: Chronic but stable on Pantoprazole. She denies breakthrough. Upper GI from 02/2010.  HLD: Her last LDL was 91, 10/2016. She denies myalgias on Simvastatin. She tries to consume a low fat diet.  HTN: Her BP today is 134/86. She is taking Amlodipine as prescribed . ECG from 06/2016 reviewed.  Osteoporosis: She is taking Boniva as prescribed. She is taking Calcium and Vit D as well. Bone density from 11/2015 reviewed.  Past Medical History:  Diagnosis Date  . Alzheimer disease   . Depression   . GERD (gastroesophageal reflux disease)   . Hyperlipidemia   . Hypertension     Current Outpatient Medications  Medication Sig Dispense Refill  . acetaminophen (TYLENOL) 325 MG tablet Take 2 tablets (650 mg total) by mouth every 6 (six) hours as needed for mild pain.    Marland Kitchen amLODipine (NORVASC) 5 MG tablet TAKE 1 TABLET EVERY DAY 90 tablet 0  . aspirin 81 MG tablet Take 81 mg by mouth daily.    . cholecalciferol (VITAMIN D) 1000 units tablet Take 1,000 Units by mouth daily.    Marland Kitchen donepezil (ARICEPT) 10 MG tablet TAKE 1 TABLET AT BEDTIME 90 tablet 1  . ibandronate (BONIVA) 150 MG tablet USE AS DIRECTED BY YOUR PRESCRIBER. COMPLETE DIRECTIONS ARE INCLUDED IN A LETTER WITH YOUR ORIGINAL ORDER 3 tablet 4  . Melatonin 5 MG TABS Take 1 tablet by mouth at bedtime and may repeat dose one time if needed.     . memantine (NAMENDA) 5 MG tablet TAKE 1 TABLET TWICE DAILY 180 tablet 1  . oxyCODONE (OXY IR/ROXICODONE) 5 MG immediate release tablet Take 1 tablet (5 mg total) by mouth every 6 (six) hours as needed for moderate pain or severe pain. 20 tablet 0  . pantoprazole (PROTONIX) 40 MG tablet TAKE 1 TABLET  TWICE DAILY 180 tablet 1  . sertraline (ZOLOFT) 50 MG tablet TAKE 1 TABLET EVERY DAY 90 tablet 0  . simvastatin (ZOCOR) 40 MG tablet TAKE 1 TABLET DAILY AT 6 PM 90 tablet 1  . vitamin B-12 (CYANOCOBALAMIN) 1000 MCG tablet Take 1,000 mcg by mouth daily.    . vitamin E 400 UNIT capsule Take 400 Units by mouth daily.     No current facility-administered medications for this visit.     Allergies  Allergen Reactions  . Lisinopril Cough    Family History  Problem Relation Age of Onset  . Heart disease Mother   . Hypertension Mother   . Arthritis Father   . Cancer Father        Prostate  . Cancer Son        Lung  . Cancer Sister        Breast  . Arthritis Sister   . Cancer Brother        Throat  . Hypertension Daughter   . Diabetes Paternal Aunt   . Diabetes Paternal Uncle   . Diabetes Paternal Grandfather   . Cancer Sister        Breast  . Arthritis Sister   . Cancer Sister        Breast  . Arthritis Sister   . Cancer Brother        Prostate  .  Diabetes Daughter        Uterine  . Stroke Neg Hx     Social History   Socioeconomic History  . Marital status: Widowed    Spouse name: Not on file  . Number of children: Not on file  . Years of education: Not on file  . Highest education level: Not on file  Occupational History  . Not on file  Social Needs  . Financial resource strain: Not on file  . Food insecurity:    Worry: Not on file    Inability: Not on file  . Transportation needs:    Medical: Not on file    Non-medical: Not on file  Tobacco Use  . Smoking status: Never Smoker  . Smokeless tobacco: Never Used  Substance and Sexual Activity  . Alcohol use: No  . Drug use: No  . Sexual activity: Not on file  Lifestyle  . Physical activity:    Days per week: Not on file    Minutes per session: Not on file  . Stress: Not on file  Relationships  . Social connections:    Talks on phone: Not on file    Gets together: Not on file    Attends religious  service: Not on file    Active member of club or organization: Not on file    Attends meetings of clubs or organizations: Not on file    Relationship status: Not on file  . Intimate partner violence:    Fear of current or ex partner: Not on file    Emotionally abused: Not on file    Physically abused: Not on file    Forced sexual activity: Not on file  Other Topics Concern  . Not on file  Social History Narrative  . Not on file    Hospitiliaztions: None  Health Maintenance:    Flu: 02/2016  Tetanus: 06/2016  Pneumovax:? 2014  Prevnar: 07/2013  Zostavax: never  Shingrix: never  Mammogram: 12/2015  Pap Smear: no longer screening  Bone Density: 11/2015  Colon Screening: 2011  Eye Doctor: annually  Dental Exam: as needed   Providers:   PCP: Webb Silversmith, NP-C  Neuorologist: Dr. Melrose Nakayama  Cardiologist: Delos Haring, PA  Orthopedist: Dr. Prescott Parma  Podiatry: Dr. Milinda Pointer    I have personally reviewed and have noted:  1. The patient's medical and social history 2. Their use of alcohol, tobacco or illicit drugs 3. Their current medications and supplements 4. The patient's functional ability including ADL's, fall risks, home safety risks and hearing or visual impairment. 5. Diet and physical activities 6. Evidence for depression or mood disorder  Subjective:   Review of Systems:   Constitutional: Denies fever, malaise, fatigue, headache or abrupt weight changes.  HEENT: Denies eye pain, eye redness, ear pain, ringing in the ears, wax buildup, runny nose, nasal congestion, bloody nose, or sore throat. Respiratory: Denies difficulty breathing, shortness of breath, cough or sputum production.   Cardiovascular: Denies chest pain, chest tightness, palpitations or swelling in the hands or feet.  Gastrointestinal: Pt reports intermittent constipation. Denies abdominal pain, bloating, diarrhea or blood in the stool.  GU: Denies urgency, frequency, pain with urination, burning  sensation, blood in urine, odor or discharge. Musculoskeletal: Pt reports left knee pain. Denies decrease in range of motion, difficulty with gait, muscle pain or joint.  Skin: Denies redness, rashes, lesions or ulcercations.  Neurological: Pt reports difficulty with memory. Denies dizziness, difficulty with speech or problems with balance and coordination.  Psych: Pt reports depression. Denies anxiety, SI/HI.  No other specific complaints in a complete review of systems (except as listed in HPI above).  Objective:  PE:   BP 134/86   Pulse (!) 55   Temp 98.4 F (36.9 C) (Oral)   Ht 5\' 9"  (1.753 m)   Wt 176 lb (79.8 kg)   SpO2 96%   BMI 25.99 kg/m   Wt Readings from Last 3 Encounters:  11/11/16 175 lb (79.4 kg)  08/31/16 176 lb (79.8 kg)  08/18/16 176 lb (79.8 kg)    General: Appears her stated age, well developed, well nourished in NAD. Skin: Warm, dry and intact.  HEENT: Head: normal shape and size; Eyes: sclera white, no icterus, conjunctiva pink, PERRLA and EOMs intact; Ears: Tm's gray and intact, normal light reflex; Throat/Mouth: Teeth present, mucosa pink and moist, no exudate, lesions or ulcerations noted.  Neck: Neck supple, trachea midline. No masses, lumps or thyromegaly present.  Cardiovascular: Normal rate and rhythm. S1,S2 noted.  No murmur, rubs or gallops noted. No JVD or BLE edema. No carotid bruits noted. Pulmonary/Chest: Normal effort and positive vesicular breath sounds. No respiratory distress. No wheezes, rales or ronchi noted.  Abdomen: Soft and nontender. Normal bowel sounds. No distention or masses noted. Liver, spleen and kidneys non palpable. Musculoskeletal: Normal flexion and extension of the left knee. Swelling noted over the medial pes bursa, left knee. Strength 5/5 BUE/BLE. No difficulty with gait. Neurological: Alert and oriented.. Cranial nerves II-XII grossly intact. Coordination normal.  Psychiatric: Mood and affect normal. Behavior is normal.  Judgment and thought content normal.     BMET    Component Value Date/Time   NA 140 11/11/2016 1501   K 4.1 11/11/2016 1501   CL 104 11/11/2016 1501   CO2 32 11/11/2016 1501   GLUCOSE 158 (H) 11/11/2016 1501   BUN 15 11/11/2016 1501   CREATININE 0.89 11/11/2016 1501   CALCIUM 9.3 11/11/2016 1501   GFRNONAA >60 07/13/2016 0316   GFRAA >60 07/13/2016 0316    Lipid Panel     Component Value Date/Time   CHOL 170 11/11/2016 1501   TRIG 183.0 (H) 11/11/2016 1501   HDL 42.00 11/11/2016 1501   CHOLHDL 4 11/11/2016 1501   VLDL 36.6 11/11/2016 1501   LDLCALC 91 11/11/2016 1501    CBC    Component Value Date/Time   WBC 8.9 11/11/2016 1501   RBC 4.93 11/11/2016 1501   HGB 13.3 11/11/2016 1501   HCT 41.6 11/11/2016 1501   PLT 334.0 11/11/2016 1501   MCV 84.4 11/11/2016 1501   MCH 26.8 07/13/2016 0316   MCHC 31.9 11/11/2016 1501   RDW 15.6 (H) 11/11/2016 1501   LYMPHSABS 2.5 07/11/2016 2127   MONOABS 0.6 07/11/2016 2127   EOSABS 0.0 07/11/2016 2127   BASOSABS 0.0 07/11/2016 2127    Hgb A1C No results found for: HGBA1C    Assessment and Plan:   Medicare Annual Wellness Visit:  Diet: She does eat meat. She consumes fruits and veggies daily. She does eat some fried foods. She drinks mostly soda and water. Physical activity: walks in the yard. Depression/mood screen: Chronic on meds. Hearing: Intact to whispered voice Visual acuity: Grossly normal, performs annual eye exam  ADLs: Capable Fall risk: None Home safety: Good Cognitive evaluation: Intact to orientation, naming, recall and repetition EOL planning: No adv directives, full code/ I agree  Preventative Medicine: Flu shot today. Tetanus, pneumovax and prevnar UTD. She declines shingles vaccine. Mammogram and bone  density ordered. She no longer wants to screen for cervical or colon cancer. Encouraged her to consume a balanced diet and exercise regimen. Advised her to see an eye doctor and dentist annually. Will  check CBC, CMET, Lipid and Vit D today/  Next appointment: 1 year, Medicare Wellness Exam  Webb Silversmith, NP

## 2017-11-17 NOTE — Assessment & Plan Note (Signed)
CMET and Lipid profile today Encouraged her to consume a low fat diet Continue Simvastatin for now 

## 2017-11-17 NOTE — Assessment & Plan Note (Signed)
Continue Pantoprazole CBC and CMET today 

## 2017-11-24 MED ORDER — SIMVASTATIN 40 MG PO TABS: ORAL_TABLET | ORAL | 2 refills | Status: DC

## 2017-11-24 MED ORDER — DONEPEZIL HCL 10 MG PO TABS: 10.0000 mg | ORAL_TABLET | Freq: Every day | ORAL | 2 refills | Status: DC

## 2017-11-24 MED ORDER — FENOFIBRATE 54 MG PO TABS: 54.0000 mg | ORAL_TABLET | Freq: Every day | ORAL | 0 refills | Status: DC

## 2017-11-24 MED ORDER — AMLODIPINE BESYLATE 5 MG PO TABS
5.0000 mg | ORAL_TABLET | Freq: Every day | ORAL | 2 refills | Status: DC
Start: 2017-11-24 — End: 2018-08-29

## 2017-11-24 MED ORDER — SERTRALINE HCL 50 MG PO TABS: 50.0000 mg | ORAL_TABLET | Freq: Every day | ORAL | 2 refills | Status: DC

## 2017-11-24 MED ORDER — PANTOPRAZOLE SODIUM 40 MG PO TBEC: 40.0000 mg | DELAYED_RELEASE_TABLET | Freq: Two times a day (BID) | ORAL | 2 refills | Status: DC

## 2017-11-24 NOTE — Addendum Note (Signed)
Addended by: Lurlean Nanny on: 11/24/2017 11:49 AM   Modules accepted: Orders

## 2018-01-19 DIAGNOSIS — G309 Alzheimer's disease, unspecified: Secondary | ICD-10-CM | POA: Diagnosis not present

## 2018-01-19 DIAGNOSIS — R410 Disorientation, unspecified: Secondary | ICD-10-CM | POA: Diagnosis not present

## 2018-01-19 DIAGNOSIS — F0281 Dementia in other diseases classified elsewhere with behavioral disturbance: Secondary | ICD-10-CM | POA: Diagnosis not present

## 2018-01-26 ENCOUNTER — Telehealth: Payer: Self-pay | Admitting: Internal Medicine

## 2018-01-26 NOTE — Telephone Encounter (Signed)
Mammogram and bone density order was placed 9/4  [place] spoke with pt in sept and she stated she would make her own appointment  I spoke with pt daughter gail 10/11 and today 11/13 stating she will make the appointment it just hasn't been a good time.  Do you want to me to keep calling pt to schedule

## 2018-01-26 NOTE — Telephone Encounter (Signed)
No, if they want to schedule, they can call.

## 2018-02-08 ENCOUNTER — Other Ambulatory Visit: Payer: Self-pay | Admitting: Internal Medicine

## 2018-03-28 ENCOUNTER — Encounter: Payer: Self-pay | Admitting: Internal Medicine

## 2018-06-22 ENCOUNTER — Encounter: Payer: Self-pay | Admitting: Internal Medicine

## 2018-06-23 MED ORDER — FENOFIBRATE 54 MG PO TABS: 54.0000 mg | ORAL_TABLET | Freq: Every day | ORAL | 0 refills | Status: DC

## 2018-08-26 ENCOUNTER — Other Ambulatory Visit: Payer: Self-pay | Admitting: Internal Medicine

## 2018-08-30 ENCOUNTER — Other Ambulatory Visit: Payer: Self-pay | Admitting: Internal Medicine

## 2018-08-31 DIAGNOSIS — R399 Unspecified symptoms and signs involving the genitourinary system: Secondary | ICD-10-CM | POA: Diagnosis not present

## 2018-11-23 ENCOUNTER — Encounter: Payer: Medicare HMO | Admitting: Internal Medicine

## 2018-11-28 ENCOUNTER — Other Ambulatory Visit: Payer: Self-pay | Admitting: Internal Medicine

## 2018-12-05 DIAGNOSIS — R399 Unspecified symptoms and signs involving the genitourinary system: Secondary | ICD-10-CM | POA: Diagnosis not present

## 2019-01-04 ENCOUNTER — Ambulatory Visit (INDEPENDENT_AMBULATORY_CARE_PROVIDER_SITE_OTHER): Payer: Medicare HMO | Admitting: Internal Medicine

## 2019-01-04 ENCOUNTER — Other Ambulatory Visit: Payer: Self-pay

## 2019-01-04 ENCOUNTER — Encounter: Payer: Self-pay | Admitting: Internal Medicine

## 2019-01-04 VITALS — BP 134/80 | HR 67 | Temp 97.0°F | Ht 69.0 in | Wt 175.0 lb

## 2019-01-04 DIAGNOSIS — Z23 Encounter for immunization: Secondary | ICD-10-CM | POA: Diagnosis not present

## 2019-01-04 DIAGNOSIS — F02818 Dementia in other diseases classified elsewhere, unspecified severity, with other behavioral disturbance: Secondary | ICD-10-CM

## 2019-01-04 DIAGNOSIS — E7800 Pure hypercholesterolemia, unspecified: Secondary | ICD-10-CM

## 2019-01-04 DIAGNOSIS — F0281 Dementia in other diseases classified elsewhere with behavioral disturbance: Secondary | ICD-10-CM | POA: Diagnosis not present

## 2019-01-04 DIAGNOSIS — Z0000 Encounter for general adult medical examination without abnormal findings: Secondary | ICD-10-CM | POA: Diagnosis not present

## 2019-01-04 DIAGNOSIS — F329 Major depressive disorder, single episode, unspecified: Secondary | ICD-10-CM | POA: Diagnosis not present

## 2019-01-04 DIAGNOSIS — G308 Other Alzheimer's disease: Secondary | ICD-10-CM | POA: Diagnosis not present

## 2019-01-04 DIAGNOSIS — K219 Gastro-esophageal reflux disease without esophagitis: Secondary | ICD-10-CM | POA: Diagnosis not present

## 2019-01-04 DIAGNOSIS — M810 Age-related osteoporosis without current pathological fracture: Secondary | ICD-10-CM

## 2019-01-04 DIAGNOSIS — I10 Essential (primary) hypertension: Secondary | ICD-10-CM

## 2019-01-04 NOTE — Assessment & Plan Note (Signed)
Continue Depakote, Aricept and Namenda CMET today She will continue to follow with Dr. Melrose Nakayama

## 2019-01-04 NOTE — Assessment & Plan Note (Signed)
Continue Sertraline and Depakote Support offered today Will monitor

## 2019-01-04 NOTE — Assessment & Plan Note (Signed)
Controlled on Amlodipine- refilled today CMET today Will monitor

## 2019-01-04 NOTE — Progress Notes (Signed)
HPI:  Pt presents to the clinic today for her annual subsequent Medicare Wellness Exam. She is also due to follow up chronic conditions.  Alzheimer's Disease with Behavioral Disturbance/Delusion's: She has short term memory issues. She is taking Depakote, Aricept and Namenda as prescribed.   Depression: Chronic but stable on Sertraline and Depakote. She denies anxiety, SI/HI.  GERD: Chronic but stable on Pantoprazole. Upper GI from 02/2010 reviewed.  HLD: Her last LDL was 104, 11/2017. She denies myalgias on Simvastatin of Fenfibrate. She tries to consume a low fat diet.  HTN: Her BP today is 134/80. She is taking Amlodipine as prescribed. ECG from 06/2016 reviewed.  Osteoporosis: She is no longer taking Boniva as prescribed. She takes Calcium and Vit D as well. Bone density from 11/2015 reviewed.  Past Medical History:  Diagnosis Date  . Alzheimer disease   . Depression   . GERD (gastroesophageal reflux disease)   . Hyperlipidemia   . Hypertension     Current Outpatient Medications  Medication Sig Dispense Refill  . acetaminophen (TYLENOL) 325 MG tablet Take 2 tablets (650 mg total) by mouth every 6 (six) hours as needed for mild pain.    Marland Kitchen amLODipine (NORVASC) 5 MG tablet TAKE 1 TABLET (5 MG TOTAL) BY MOUTH DAILY. 90 tablet 0  . aspirin 81 MG tablet Take 81 mg by mouth daily.    . Calcium Carbonate (CALCIUM 600 PO) Take 1 capsule by mouth 2 (two) times daily.    . cholecalciferol (VITAMIN D) 1000 units tablet Take 1,000 Units by mouth daily.    . divalproex (DEPAKOTE) 125 MG DR tablet Take 1 tablet by mouth daily.    Marland Kitchen donepezil (ARICEPT) 10 MG tablet Take 1 tablet (10 mg total) by mouth at bedtime. 90 tablet 2  . fenofibrate 54 MG tablet Take 1 tablet (54 mg total) by mouth daily. MUST SCHEDULE PHYSICAL 90 tablet 0  . ibandronate (BONIVA) 150 MG tablet USE AS DIRECTED BY YOUR PRESCRIBER. COMPLETE DIRECTIONS ARE INCLUDED IN A LETTER WITH YOUR ORIGINAL ORDER 3 tablet 4  . Melatonin  5 MG TABS Take 1 tablet by mouth at bedtime and may repeat dose one time if needed.     . memantine (NAMENDA) 5 MG tablet TAKE 1 TABLET TWICE DAILY 180 tablet 1  . Omega-3 Fatty Acids (FISH OIL) 1000 MG CAPS Take 1 capsule by mouth daily.    . pantoprazole (PROTONIX) 40 MG tablet Take 1 tablet (40 mg total) by mouth 2 (two) times daily. MUST SCHEDULE PHYSICAL 180 tablet 0  . sertraline (ZOLOFT) 50 MG tablet Take 1 tablet (50 mg total) by mouth daily. 90 tablet 2  . simvastatin (ZOCOR) 40 MG tablet Take 1 tablet (40 mg total) by mouth daily at 6 PM. 90 tablet 0  . vitamin B-12 (CYANOCOBALAMIN) 1000 MCG tablet Take 1,000 mcg by mouth daily.    . vitamin E 400 UNIT capsule Take 400 Units by mouth daily.     No current facility-administered medications for this visit.     Allergies  Allergen Reactions  . Lisinopril Cough    Family History  Problem Relation Age of Onset  . Heart disease Mother   . Hypertension Mother   . Arthritis Father   . Cancer Father        Prostate  . Cancer Son        Lung  . Cancer Sister        Breast  . Arthritis Sister   . Cancer  Brother        Throat  . Hypertension Daughter   . Diabetes Paternal Aunt   . Diabetes Paternal Uncle   . Diabetes Paternal Grandfather   . Cancer Sister        Breast  . Arthritis Sister   . Cancer Sister        Breast  . Arthritis Sister   . Cancer Brother        Prostate  . Diabetes Daughter        Uterine  . Stroke Neg Hx     Social History   Socioeconomic History  . Marital status: Widowed    Spouse name: Not on file  . Number of children: Not on file  . Years of education: Not on file  . Highest education level: Not on file  Occupational History  . Not on file  Social Needs  . Financial resource strain: Not on file  . Food insecurity    Worry: Not on file    Inability: Not on file  . Transportation needs    Medical: Not on file    Non-medical: Not on file  Tobacco Use  . Smoking status: Never  Smoker  . Smokeless tobacco: Never Used  Substance and Sexual Activity  . Alcohol use: No  . Drug use: No  . Sexual activity: Not on file  Lifestyle  . Physical activity    Days per week: Not on file    Minutes per session: Not on file  . Stress: Not on file  Relationships  . Social Herbalist on phone: Not on file    Gets together: Not on file    Attends religious service: Not on file    Active member of club or organization: Not on file    Attends meetings of clubs or organizations: Not on file    Relationship status: Not on file  . Intimate partner violence    Fear of current or ex partner: Not on file    Emotionally abused: Not on file    Physically abused: Not on file    Forced sexual activity: Not on file  Other Topics Concern  . Not on file  Social History Narrative  . Not on file    Hospitiliaztions: None  Health Maintenance:    Flu: 11/2017  Tetanus: 06/2016  Pneumovax: 04/2012  Prevnar: 07/2013  Zostavax: never  Shingrix: never  Mammogram: 12/2015  Pap Smear: no longer screening  Bone Density: 11/2015  Colon Screening: 2011  Eye Doctor: annually  Dental Exam: dentures   Providers:   PCP: Webb Silversmith, NP-C  Neurology: Dr. Melrose Nakayama  Orthopedics: Dr. Erlinda Hong   I have personally reviewed and have noted:  1. The patient's medical and social history 2. Their use of alcohol, tobacco or illicit drugs 3. Their current medications and supplements 4. The patient's functional ability including ADL's, fall risks, home safety risks and hearing or visual impairment. 5. Diet and physical activities 6. Evidence for depression or mood disorder  Subjective:   Review of Systems:   Constitutional: Denies fever, malaise, fatigue, headache or abrupt weight changes.  HEENT: Denies eye pain, eye redness, ear pain, ringing in the ears, wax buildup, runny nose, nasal congestion, bloody nose, or sore throat. Respiratory: Denies difficulty breathing, shortness of  breath, cough or sputum production.   Cardiovascular: Denies chest pain, chest tightness, palpitations or swelling in the hands or feet.  Gastrointestinal: Denies abdominal pain, bloating, constipation, diarrhea or  blood in the stool.  GU: Denies urgency, frequency, pain with urination, burning sensation, blood in urine, odor or discharge. Musculoskeletal: Denies decrease in range of motion, difficulty with gait, muscle pain or joint pain and swelling.  Skin: Denies redness, rashes, lesions or ulcercations.  Neurological: Pt reports difficulty with memory. Denies dizziness, difficulty with speech or problems with balance and coordination.  Psych: Pt has a history of depression, delusions. Denies anxiety, SI/HI.  No other specific complaints in a complete review of systems (except as listed in HPI above).  Objective:  PE:   BP 134/80   Pulse 67   Temp (!) 97 F (36.1 C) (Temporal)   Ht 5\' 9"  (1.753 m)   Wt 175 lb (79.4 kg)   SpO2 95%   BMI 25.84 kg/m   Wt Readings from Last 3 Encounters:  11/17/17 176 lb (79.8 kg)  11/11/16 175 lb (79.4 kg)  08/31/16 176 lb (79.8 kg)    General: Appears her stated age, well developed, well nourished in NAD. Skin: Warm, dry and intact. No rashes noted. HEENT: Head: normal shape and size; Eyes: sclera white, no icterus, conjunctiva pink, PERRLA and EOMs intact; Ears: Tm's gray and intact, normal light reflex;  Neck: Neck supple, trachea midline. No masses, lumps or thyromegaly present.  Cardiovascular: Normal rate and rhythm. S1,S2 noted.  No murmur, rubs or gallops noted. No JVD or BLE edema. No carotid bruits noted. Pulmonary/Chest: Normal effort and positive vesicular breath sounds. No respiratory distress. No wheezes, rales or ronchi noted.  Abdomen: Soft and nontender. Normal bowel sounds. No distention or masses noted. Liver, spleen and kidneys non palpable. Musculoskeletal: Strength 5/5 BUE/BLE. No signs of joint swelling.  Neurological:  Alert and oriented. Cranial nerves II-XII grossly intact. Coordination normal.  Psychiatric: Mood and affect normal. Behavior is normal. Judgment and thought content normal.     BMET    Component Value Date/Time   NA 141 11/17/2017 1505   K 5.0 11/17/2017 1505   CL 102 11/17/2017 1505   CO2 34 (H) 11/17/2017 1505   GLUCOSE 96 11/17/2017 1505   BUN 19 11/17/2017 1505   CREATININE 1.02 11/17/2017 1505   CALCIUM 9.5 11/17/2017 1505   GFRNONAA >60 07/13/2016 0316   GFRAA >60 07/13/2016 0316    Lipid Panel     Component Value Date/Time   CHOL 170 11/17/2017 1505   TRIG 258.0 (H) 11/17/2017 1505   HDL 45.10 11/17/2017 1505   CHOLHDL 4 11/17/2017 1505   VLDL 51.6 (H) 11/17/2017 1505   LDLCALC 91 11/11/2016 1501    CBC    Component Value Date/Time   WBC 9.3 11/17/2017 1505   RBC 4.95 11/17/2017 1505   HGB 13.9 11/17/2017 1505   HCT 42.7 11/17/2017 1505   PLT 304.0 11/17/2017 1505   MCV 86.2 11/17/2017 1505   MCH 26.8 07/13/2016 0316   MCHC 32.6 11/17/2017 1505   RDW 15.6 (H) 11/17/2017 1505   LYMPHSABS 2.5 07/11/2016 2127   MONOABS 0.6 07/11/2016 2127   EOSABS 0.0 07/11/2016 2127   BASOSABS 0.0 07/11/2016 2127    Hgb A1C No results found for: HGBA1C    Assessment and Plan:   Medicare Annual Wellness Visit:  Diet: She does eat meat. She consumes fruits and veggies daily. She does eat some fried foods. She drinks mostly coke, sweet tea and water. Physical activity: Walk Depression/mood screen: Chronic on meds. PHQ 9 of 1 Hearing: Intact to whispered voice Visual acuity: Grossly normal, performs annual eye  exam  ADLs: Capable Fall risk: None Home safety: Good Cognitive evaluation: Intact to orientation, naming, Trouble with recall and repetition EOL planning: No adv directives, full code/ I agree  Preventative Medicine: Flu shot today. Tetanus, pneumovax and prevnar are UTD. She declines Shingrix at this time. No longer plan to screen for breast cancer,  cervical cancer, worsening osteoporosis, or colon cancer screening. Encouraged her to consume a balanced diet and exercise regimen. Advised her to see an eye doctor annually. No need to see a dentist. Will check CBC, CMET, Lipid and Vit D. Due dates for screening exams given to patient as part of her AVS.   Next appointment: 1 year, Medicare Wellness Exam   Webb Silversmith, NP

## 2019-01-04 NOTE — Patient Instructions (Signed)

## 2019-01-04 NOTE — Assessment & Plan Note (Signed)
CBC and CMET today Encouraged her to avoid foods that trigger her reflux Continue Pantoprazole- refilled today

## 2019-01-04 NOTE — Assessment & Plan Note (Signed)
No longer on Boniva Does not want to recheck bone density Continue Calcium and Vit D

## 2019-01-04 NOTE — Assessment & Plan Note (Addendum)
CMET and Lipid profile today Encouraged her to consume a low fat diet Continue Simvastatin and Fenofibrate for now- refilled today

## 2019-01-05 LAB — COMPREHENSIVE METABOLIC PANEL
ALT: 6 U/L (ref 0–35)
AST: 14 U/L (ref 0–37)
Albumin: 4 g/dL (ref 3.5–5.2)
Alkaline Phosphatase: 54 U/L (ref 39–117)
BUN: 19 mg/dL (ref 6–23)
CO2: 30 mEq/L (ref 19–32)
Calcium: 9.1 mg/dL (ref 8.4–10.5)
Chloride: 102 mEq/L (ref 96–112)
Creatinine, Ser: 1.14 mg/dL (ref 0.40–1.20)
GFR: 45.85 mL/min — ABNORMAL LOW (ref >60.00)
Glucose, Bld: 178 mg/dL — ABNORMAL HIGH (ref 70–99)
Potassium: 3.9 mEq/L (ref 3.5–5.1)
Sodium: 140 mEq/L (ref 135–145)
Total Bilirubin: 0.4 mg/dL (ref 0.2–1.2)
Total Protein: 6.3 g/dL (ref 6.0–8.3)

## 2019-01-05 LAB — CBC
HCT: 40.6 % (ref 36.0–46.0)
Hemoglobin: 13.2 g/dL (ref 12.0–15.0)
MCHC: 32.5 g/dL (ref 30.0–36.0)
MCV: 87.3 fl (ref 78.0–100.0)
Platelets: 275 10*3/uL (ref 150.0–400.0)
RBC: 4.65 Mil/uL (ref 3.87–5.11)
RDW: 15 % (ref 11.5–15.5)
WBC: 7.2 10*3/uL (ref 4.0–10.5)

## 2019-01-05 LAB — LDL CHOLESTEROL, DIRECT: Direct LDL: 81 mg/dL

## 2019-01-05 LAB — LIPID PANEL
Cholesterol: 157 mg/dL (ref 0–200)
HDL: 47.9 mg/dL (ref >39.00)
NonHDL: 109.23
Total CHOL/HDL Ratio: 3
Triglycerides: 202 mg/dL — ABNORMAL HIGH (ref 0.0–149.0)
VLDL: 40.4 mg/dL — ABNORMAL HIGH (ref 0.0–40.0)

## 2019-01-05 LAB — VITAMIN D 25 HYDROXY (VIT D DEFICIENCY, FRACTURES): VITD: 37 ng/mL (ref 30.00–100.00)

## 2019-01-06 ENCOUNTER — Encounter: Payer: Self-pay | Admitting: Internal Medicine

## 2019-01-07 MED ORDER — FENOFIBRATE 54 MG PO TABS: 54.0000 mg | ORAL_TABLET | Freq: Every day | ORAL | 2 refills | Status: DC

## 2019-01-07 MED ORDER — DONEPEZIL HCL 10 MG PO TABS: 10.0000 mg | ORAL_TABLET | Freq: Every day | ORAL | 2 refills | Status: DC

## 2019-01-07 MED ORDER — SERTRALINE HCL 50 MG PO TABS: 50.0000 mg | ORAL_TABLET | Freq: Every day | ORAL | 2 refills | Status: DC

## 2019-01-07 MED ORDER — AMLODIPINE BESYLATE 5 MG PO TABS: 5.0000 mg | ORAL_TABLET | Freq: Every day | ORAL | 2 refills | Status: DC

## 2019-01-07 MED ORDER — SIMVASTATIN 40 MG PO TABS: 40.0000 mg | ORAL_TABLET | Freq: Every day | ORAL | 2 refills | Status: DC

## 2019-01-07 MED ORDER — PANTOPRAZOLE SODIUM 40 MG PO TBEC: 40.0000 mg | DELAYED_RELEASE_TABLET | Freq: Two times a day (BID) | ORAL | 2 refills | Status: AC

## 2019-01-12 ENCOUNTER — Encounter: Payer: Self-pay | Admitting: Internal Medicine

## 2019-01-15 IMAGING — CT CT MAXILLOFACIAL W/O CM
3 of 11 series · 14 of 47 positions shown, 16 images · non-contrast
Comparison: 05/14/2015

CLINICAL DATA: Pt states she she "blacked out" and fell down 3-4
steps tonight. Pt currently c/o head and neck pain. Laceration to
left forehead noted.

EXAM:
CT HEAD WITHOUT CONTRAST
CT MAXILLOFACIAL WITHOUT CONTRAST
CT CERVICAL SPINE WITHOUT CONTRAST
TECHNIQUE: Multidetector CT imaging of the head, cervical spine, and
maxillofacial structures were performed using the standard protocol
without intravenous contrast. Multiplanar CT image reconstructions
of the cervical spine and maxillofacial structures were also
generated.

[Series 8: facial/ orbits 2.0 h30s · axial · 0.29mm/px · z∈[-110,+8]mm · 7 of 79 slices shown, 9 images]
[im 10/79  brain]
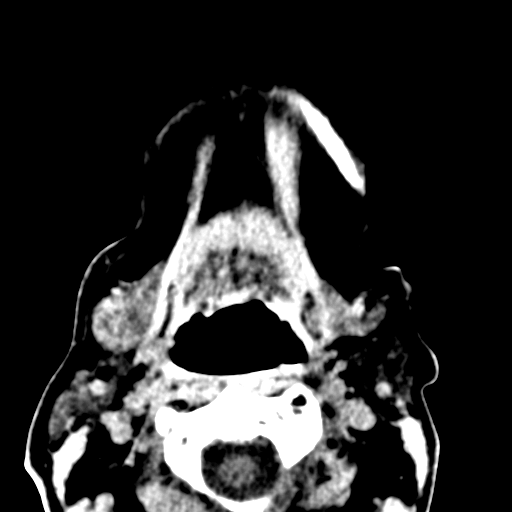
[im 10/79  bone]
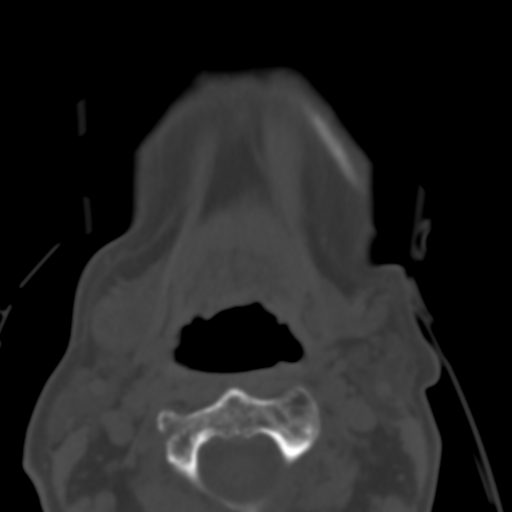
[im 20/79  bone]
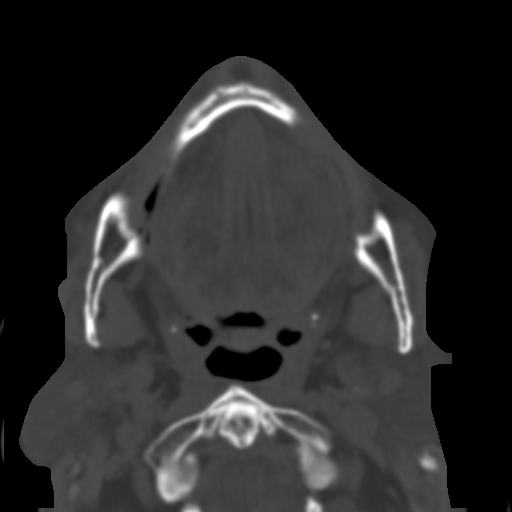
[im 30/79  bone]
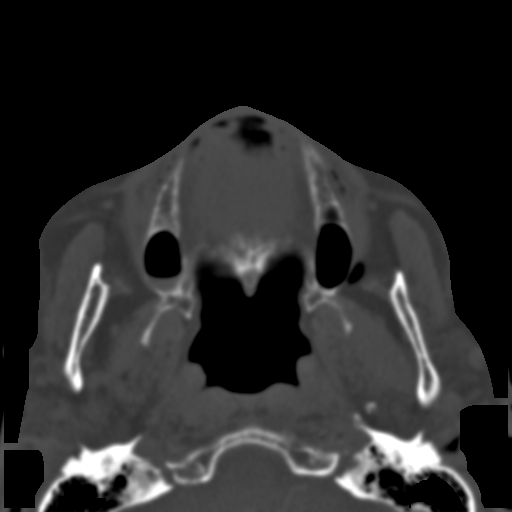
[im 40/79  bone]
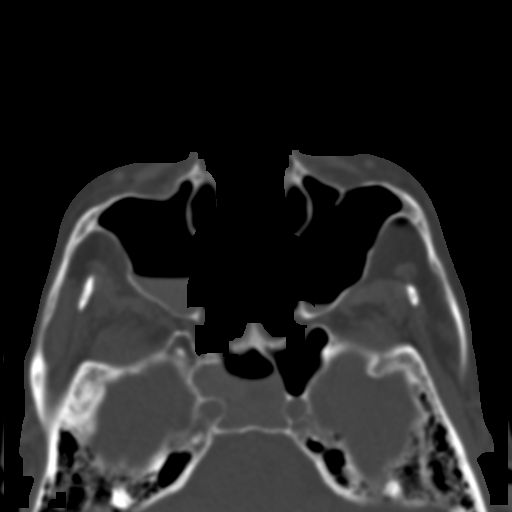
[im 49/79  brain]
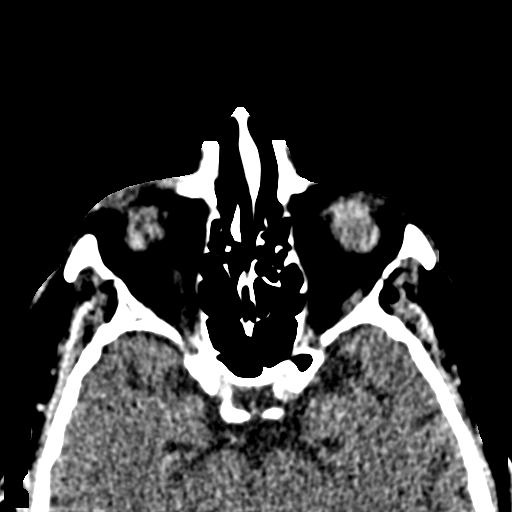
[im 49/79  bone]
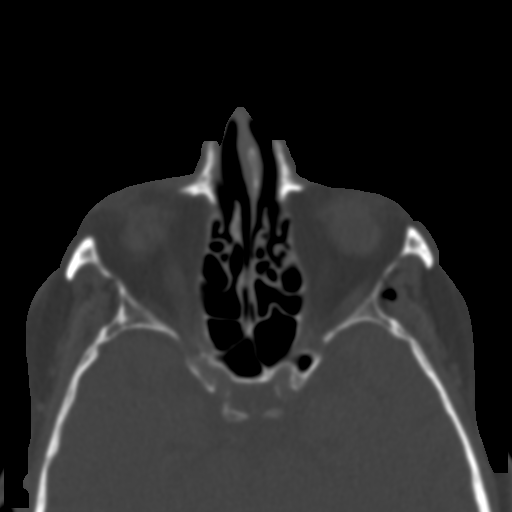
[im 59/79  bone]
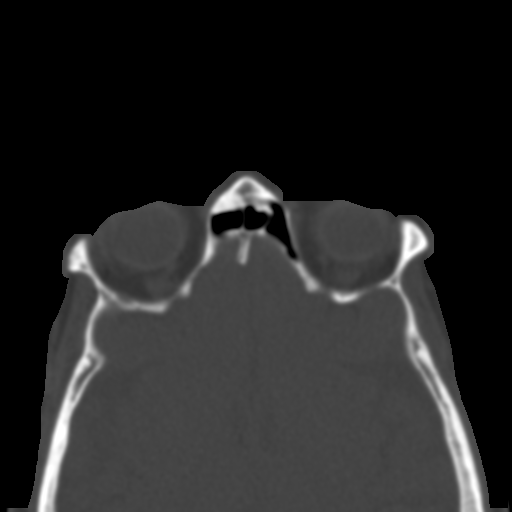
[im 69/79  bone]
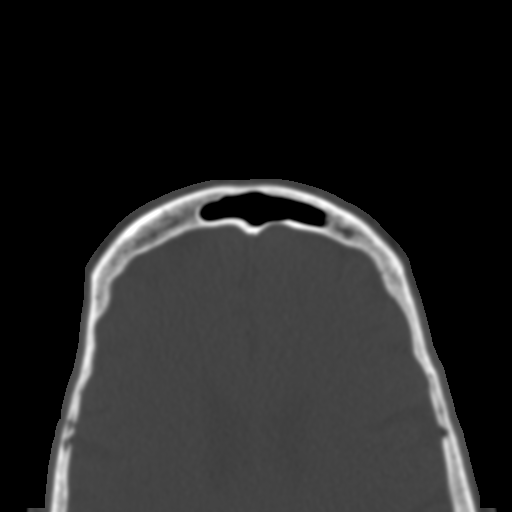

[Series 18: coronal bone · coronal · 0.23mm/px · 2 of 61 slices shown]
[im 21/61  bone]
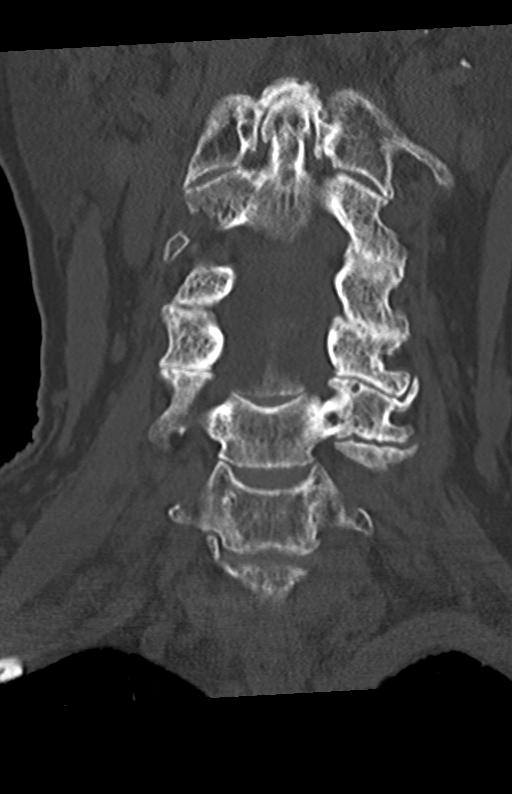
[im 41/61  bone]
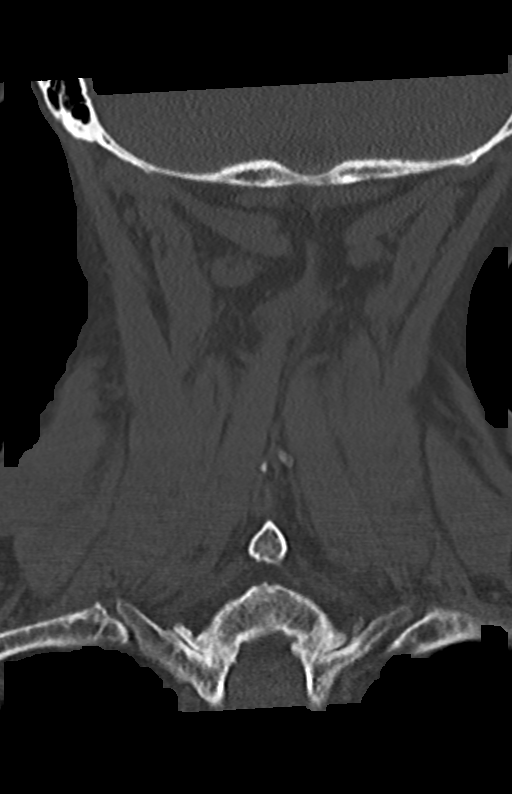

[Series 21: orthogonal axials st · axial · 0.21mm/px · z∈[-214,-136]mm · 5 of 83 slices shown]
[im 11/83  bone]
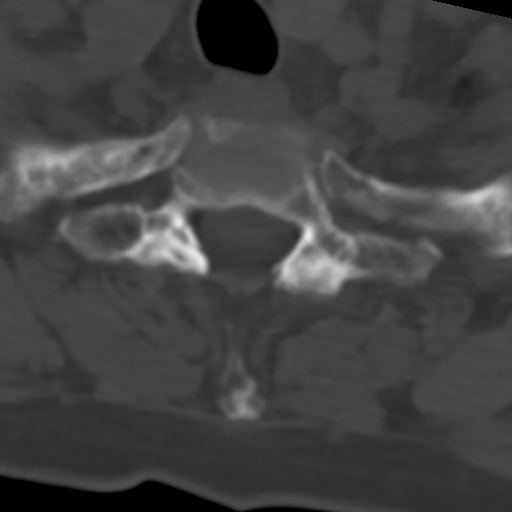
[im 21/83  bone]
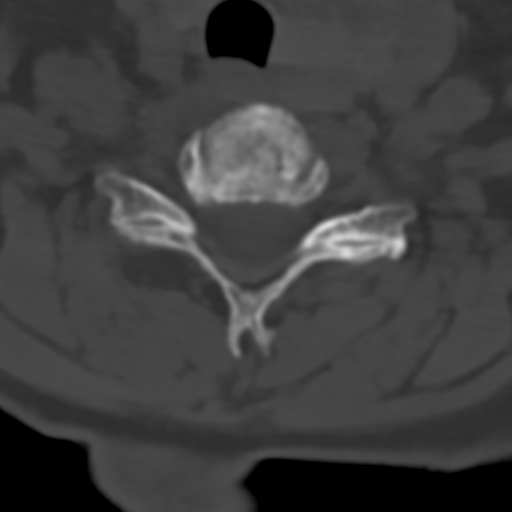
[im 31/83  bone]
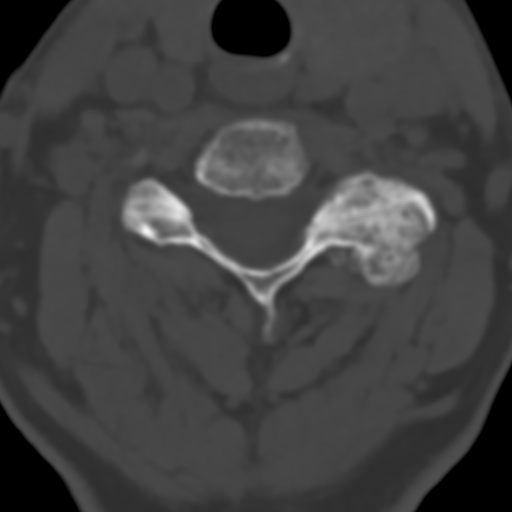
[im 42/83  bone]
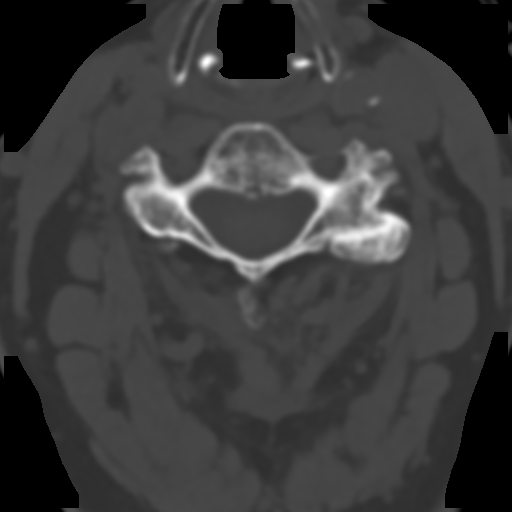
[im 52/83  bone]
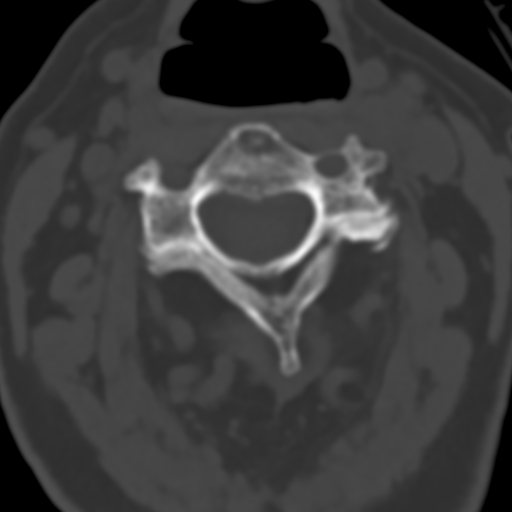

[14 of 47 positions shown; findings below may reference images not displayed]

FINDINGS: CT HEAD FINDINGS

Brain: No evidence of acute infarction, hemorrhage, hydrocephalus,
extra-axial collection or mass lesion/mass effect.

Vascular: No hyperdense vessel or unexpected calcification.

Skull: Normal. Negative for fracture or focal lesion.

Other: There is a frontal scalp hematoma just to the right of
midline.

CT MAXILLOFACIAL FINDINGS

Osseous: There is a fracture, there is comminuted fracture of the
right orbital floor. Along mid orbit, there is mild depression of
the fracture, of 4 mm. There is no entrapment of the extra-ocular
muscles. The fracture extends to the anterior infraorbital rim.
There is a subtle fracture along the lateral right maxillary sinus
wall.

There is air along the lateral left maxillary sinus wall. There is
no definite fracture, but 1 is suspected given the presence of air
as well as a small amount of non dependent hemorrhage in the sinus.
There is no left orbital floor fracture. A subtle fracture of the
lateral left orbital wall is suggested, nondisplaced.

No other evidence of fractures.  No bone lesions.

Orbits: No injury to either globe. No orbital hematoma or postseptal
orbital inflammation.

Sinuses: Dependent hemorrhage is noted in the maxillary sinuses,
right greater than left, as well as the right sphenoid sinus.
Remaining sinuses are clear. The visualized mastoid air cells and
middle ear cavities are clear.

Soft tissues: Small amounts of soft tissue air is seen adjacent to
the right maxillary sinus and along the right cheek and adjacent to
the left lateral orbit and lateral maxillary sinus. No soft tissue
masses or adenopathy.

CT CERVICAL SPINE FINDINGS

Alignment: Normal.

Skull base and vertebrae: There is a fracture, nondisplaced, of the
anterior inferior corner of the C2 vertebra without associated soft
tissue swelling. There is a questionable acute fracture of the
distal aspect of the C3 spinous process, most suggested on the
sagittal view, also with no soft tissue swelling.

No other evidence of a fracture. No osteoblastic or osteolytic
lesions.

Soft tissues and spinal canal: No spinal canal mass, herniated disc
or hematoma.

There is heterogeneous enlargement of the left lobe of the thyroid
gland consistent with goiter.

Disc levels: Moderate loss of disc height with endplate spurring at
C6-C7. Facet degenerative changes noted bilaterally. No significant
central stenosis or neural foraminal narrowing.

Upper chest: Apical pleuroparenchymal scarring. No acute findings in
the lung apices.

Other: None.
IMPRESSION: HEAD CT

1. No acute intracranial abnormalities.
2. No skull fracture.
3. Frontal scalp hematoma.
MAXILLOFACIAL CT

1. Mildly depressed comminuted fracture of the right orbital floor.
2. Subtle fracture of the right lateral maxillary sinus wall.
Probable nondisplaced fracture of the left lateral maxillary sinus
wall. Possible subtle fracture of the lateral left orbital wall.
3. Dependent hemorrhage in the maxillary sinuses and right sphenoid
sinus.
CERVICAL CT

1. Small nondisplaced fracture along the anterior inferior corner of
the C2 vertebra. This appears acute but there is no associated soft
tissue swelling or hemorrhage. Fractures new since the prior CT.
2. Apparent fracture along the distal margin of the C3 spinous
process, nondisplaced.
3. No other fractures or acute findings.

## 2019-01-16 IMAGING — CR DG CHEST 1V PORT
1 series · 1 of 1 positions shown · non-contrast
Comparison: July 11, 2016

CLINICAL DATA: Pain following fall with multifocal trauma

EXAM:
PORTABLE CHEST 1 VIEW

[AP]
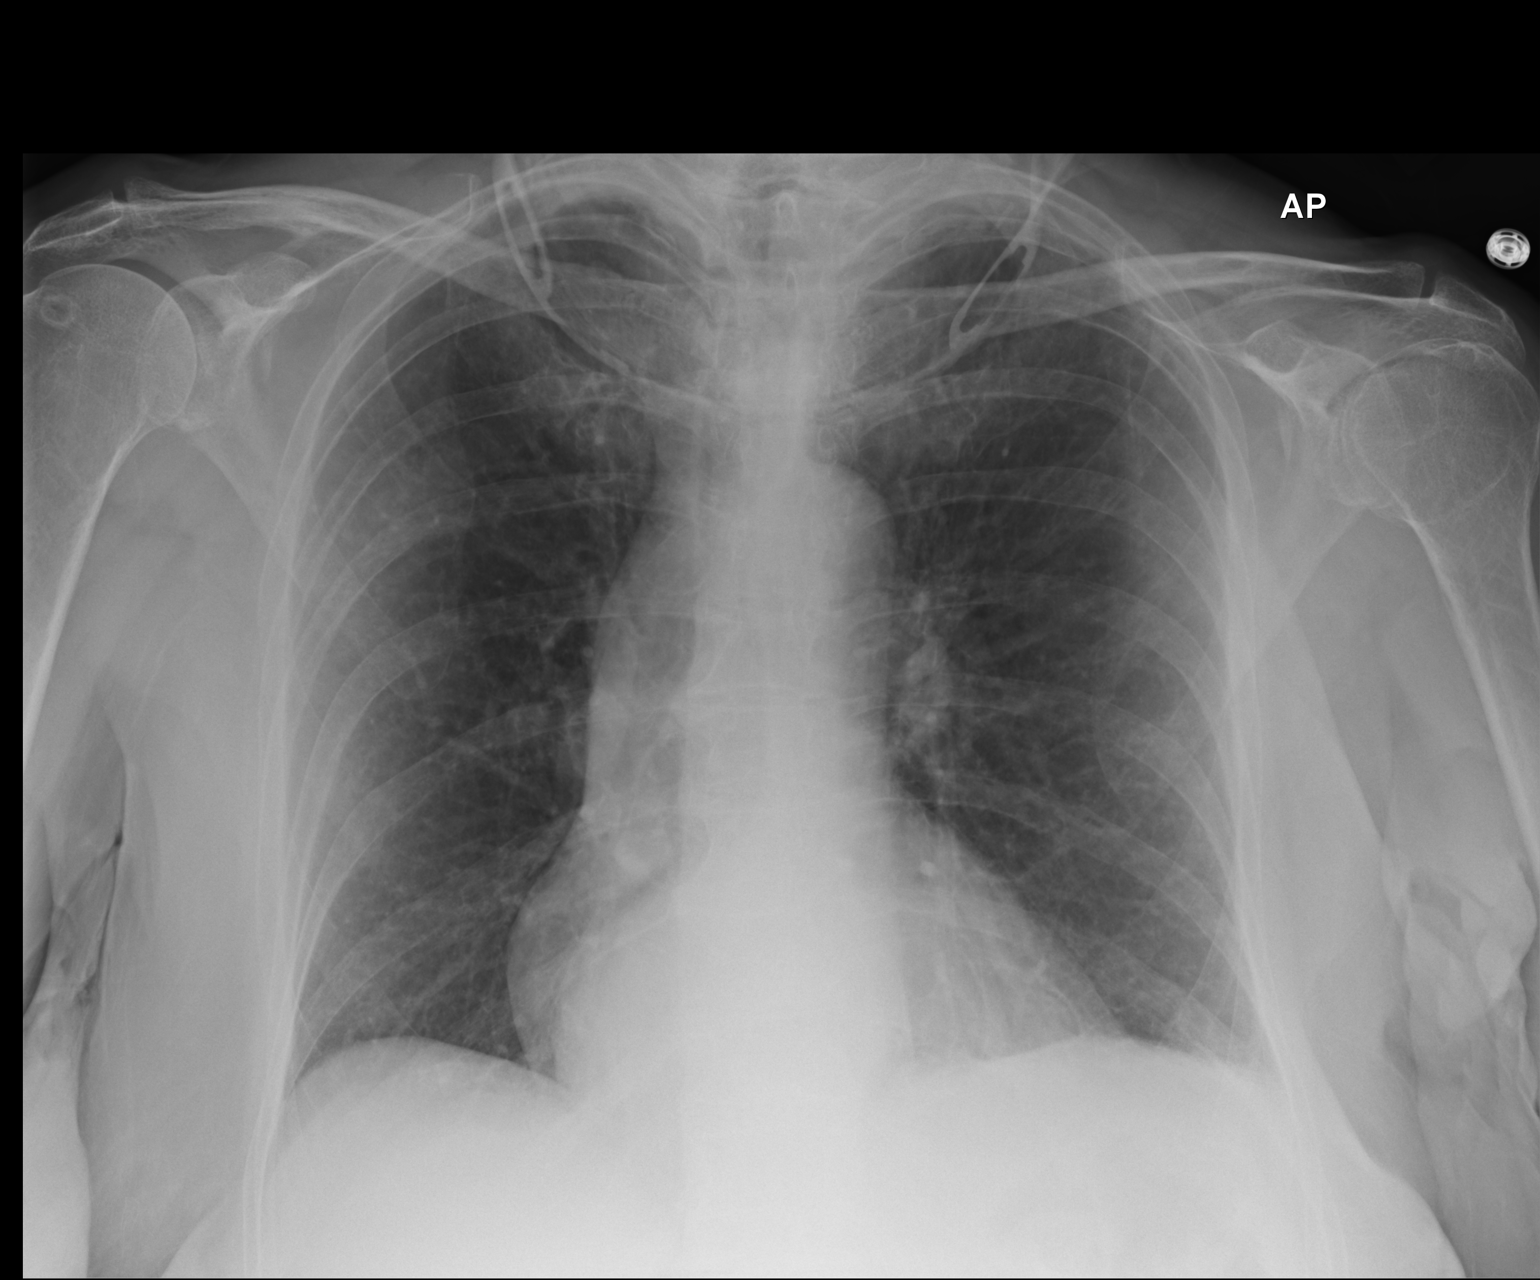

[1 of 1 positions shown; findings below may reference images not displayed]

FINDINGS: No pneumothorax. No edema or consolidation. Heart size and pulmonary
vascularity are normal. No adenopathy. There is a hiatal hernia
evident. No convincing rib fracture appreciable on this current
radiographic evaluation.
IMPRESSION: No pneumothorax. No edema or consolidation. Stable cardiac
silhouette. There is an apparent hiatal hernia.

## 2019-03-01 DIAGNOSIS — G309 Alzheimer's disease, unspecified: Secondary | ICD-10-CM | POA: Diagnosis not present

## 2019-03-01 DIAGNOSIS — F0281 Dementia in other diseases classified elsewhere with behavioral disturbance: Secondary | ICD-10-CM | POA: Diagnosis not present

## 2019-04-26 DIAGNOSIS — H02002 Unspecified entropion of right lower eyelid: Secondary | ICD-10-CM | POA: Diagnosis not present

## 2019-05-10 DIAGNOSIS — Z0100 Encounter for examination of eyes and vision without abnormal findings: Secondary | ICD-10-CM | POA: Diagnosis not present

## 2019-05-10 DIAGNOSIS — H5203 Hypermetropia, bilateral: Secondary | ICD-10-CM | POA: Diagnosis not present

## 2022-04-07 ENCOUNTER — Emergency Department (HOSPITAL_COMMUNITY): Payer: Medicare (Managed Care)

## 2022-04-07 ENCOUNTER — Other Ambulatory Visit: Payer: Self-pay

## 2022-04-07 ENCOUNTER — Encounter (HOSPITAL_COMMUNITY): Payer: Self-pay | Admitting: Internal Medicine

## 2022-04-07 ENCOUNTER — Inpatient Hospital Stay (HOSPITAL_COMMUNITY)
Admission: EM | Admit: 2022-04-07 | Discharge: 2022-04-10 | DRG: 522 | Disposition: A | Discharge status: Skilled Nursing Facility | Payer: Medicare (Managed Care) | Source: Skilled Nursing Facility | Attending: Internal Medicine | Admitting: Internal Medicine

## 2022-04-07 DIAGNOSIS — S72001A Fracture of unspecified part of neck of right femur, initial encounter for closed fracture: Principal | ICD-10-CM

## 2022-04-07 DIAGNOSIS — F0390 Unspecified dementia without behavioral disturbance: Secondary | ICD-10-CM | POA: Diagnosis not present

## 2022-04-07 DIAGNOSIS — Z66 Do not resuscitate: Secondary | ICD-10-CM | POA: Diagnosis present

## 2022-04-07 DIAGNOSIS — Z8249 Family history of ischemic heart disease and other diseases of the circulatory system: Secondary | ICD-10-CM | POA: Diagnosis not present

## 2022-04-07 DIAGNOSIS — F32A Depression, unspecified: Secondary | ICD-10-CM | POA: Diagnosis present

## 2022-04-07 DIAGNOSIS — E876 Hypokalemia: Secondary | ICD-10-CM | POA: Diagnosis present

## 2022-04-07 DIAGNOSIS — Z79899 Other long term (current) drug therapy: Secondary | ICD-10-CM

## 2022-04-07 DIAGNOSIS — F0280 Dementia in other diseases classified elsewhere without behavioral disturbance: Secondary | ICD-10-CM | POA: Diagnosis not present

## 2022-04-07 DIAGNOSIS — Z9049 Acquired absence of other specified parts of digestive tract: Secondary | ICD-10-CM

## 2022-04-07 DIAGNOSIS — Z9851 Tubal ligation status: Secondary | ICD-10-CM | POA: Diagnosis not present

## 2022-04-07 DIAGNOSIS — K219 Gastro-esophageal reflux disease without esophagitis: Secondary | ICD-10-CM | POA: Diagnosis present

## 2022-04-07 DIAGNOSIS — F02818 Dementia in other diseases classified elsewhere, unspecified severity, with other behavioral disturbance: Secondary | ICD-10-CM | POA: Diagnosis present

## 2022-04-07 DIAGNOSIS — W1830XA Fall on same level, unspecified, initial encounter: Secondary | ICD-10-CM | POA: Diagnosis present

## 2022-04-07 DIAGNOSIS — Z888 Allergy status to other drugs, medicaments and biological substances status: Secondary | ICD-10-CM | POA: Diagnosis not present

## 2022-04-07 DIAGNOSIS — M810 Age-related osteoporosis without current pathological fracture: Secondary | ICD-10-CM | POA: Diagnosis present

## 2022-04-07 DIAGNOSIS — G309 Alzheimer's disease, unspecified: Secondary | ICD-10-CM | POA: Diagnosis present

## 2022-04-07 DIAGNOSIS — S72009A Fracture of unspecified part of neck of unspecified femur, initial encounter for closed fracture: Secondary | ICD-10-CM | POA: Diagnosis present

## 2022-04-07 DIAGNOSIS — I10 Essential (primary) hypertension: Secondary | ICD-10-CM | POA: Diagnosis present

## 2022-04-07 DIAGNOSIS — R0902 Hypoxemia: Secondary | ICD-10-CM | POA: Diagnosis not present

## 2022-04-07 DIAGNOSIS — D72829 Elevated white blood cell count, unspecified: Secondary | ICD-10-CM | POA: Diagnosis not present

## 2022-04-07 DIAGNOSIS — Z7982 Long term (current) use of aspirin: Secondary | ICD-10-CM | POA: Diagnosis not present

## 2022-04-07 DIAGNOSIS — E785 Hyperlipidemia, unspecified: Secondary | ICD-10-CM | POA: Diagnosis present

## 2022-04-07 LAB — URINALYSIS, ROUTINE W REFLEX MICROSCOPIC
Bilirubin Urine: NEGATIVE
Glucose, UA: NEGATIVE mg/dL
Hgb urine dipstick: NEGATIVE
Ketones, ur: NEGATIVE mg/dL
Leukocytes,Ua: NEGATIVE
Nitrite: NEGATIVE
Protein, ur: NEGATIVE mg/dL
Specific Gravity, Urine: 1.011 (ref 1.005–1.030)
pH: 8 (ref 5.0–8.0)

## 2022-04-07 LAB — CBC WITH DIFFERENTIAL/PLATELET
Abs Immature Granulocytes: 0.04 10*3/uL (ref 0.00–0.07)
Basophils Absolute: 0 10*3/uL (ref 0.0–0.1)
Basophils Relative: 0 %
Eosinophils Absolute: 0 10*3/uL (ref 0.0–0.5)
Eosinophils Relative: 0 %
HCT: 46 % (ref 36.0–46.0)
Hemoglobin: 15.5 g/dL — ABNORMAL HIGH (ref 12.0–15.0)
Immature Granulocytes: 0 %
Lymphocytes Relative: 19 %
Lymphs Abs: 1.7 10*3/uL (ref 0.7–4.0)
MCH: 30.3 pg (ref 26.0–34.0)
MCHC: 33.7 g/dL (ref 30.0–36.0)
MCV: 89.8 fL (ref 80.0–100.0)
Monocytes Absolute: 0.8 10*3/uL (ref 0.1–1.0)
Monocytes Relative: 9 %
Neutro Abs: 6.6 10*3/uL (ref 1.7–7.7)
Neutrophils Relative %: 72 %
Platelets: 287 10*3/uL (ref 150–400)
RBC: 5.12 MIL/uL — ABNORMAL HIGH (ref 3.87–5.11)
RDW: 13.7 % (ref 11.5–15.5)
WBC: 9.2 10*3/uL (ref 4.0–10.5)
nRBC: 0 % (ref 0.0–0.2)

## 2022-04-07 LAB — BASIC METABOLIC PANEL
Anion gap: 10 (ref 5–15)
BUN: 17 mg/dL (ref 8–23)
CO2: 32 mmol/L (ref 22–32)
Calcium: 9.4 mg/dL (ref 8.9–10.3)
Chloride: 95 mmol/L — ABNORMAL LOW (ref 98–111)
Creatinine, Ser: 0.63 mg/dL (ref 0.44–1.00)
GFR, Estimated: >60 mL/min (ref >60)
Glucose, Bld: 96 mg/dL (ref 70–99)
Potassium: 3.6 mmol/L (ref 3.5–5.1)
Sodium: 137 mmol/L (ref 135–145)

## 2022-04-07 LAB — CK: Total CK: 97 U/L (ref 38–234)

## 2022-04-07 LAB — CBG MONITORING, ED: Glucose-Capillary: 83 mg/dL (ref 70–99)

## 2022-04-07 MED ORDER — OXYCODONE HCL 5 MG PO TABS: 5.0000 mg | ORAL_TABLET | ORAL | Status: DC | PRN

## 2022-04-07 MED ORDER — MORPHINE SULFATE (PF) 2 MG/ML IV SOLN
2.0000 mg | INTRAVENOUS | Status: DC | PRN
  Administered 2022-04-07: 2 mg via INTRAVENOUS
  Filled 2022-04-07: qty 1

## 2022-04-07 MED ORDER — DIVALPROEX SODIUM 500 MG PO DR TAB
500.0000 mg | DELAYED_RELEASE_TABLET | Freq: Every day | ORAL | Status: DC
  Administered 2022-04-08 – 2022-04-10 (×3): 500 mg via ORAL
  Filled 2022-04-07 (×3): qty 1

## 2022-04-07 MED ORDER — MEMANTINE HCL ER 28 MG PO CP24
28.0000 mg | ORAL_CAPSULE | Freq: Every day | ORAL | Status: DC
  Administered 2022-04-07 – 2022-04-10 (×4): 28 mg via ORAL
  Filled 2022-04-07 (×4): qty 1

## 2022-04-07 MED ORDER — HYDROCHLOROTHIAZIDE 12.5 MG PO TABS
12.5000 mg | ORAL_TABLET | Freq: Every day | ORAL | Status: DC
  Administered 2022-04-07 – 2022-04-08 (×2): 12.5 mg via ORAL
  Filled 2022-04-07 (×2): qty 1

## 2022-04-07 MED ORDER — DOCUSATE SODIUM 100 MG PO CAPS
100.0000 mg | ORAL_CAPSULE | Freq: Two times a day (BID) | ORAL | Status: DC
  Administered 2022-04-07 – 2022-04-10 (×6): 100 mg via ORAL
  Filled 2022-04-07 (×6): qty 1

## 2022-04-07 MED ORDER — BISACODYL 5 MG PO TBEC: 10.0000 mg | DELAYED_RELEASE_TABLET | Freq: Every day | ORAL | Status: DC | PRN

## 2022-04-07 MED ORDER — METHOCARBAMOL 500 MG PO TABS
500.0000 mg | ORAL_TABLET | Freq: Four times a day (QID) | ORAL | Status: DC | PRN
  Administered 2022-04-07: 500 mg via ORAL
  Filled 2022-04-07: qty 1

## 2022-04-07 MED ORDER — MELATONIN 5 MG PO TABS
5.0000 mg | ORAL_TABLET | Freq: Every evening | ORAL | Status: DC | PRN
  Administered 2022-04-07 – 2022-04-09 (×3): 5 mg via ORAL
  Filled 2022-04-07 (×3): qty 1

## 2022-04-07 MED ORDER — AMLODIPINE BESYLATE 10 MG PO TABS
10.0000 mg | ORAL_TABLET | Freq: Every day | ORAL | Status: DC
  Administered 2022-04-07 – 2022-04-10 (×4): 10 mg via ORAL
  Filled 2022-04-07 (×4): qty 1

## 2022-04-07 MED ORDER — FLUTICASONE PROPIONATE 50 MCG/ACT NA SUSP
1.0000 | Freq: Every day | NASAL | Status: DC
  Administered 2022-04-07 – 2022-04-09 (×3): 1 via NASAL
  Filled 2022-04-07: qty 16

## 2022-04-07 MED ORDER — POLYETHYLENE GLYCOL 3350 17 G PO PACK: 17.0000 g | PACK | Freq: Every day | ORAL | Status: DC | PRN

## 2022-04-07 MED ORDER — ASPIRIN 81 MG PO TBEC
81.0000 mg | DELAYED_RELEASE_TABLET | Freq: Every day | ORAL | Status: DC
  Administered 2022-04-08: 81 mg via ORAL
  Filled 2022-04-07: qty 1

## 2022-04-07 MED ORDER — PANTOPRAZOLE SODIUM 20 MG PO TBEC
20.0000 mg | DELAYED_RELEASE_TABLET | Freq: Two times a day (BID) | ORAL | Status: DC
  Administered 2022-04-07 – 2022-04-10 (×7): 20 mg via ORAL
  Filled 2022-04-07 (×8): qty 1

## 2022-04-07 MED ORDER — ACETAMINOPHEN 325 MG PO TABS
650.0000 mg | ORAL_TABLET | Freq: Four times a day (QID) | ORAL | Status: DC | PRN
  Administered 2022-04-07: 650 mg via ORAL
  Filled 2022-04-07: qty 2

## 2022-04-07 MED ORDER — METHOCARBAMOL 1000 MG/10ML IJ SOLN
500.0000 mg | Freq: Four times a day (QID) | INTRAVENOUS | Status: DC | PRN
  Administered 2022-04-08: 500 mg via INTRAVENOUS
  Filled 2022-04-07: qty 500

## 2022-04-07 MED ORDER — DIVALPROEX SODIUM 250 MG PO DR TAB
375.0000 mg | DELAYED_RELEASE_TABLET | Freq: Every evening | ORAL | Status: DC
  Administered 2022-04-07 – 2022-04-09 (×3): 375 mg via ORAL
  Filled 2022-04-07 (×4): qty 1

## 2022-04-07 MED ORDER — ONDANSETRON HCL 4 MG/2ML IJ SOLN: 4.0000 mg | Freq: Four times a day (QID) | INTRAMUSCULAR | Status: DC | PRN

## 2022-04-07 MED ORDER — SERTRALINE HCL 25 MG PO TABS
75.0000 mg | ORAL_TABLET | Freq: Every day | ORAL | Status: DC
  Administered 2022-04-07 – 2022-04-10 (×4): 75 mg via ORAL
  Filled 2022-04-07 (×3): qty 3

## 2022-04-07 NOTE — ED Notes (Signed)
Purewick placed on pt in triage after transferred from ems stretcher. Pt will remain in triage 2 as long as we can keep here there connected to same.

## 2022-04-07 NOTE — ED Notes (Signed)
ED TO INPATIENT HANDOFF REPORT  ED Nurse Name and Phone #: Daylyn Christine [phone]  S Name/Age/Gender Teresa Francis 82 y.o. female Room/Bed: 046C/046C  Code Status   Code Status: Prior  Home/SNF/Other Home Disorientation Is this baseline? Yes   Triage Complete: Triage complete  Chief Complaint Hip fracture ([place]) [S72.009A]  Triage Note Ems stated,  pt fell last night around 330 and complaining of right hip pain and the Dr. Rosanna Randy her sent out for scans. She has dementia.    Allergies Allergies  Allergen Reactions   Lisinopril Cough    Level of Care/Admitting Diagnosis ED Disposition     ED Disposition  Admit   Condition  --   Comment  Hospital Area: [facility] [[id number]]  Level of Care: Med-Surg [16]  May admit patient to [facility] or [facility] if equivalent level of care is available:: Yes  Covid Evaluation: Asymptomatic - no recent exposure (last 10 days) testing not required  Diagnosis: Hip fracture [facility]) [[id number]]  Admitting Physician: Karmen Bongo [[id number]]  Attending Physician: Karmen Bongo [[id number]]  Certification:: I certify this patient will need inpatient services for at least 2 midnights  Estimated Length of Stay: 4          B Medical/Surgery History Past Medical History:  Diagnosis Date   Alzheimer disease ([place])    Depression    GERD (gastroesophageal reflux disease)    Hyperlipidemia    Hypertension    Past Surgical History:  Procedure Laterality Date   CHOLECYSTECTOMY  2016   [place]     A IV Location/Drains/Wounds Patient Lines/Drains/Airways Status     Active Line/Drains/Airways     Name Placement date Placement time Site Days   Peripheral IV 04/07/22 20 G Right Antecubital 04/07/22  1006  Antecubital  less than 1   External Urinary Catheter 04/07/22  0728  --  less than 1            Intake/Output Last 24 hours No intake or output data in the 24 hours ending 04/07/22  1150  Labs/Imaging Results for orders placed or performed during the hospital encounter of 04/07/22 (from the past 48 hour(s))  POC CBG, ED     Status: None   Collection Time: 04/07/22  8:02 AM  Result Value Ref Range   Glucose-Capillary 83 70 - 99 mg/dL    Comment: Glucose reference range applies only to samples taken after fasting for at least 8 hours.  CBC with Differential     Status: Abnormal   Collection Time: 04/07/22  8:22 AM  Result Value Ref Range   WBC 9.2 4.0 - 10.5 K/uL   RBC 5.12 (H) 3.87 - 5.11 MIL/uL   Hemoglobin 15.5 (H) 12.0 - 15.0 g/dL   HCT 46.0 36.0 - 46.0 %   MCV 89.8 80.0 - 100.0 fL   MCH 30.3 26.0 - 34.0 pg   MCHC 33.7 30.0 - 36.0 g/dL   RDW 13.7 11.5 - 15.5 %   Platelets 287 150 - 400 K/uL   nRBC 0.0 0.0 - 0.2 %   Neutrophils Relative % 72 %   Neutro Abs 6.6 1.7 - 7.7 K/uL   Lymphocytes Relative 19 %   Lymphs Abs 1.7 0.7 - 4.0 K/uL   Monocytes Relative 9 %   Monocytes Absolute 0.8 0.1 - 1.0 K/uL   Eosinophils Relative 0 %   Eosinophils Absolute 0.0 0.0 - 0.5 K/uL   Basophils Relative 0 %   Basophils  Absolute 0.0 0.0 - 0.1 K/uL   Immature Granulocytes 0 %   Abs Immature Granulocytes 0.04 0.00 - 0.07 K/uL    Comment: Performed at [facility], [place] [address] [place], [place] [address]  Basic metabolic panel     Status: Abnormal   Collection Time: 04/07/22  8:22 AM  Result Value Ref Range   Sodium 137 135 - 145 mmol/L   Potassium 3.6 3.5 - 5.1 mmol/L   Chloride 95 (L) 98 - 111 mmol/L   CO2 32 22 - 32 mmol/L   Glucose, Bld 96 70 - 99 mg/dL    Comment: Glucose reference range applies only to samples taken after fasting for at least 8 hours.   BUN 17 8 - 23 mg/dL   Creatinine, Ser 0.63 0.44 - 1.00 mg/dL   Calcium 9.4 8.9 - 10.3 mg/dL   GFR, Estimated >60 >60 mL/min    Comment: (NOTE) Calculated using the CKD-EPI Creatinine Equation (2021)    Anion gap 10 5 - 15    Comment: Performed at [facility] [address], [place],  [place] [address]  CK     Status: None   Collection Time: 04/07/22  8:22 AM  Result Value Ref Range   Total CK 97 38 - 234 U/L    Comment: Performed at [facility], [place] [address] [place], [place] [address]  Urinalysis, Routine w reflex microscopic Urine, Clean Catch     Status: None   Collection Time: 04/07/22 10:00 AM  Result Value Ref Range   Color, Urine YELLOW YELLOW   APPearance CLEAR CLEAR   Specific Gravity, Urine 1.011 1.005 - 1.030   pH 8.0 5.0 - 8.0   Glucose, UA NEGATIVE NEGATIVE mg/dL   Hgb urine dipstick NEGATIVE NEGATIVE   Bilirubin Urine NEGATIVE NEGATIVE   Ketones, ur NEGATIVE NEGATIVE mg/dL   Protein, ur NEGATIVE NEGATIVE mg/dL   Nitrite NEGATIVE NEGATIVE   Leukocytes,Ua NEGATIVE NEGATIVE    Comment: Performed at [facility] [address], [place], [place] [address]   CT Head Wo Contrast  Result Date: 04/07/2022 CLINICAL DATA:  Head trauma. EXAM: CT HEAD WITHOUT CONTRAST TECHNIQUE: Contiguous axial images were obtained from the base of the skull through the vertex without intravenous contrast. RADIATION DOSE REDUCTION: This exam was performed according to the departmental dose-optimization program which includes automated exposure control, adjustment of the mA and/or kV according to patient size and/or use of iterative reconstruction technique. COMPARISON:  07/11/2016. FINDINGS: Brain: There is periventricular white matter decreased attenuation consistent with small vessel ischemic changes. Ventricles, sulci and cisterns are prominent consistent with age related involutional changes. No acute intracranial hemorrhage, mass effect or shift. No hydrocephalus. Vascular: No hyperdense vessel or unexpected calcification. Skull: Normal. Negative for fracture or focal lesion. Sinuses/Orbits: No acute finding. IMPRESSION: Atrophy and chronic small vessel ischemic changes. No acute intracranial process identified. Electronically Signed   By: Sammie Bench M.D.   On:  04/07/2022 09:20   DG Femur Min 2 Views Right  Result Date: 04/07/2022 CLINICAL DATA:  Fall.  Deformity and foreshortening of right leg. EXAM: RIGHT FEMUR 2 VIEWS; PELVIS - 1-2 VIEW; RIGHT KNEE - 1-2 VIEW COMPARISON:  CT right knee 07/12/2016 FINDINGS: There is diffuse decreased bone mineralization. Pelvis and right femur: There is an acute fracture of the right femoral neck with mild varus angulation. Likely mild superior displacement of the distal fracture component with respect to the proximal fracture component. Moderate  superior right greater than left femoroacetabular joint space narrowing. Mild bilateral superolateral acetabular degenerative osteophytes. Mild bilateral sacroiliac subchondral sclerosis. There is mild irregularity of the superior aspect of the right pubic body which appears chronic, however recommend clinical correlation for point tenderness to exclude an acute fracture. Mild-to-moderate superior pubic symphysis joint space narrowing. Right knee: Moderate to severe medial and moderate lateral compartment joint space narrowing with mild peripheral degenerative osteophytes. No lateral view was achieved, and on limited oblique views there appears to be at least mild patellofemoral joint space narrowing and peripheral osteophytosis. There is a small sclerotic focus within the proximal fibula which is similar to prior CT and compatible with a benign enchondroma. No definite knee joint effusion. IMPRESSION: 1. Acute fracture of the right femoral neck with mild varus angulation. 2. There is mild irregularity of the superior aspect of the right pubic body which appears chronic, however recommend clinical correlation for point tenderness to exclude an acute fracture. Electronically Signed   By: Yvonne Kendall M.D.   On: 04/07/2022 09:06   DG Pelvis 1-2 Views  Result Date: 04/07/2022 CLINICAL DATA:  Fall.  Deformity and foreshortening of right leg. EXAM: RIGHT FEMUR 2 VIEWS; PELVIS - 1-2 VIEW;  RIGHT KNEE - 1-2 VIEW COMPARISON:  CT right knee 07/12/2016 FINDINGS: There is diffuse decreased bone mineralization. Pelvis and right femur: There is an acute fracture of the right femoral neck with mild varus angulation. Likely mild superior displacement of the distal fracture component with respect to the proximal fracture component. Moderate superior right greater than left femoroacetabular joint space narrowing. Mild bilateral superolateral acetabular degenerative osteophytes. Mild bilateral sacroiliac subchondral sclerosis. There is mild irregularity of the superior aspect of the right pubic body which appears chronic, however recommend clinical correlation for point tenderness to exclude an acute fracture. Mild-to-moderate superior pubic symphysis joint space narrowing. Right knee: Moderate to severe medial and moderate lateral compartment joint space narrowing with mild peripheral degenerative osteophytes. No lateral view was achieved, and on limited oblique views there appears to be at least mild patellofemoral joint space narrowing and peripheral osteophytosis. There is a small sclerotic focus within the proximal fibula which is similar to prior CT and compatible with a benign enchondroma. No definite knee joint effusion. IMPRESSION: 1. Acute fracture of the right femoral neck with mild varus angulation. 2. There is mild irregularity of the superior aspect of the right pubic body which appears chronic, however recommend clinical correlation for point tenderness to exclude an acute fracture. Electronically Signed   By: Yvonne Kendall M.D.   On: 04/07/2022 09:06   DG Knee 1-2 Views Right  Result Date: 04/07/2022 CLINICAL DATA:  Fall.  Deformity and foreshortening of right leg. EXAM: RIGHT FEMUR 2 VIEWS; PELVIS - 1-2 VIEW; RIGHT KNEE - 1-2 VIEW COMPARISON:  CT right knee 07/12/2016 FINDINGS: There is diffuse decreased bone mineralization. Pelvis and right femur: There is an acute fracture of the right  femoral neck with mild varus angulation. Likely mild superior displacement of the distal fracture component with respect to the proximal fracture component. Moderate superior right greater than left femoroacetabular joint space narrowing. Mild bilateral superolateral acetabular degenerative osteophytes. Mild bilateral sacroiliac subchondral sclerosis. There is mild irregularity of the superior aspect of the right pubic body which appears chronic, however recommend clinical correlation for point tenderness to exclude an acute fracture. Mild-to-moderate superior pubic symphysis joint space narrowing. Right knee: Moderate to severe medial and moderate lateral compartment joint space narrowing with mild peripheral  degenerative osteophytes. No lateral view was achieved, and on limited oblique views there appears to be at least mild patellofemoral joint space narrowing and peripheral osteophytosis. There is a small sclerotic focus within the proximal fibula which is similar to prior CT and compatible with a benign enchondroma. No definite knee joint effusion. IMPRESSION: 1. Acute fracture of the right femoral neck with mild varus angulation. 2. There is mild irregularity of the superior aspect of the right pubic body which appears chronic, however recommend clinical correlation for point tenderness to exclude an acute fracture. Electronically Signed   By: Yvonne Kendall M.D.   On: 04/07/2022 09:06   DG Chest 1 View  Result Date: 04/07/2022 CLINICAL DATA:  Fall.  Deformity and foreshortening of right leg. EXAM: CHEST  1 VIEW COMPARISON:  AP chest 07/12/2016 FINDINGS: Cardiac silhouette and mediastinal contours are unchanged and within normal limits with mild calcification within the aortic arch. Mild bilateral interstitial thickening is similar to prior and chronic. No focal airspace opacity. No pleural effusion or pneumothorax. No definite acute displaced rib fracture is seen. IMPRESSION: 1. No acute cardiopulmonary  process. 2. Chronic interstitial thickening. 3. No definite acute displaced rib fracture. Electronically Signed   By: Yvonne Kendall M.D.   On: 04/07/2022 09:02    Pending Labs Unresulted Labs (From admission, onward)    None       Vitals/Pain Today's Vitals   04/07/22 1004 04/07/22 1007 04/07/22 1012 04/07/22 1145  BP:   (!) 168/66 (!) 183/84  Pulse:   67 70  Resp:   16 12  Temp:  98.4 F (36.9 C)    TempSrc:  Oral    SpO2:   96% 100%  PainSc: 0-No pain  0-No pain     Isolation Precautions No active isolations  Medications Medications - No data to display  Mobility walks with person assist     Focused Assessments    R Recommendations: See Admitting Provider Note  Report given to:   Additional Notes:

## 2022-04-07 NOTE — ED Provider Notes (Signed)
[facility] Provider Note   CSN: [id number] Arrival date & time: 04/07/22  1607     History  Chief Complaint  Patient presents with   Fall   right hip pain    Teresa Francis is a pleasant 82 y.o. female with Alzheimer's dementia with behavioral disturbance, HTN, HLD, GERD, osteoporosis who presents with fall, R hip pain.   Patient has dementia and cannot provide a good history. She is currently A&Ox1, only to self.  Patient experienced unwitnessed fall at her facility last night around 3:30 AM.  Subsequent been complaining of right hip pain.  She denies any pain anywhere else.  She cannot tell anything about the fall and does not report falling, unsure if she hit her head or lost consciousness.  Other than the hip pain she states that she feels normal, only states that she has a "knot" on her anterior right femur.  Denies any fever/chills, cough, chest pain, shortness of breath, abdominal pain, urinary symptoms, numbness tingling, asymmetric weakness.   Fall       Home Medications Prior to Admission medications   Medication Sig Start Date End Date Taking? Authorizing Provider  acetaminophen (TYLENOL) 325 MG tablet Take 2 tablets (650 mg total) by mouth every 6 (six) hours as needed for mild pain. 07/16/16   Jill Alexanders, PA-C  amLODipine (NORVASC) 5 MG tablet Take 1 tablet (5 mg total) by mouth daily. 01/07/19   Jearld Fenton, NP  aspirin 81 MG tablet Take 81 mg by mouth daily.    [provider]  Calcium Carbonate (CALCIUM 600 PO) Take 1 capsule by mouth 2 (two) times daily.    [provider]  cholecalciferol (VITAMIN D) 1000 units tablet Take 1,000 Units by mouth daily.    [provider]  divalproex (DEPAKOTE) 250 MG DR tablet Take 1 tablet by mouth 2 (two) times daily. 12/05/18   [provider]  donepezil (ARICEPT) 10 MG tablet Take 1 tablet (10 mg total) by mouth at bedtime. 01/07/19    Jearld Fenton, NP  fenofibrate 54 MG tablet Take 1 tablet (54 mg total) by mouth daily. 01/07/19   Jearld Fenton, NP  Melatonin 5 MG TABS Take 1 tablet by mouth at bedtime and may repeat dose one time if needed.     [provider]  memantine (NAMENDA) 5 MG tablet TAKE 1 TABLET TWICE DAILY 11/23/16   Jearld Fenton, NP  Omega-3 Fatty Acids (FISH OIL) 1000 MG CAPS Take 1 capsule by mouth daily.    [provider]  pantoprazole (PROTONIX) 40 MG tablet Take 1 tablet (40 mg total) by mouth 2 (two) times daily. 01/07/19   Jearld Fenton, NP  sertraline (ZOLOFT) 50 MG tablet Take 1 tablet (50 mg total) by mouth daily. 01/07/19   Jearld Fenton, NP  simvastatin (ZOCOR) 40 MG tablet Take 1 tablet (40 mg total) by mouth daily at 6 PM. 01/07/19   Baity, Coralie Keens, NP  vitamin B-12 (CYANOCOBALAMIN) 1000 MCG tablet Take 1,000 mcg by mouth daily.    [provider]  vitamin E 400 UNIT capsule Take 400 Units by mouth daily.    [provider]      Allergies    Lisinopril    Review of Systems   Review of Systems Review of systems Negative for f/c.  A 10 point review of systems was performed and is negative unless otherwise reported in  HPI.  Physical Exam Updated Vital Signs BP (!) 168/66 (BP Location: Left Arm)   Pulse 67   Temp 98.4 F (36.9 C) (Oral)   Resp 16   SpO2 96%  Physical Exam General: Normal appearing female, lying in bed.  HEENT: PERRLA, EOMI, tongue protrudes midline, Sclera anicteric, MMM, trachea midline.  Cardiology: RRR, no murmurs/rubs/gallops. BL radial and DP pulses equal bilaterally.  Resp: Normal respiratory rate and effort. CTAB, no wheezes, rhonchi, crackles.  Abd: Soft, non-tender, non-distended. No rebound tenderness or guarding.  GU: Deferred. MSK: No peripheral edema or signs of trauma. No cyanosis or clubbing. Hip flexion of R hip limited d/t pain. 4x4 cm circumscribed, soft, mobile mass palpable in R anterior femur with  minimal TTP, no overlying erythema/induration/fluctuance. Minimal TTP of R greater trochanter with no visible deformities. Legs are same length.  Compartments are soft and nontender.  Pelvis is stable.  Intact peripheral pulses and range of motion of knee and ankle.  No significant tender palpation or effusion of the right knee or ankle. Skin: warm, dry. No rashes or lesions. Back: No midline C-spine tenderness palpation, step-offs, or deformities Neuro: A&Ox1 (self), CNs II-XII grossly intact. 5/5 strength in all four extremities. Sensation grossly intact.  Psych: Normal mood and affect.   ED Results / Procedures / Treatments   Labs (all labs ordered are listed, but only abnormal results are displayed) Labs Reviewed  CBC WITH DIFFERENTIAL/PLATELET - Abnormal; Notable for the following components:      Result Value   RBC 5.12 (*)    Hemoglobin 15.5 (*)    All other components within normal limits  BASIC METABOLIC PANEL - Abnormal; Notable for the following components:   Chloride 95 (*)    All other components within normal limits  CK  URINALYSIS, ROUTINE W REFLEX MICROSCOPIC  CBG MONITORING, ED    EKG EKG Interpretation  Date/Time:  Tuesday April 07 2022 09:23:25 EST Ventricular Rate:  68 PR Interval:  209 QRS Duration: 93 QT Interval:  446 QTC Calculation: 475 R Axis:   48 Text Interpretation: Sinus rhythm Confirmed by Cindee Lame [phone]) on 04/07/2022 10:49:13 AM  Radiology CT Head Wo Contrast  Result Date: 04/07/2022 CLINICAL DATA:  Head trauma. EXAM: CT HEAD WITHOUT CONTRAST TECHNIQUE: Contiguous axial images were obtained from the base of the skull through the vertex without intravenous contrast. RADIATION DOSE REDUCTION: This exam was performed according to the departmental dose-optimization program which includes automated exposure control, adjustment of the mA and/or kV according to patient size and/or use of iterative reconstruction technique. COMPARISON:   07/11/2016. FINDINGS: Brain: There is periventricular white matter decreased attenuation consistent with small vessel ischemic changes. Ventricles, sulci and cisterns are prominent consistent with age related involutional changes. No acute intracranial hemorrhage, mass effect or shift. No hydrocephalus. Vascular: No hyperdense vessel or unexpected calcification. Skull: Normal. Negative for fracture or focal lesion. Sinuses/Orbits: No acute finding. IMPRESSION: Atrophy and chronic small vessel ischemic changes. No acute intracranial process identified. Electronically Signed   By: Sammie Bench M.D.   On: 04/07/2022 09:20   DG Femur Min 2 Views Right  Result Date: 04/07/2022 CLINICAL DATA:  Fall.  Deformity and foreshortening of right leg. EXAM: RIGHT FEMUR 2 VIEWS; PELVIS - 1-2 VIEW; RIGHT KNEE - 1-2 VIEW COMPARISON:  CT right knee 07/12/2016 FINDINGS: There is diffuse decreased bone mineralization. Pelvis and right femur: There is an acute fracture of the right femoral neck with mild varus angulation. Likely mild superior displacement of  the distal fracture component with respect to the proximal fracture component. Moderate superior right greater than left femoroacetabular joint space narrowing. Mild bilateral superolateral acetabular degenerative osteophytes. Mild bilateral sacroiliac subchondral sclerosis. There is mild irregularity of the superior aspect of the right pubic body which appears chronic, however recommend clinical correlation for point tenderness to exclude an acute fracture. Mild-to-moderate superior pubic symphysis joint space narrowing. Right knee: Moderate to severe medial and moderate lateral compartment joint space narrowing with mild peripheral degenerative osteophytes. No lateral view was achieved, and on limited oblique views there appears to be at least mild patellofemoral joint space narrowing and peripheral osteophytosis. There is a small sclerotic focus within the proximal fibula  which is similar to prior CT and compatible with a benign enchondroma. No definite knee joint effusion. IMPRESSION: 1. Acute fracture of the right femoral neck with mild varus angulation. 2. There is mild irregularity of the superior aspect of the right pubic body which appears chronic, however recommend clinical correlation for point tenderness to exclude an acute fracture. Electronically Signed   By: Yvonne Kendall M.D.   On: 04/07/2022 09:06   DG Pelvis 1-2 Views  Result Date: 04/07/2022 CLINICAL DATA:  Fall.  Deformity and foreshortening of right leg. EXAM: RIGHT FEMUR 2 VIEWS; PELVIS - 1-2 VIEW; RIGHT KNEE - 1-2 VIEW COMPARISON:  CT right knee 07/12/2016 FINDINGS: There is diffuse decreased bone mineralization. Pelvis and right femur: There is an acute fracture of the right femoral neck with mild varus angulation. Likely mild superior displacement of the distal fracture component with respect to the proximal fracture component. Moderate superior right greater than left femoroacetabular joint space narrowing. Mild bilateral superolateral acetabular degenerative osteophytes. Mild bilateral sacroiliac subchondral sclerosis. There is mild irregularity of the superior aspect of the right pubic body which appears chronic, however recommend clinical correlation for point tenderness to exclude an acute fracture. Mild-to-moderate superior pubic symphysis joint space narrowing. Right knee: Moderate to severe medial and moderate lateral compartment joint space narrowing with mild peripheral degenerative osteophytes. No lateral view was achieved, and on limited oblique views there appears to be at least mild patellofemoral joint space narrowing and peripheral osteophytosis. There is a small sclerotic focus within the proximal fibula which is similar to prior CT and compatible with a benign enchondroma. No definite knee joint effusion. IMPRESSION: 1. Acute fracture of the right femoral neck with mild varus angulation. 2.  There is mild irregularity of the superior aspect of the right pubic body which appears chronic, however recommend clinical correlation for point tenderness to exclude an acute fracture. Electronically Signed   By: Yvonne Kendall M.D.   On: 04/07/2022 09:06   DG Knee 1-2 Views Right  Result Date: 04/07/2022 CLINICAL DATA:  Fall.  Deformity and foreshortening of right leg. EXAM: RIGHT FEMUR 2 VIEWS; PELVIS - 1-2 VIEW; RIGHT KNEE - 1-2 VIEW COMPARISON:  CT right knee 07/12/2016 FINDINGS: There is diffuse decreased bone mineralization. Pelvis and right femur: There is an acute fracture of the right femoral neck with mild varus angulation. Likely mild superior displacement of the distal fracture component with respect to the proximal fracture component. Moderate superior right greater than left femoroacetabular joint space narrowing. Mild bilateral superolateral acetabular degenerative osteophytes. Mild bilateral sacroiliac subchondral sclerosis. There is mild irregularity of the superior aspect of the right pubic body which appears chronic, however recommend clinical correlation for point tenderness to exclude an acute fracture. Mild-to-moderate superior pubic symphysis joint space narrowing. Right knee: Moderate to  severe medial and moderate lateral compartment joint space narrowing with mild peripheral degenerative osteophytes. No lateral view was achieved, and on limited oblique views there appears to be at least mild patellofemoral joint space narrowing and peripheral osteophytosis. There is a small sclerotic focus within the proximal fibula which is similar to prior CT and compatible with a benign enchondroma. No definite knee joint effusion. IMPRESSION: 1. Acute fracture of the right femoral neck with mild varus angulation. 2. There is mild irregularity of the superior aspect of the right pubic body which appears chronic, however recommend clinical correlation for point tenderness to exclude an acute fracture.  Electronically Signed   By: Yvonne Kendall M.D.   On: 04/07/2022 09:06   DG Chest 1 View  Result Date: 04/07/2022 CLINICAL DATA:  Fall.  Deformity and foreshortening of right leg. EXAM: CHEST  1 VIEW COMPARISON:  AP chest 07/12/2016 FINDINGS: Cardiac silhouette and mediastinal contours are unchanged and within normal limits with mild calcification within the aortic arch. Mild bilateral interstitial thickening is similar to prior and chronic. No focal airspace opacity. No pleural effusion or pneumothorax. No definite acute displaced rib fracture is seen. IMPRESSION: 1. No acute cardiopulmonary process. 2. Chronic interstitial thickening. 3. No definite acute displaced rib fracture. Electronically Signed   By: Yvonne Kendall M.D.   On: 04/07/2022 09:02    Procedures Procedures    Medications Ordered in ED Medications - No data to display  ED Course/ Medical Decision Making/ A&P                          Medical Decision Making Amount and/or Complexity of Data Reviewed Labs: ordered. Decision-making details documented in ED Course. Radiology: ordered. Decision-making details documented in ED Course.  Risk Decision regarding hospitalization.    This patient presents to the ED for concern of fall, hip pain, this involves an extensive number of treatment options, and is a complaint that carries with it a high risk of complications and morbidity.  I considered the following differential and admission for this acute, potentially life threatening condition.   MDM:    DDX for trauma includes but is not limited to:  -Head Injury such as skull fx or ICH -patient with dementia with an unwitnessed fall, cannot tell me if she hit her head.  Though she has no evidence of trauma on exam and denies any headache given her age and comorbidities as well as the fact that she takes Eliquis will obtain a CT head without contrast. -Chest Injury and Abdominal Injury - patient has no chest or abdominal tenderness  palpation or signs of trauma on exam.  Will obtain a chest x-ray for screening given report of fall but low concern for intrathoracic or intra-abdominal injuries. -Spinal Cord or Vertebral injury - patient has no new deficits, her neuro exam is intact. She is complaining of no neck pain, her exam is normal, very low c/f C-spine injury -Hemorrhage  -patient has no signs of hemorrhage on exam and she is not tachycardic or hypotensive, low concern for hemorrhage -With patient's right hip pain consider hip fracture, dislocation.  She has a "knot "or a circumscribed mobile mass palpated in her right anterior femur that patient is not sure if it is new.  This posterior presents a small hematoma however feels on exam more consistent with a lipoma.  Will initially evaluate with x-rays to ensure that it is not a bony deformity or fracture of the  femur, but I think this is less likely.  Otherwise her compartments of her right lower extremity are soft and she has intact peripheral pulses, no concern for compartment syndrome.  -Patient is a very poor historian and will obtain labs including UA, BMP, CBC, and CK to evaluate possible causes of fall, anemia, electrolyte derangements, UTI or rhabdomyolysis from laying on the floor   Clinical Course as of 04/07/22 1126  Tue Apr 07, 2022  1047 CK Total: 97 [HN]  5790 Basic metabolic panel(!) Unremarkable [HN]  1047 CBC with Differential(!) Slightly volume contracted [HN]  1047 Glucose-Capillary: 83 [HN]  1047 Urinalysis, Routine w reflex microscopic Urine, Clean Catch No UTI [HN]  1048 CT Head Wo Contrast IMPRESSION: Atrophy and chronic small vessel ischemic changes. No acute intracranial process identified.   [HN]  1048 DG Femur Min 2 Views Right 1. Acute fracture of the right femoral neck with mild varus angulation. 2. There is mild irregularity of the superior aspect of the right pubic body which appears chronic, however recommend clinical correlation  for point tenderness to exclude an acute fracture.   [HN]  1048 DG Chest 1 View 1. No acute cardiopulmonary process. 2. Chronic interstitial thickening. 3. No definite acute displaced rib fracture.   [HN]  1050 R femoral neck fracture. Consulted to ortho and medicine for admission. [HN]  1126 Patient admitted to medicine  [HN]    Clinical Course User Index [HN] Audley Hose, MD    Labs: I Ordered, and personally interpreted labs.  The pertinent results include: Those listed above  Imaging Studies ordered: I ordered imaging studies including CT head, chest x-ray, lower extremity and pelvis x-rays I independently visualized and interpreted imaging. I agree with the radiologist interpretation  Additional history obtained from chart review.    Cardiac Monitoring: The patient was maintained on a cardiac monitor.  I personally viewed and interpreted the cardiac monitored which showed an underlying rhythm of: Normal sinus rhythm  Reevaluation: After the interventions noted above, I reevaluated the patient and found that they have :stayed the same  Social Determinants of Health:  patient with dementia and lives at a facility  Disposition:  Admit to medicine  Co morbidities that complicate the patient evaluation  Past Medical History:  Diagnosis Date   Alzheimer disease ([place])    Depression    GERD (gastroesophageal reflux disease)    Hyperlipidemia    Hypertension      Medicines No orders of the defined types were placed in this encounter.   I have reviewed the patients home medicines and have made adjustments as needed  Problem List / ED Course: Problem List Items Addressed This Visit   None Visit Diagnoses     Closed displaced fracture of right femoral neck ([place])    -  Primary                   This note was created using dictation software, which may contain spelling or grammatical errors.    Audley Hose, MD 04/07/22 1126

## 2022-04-07 NOTE — H&P (Signed)
History and Physical    Patient: Teresa Francis ZDG:[id number] DOB: 01/11/1941 DOA: 04/07/2022 DOS: the patient was seen and examined on 04/07/2022 PCP: Jearld Fenton, NP  Patient coming from: [facility]; NOK: Daughter, Bevelyn Francis, [phone]   Chief Complaint: Fall  HPI: Teresa Francis is a 82 y.o. female with medical history significant of dementia, HTN, and HLD presenting with a fall.  She reportedly had an unwitnessed fall at her facility.  I was unable to reach her daughter by telephone at the time of admission, left a message.  The patient is alert and pleasant but oriented only to person.    ER Course:  Dementia, fall at facility.  R femoral neck fracture.  Labs ok.     Review of Systems: unable to review all systems due to the inability of the patient to answer questions. Past Medical History:  Diagnosis Date   Alzheimer disease ([place])    Depression    GERD (gastroesophageal reflux disease)    Hyperlipidemia    Hypertension    Past Surgical History:  Procedure Laterality Date   CHOLECYSTECTOMY  2016   TUBAL LIGATION  1978   Social History:  reports that she has never smoked. She has never used smokeless tobacco. She reports that she does not drink alcohol and does not use drugs.  Allergies  Allergen Reactions   Lisinopril Cough    Family History  Problem Relation Age of Onset   Heart disease Mother    Hypertension Mother    Arthritis Father    Cancer Father        Prostate   Cancer Son        Lung   Cancer Sister        Breast   Arthritis Sister    Cancer Brother        Throat   Hypertension Daughter    Diabetes Paternal Aunt    Diabetes Paternal Uncle    Diabetes Paternal Grandfather    Cancer Sister        Breast   Arthritis Sister    Cancer Sister        Breast   Arthritis Sister    Cancer Brother        Prostate   Diabetes Daughter        Uterine   Stroke Neg Hx     Prior to Admission medications   Medication Sig Start  Date End Date Taking? Authorizing Provider  acetaminophen (TYLENOL) 325 MG tablet Take 2 tablets (650 mg total) by mouth every 6 (six) hours as needed for mild pain. 07/16/16   Jill Alexanders, PA-C  amLODipine (NORVASC) 5 MG tablet Take 1 tablet (5 mg total) by mouth daily. 01/07/19   Jearld Fenton, NP  aspirin 81 MG tablet Take 81 mg by mouth daily.    [provider]  Calcium Carbonate (CALCIUM 600 PO) Take 1 capsule by mouth 2 (two) times daily.    [provider]  cholecalciferol (VITAMIN D) 1000 units tablet Take 1,000 Units by mouth daily.    [provider]  divalproex (DEPAKOTE) 250 MG DR tablet Take 1 tablet by mouth 2 (two) times daily. 12/05/18   [provider]  donepezil (ARICEPT) 10 MG tablet Take 1 tablet (10 mg total) by mouth at bedtime. 01/07/19   Jearld Fenton, NP  fenofibrate 54 MG tablet Take 1 tablet (54 mg total) by mouth daily. 01/07/19   Jearld Fenton,  NP  Melatonin 5 MG TABS Take 1 tablet by mouth at bedtime and may repeat dose one time if needed.     [provider]  memantine (NAMENDA) 5 MG tablet TAKE 1 TABLET TWICE DAILY 11/23/16   Jearld Fenton, NP  Omega-3 Fatty Acids (FISH OIL) 1000 MG CAPS Take 1 capsule by mouth daily.    [provider]  pantoprazole (PROTONIX) 40 MG tablet Take 1 tablet (40 mg total) by mouth 2 (two) times daily. 01/07/19   Jearld Fenton, NP  sertraline (ZOLOFT) 50 MG tablet Take 1 tablet (50 mg total) by mouth daily. 01/07/19   Jearld Fenton, NP  simvastatin (ZOCOR) 40 MG tablet Take 1 tablet (40 mg total) by mouth daily at 6 PM. 01/07/19   Baity, Coralie Keens, NP  vitamin B-12 (CYANOCOBALAMIN) 1000 MCG tablet Take 1,000 mcg by mouth daily.    [provider]  vitamin E 400 UNIT capsule Take 400 Units by mouth daily.    [provider]    Physical Exam: Vitals:   04/07/22 1012 04/07/22 1145 04/07/22 1200 04/07/22 1257  BP: (!) 168/66 (!) 183/84 (!) 167/73 (!)  178/74  Pulse: 67 70 69 67  Resp: '16 12 15 17  '$ Temp:    98.2 F (36.8 C)  TempSrc:    Oral  SpO2: 96% 100% 100% 96%   General:  Appears calm and comfortable and is in NAD; hip pain with movement, pleasantly confused Eyes:   EOMI, normal lids, iris ENT: hard of hearing,  grossly normal lips & tongue, mmm Neck:  no LAD, masses or thyromegaly Cardiovascular:  RRR, no m/r/g. No LE edema.  Respiratory:   CTA bilaterally with no wheezes/rales/rhonchi.  Normal respiratory effort.   Abdomen:  soft, NT, ND Skin:  no rash or induration seen on limited exam Musculoskeletal:  R leg is shortened and externally rotated Lower extremity:  No LE edema.  Limited foot exam with no ulcerations.  2+ distal pulses. Psychiatric:  pleasantly confused mood and affect, speech sparse but appropriate, A&O x 1 Neurologic:  CN 2-12 grossly intact, moves all extremities in coordinated fashion other than RLE   Radiological Exams on Admission: Independently reviewed - see discussion in A/P where applicable  CT Head Wo Contrast  Result Date: 04/07/2022 CLINICAL DATA:  Head trauma. EXAM: CT HEAD WITHOUT CONTRAST TECHNIQUE: Contiguous axial images were obtained from the base of the skull through the vertex without intravenous contrast. RADIATION DOSE REDUCTION: This exam was performed according to the departmental dose-optimization program which includes automated exposure control, adjustment of the mA and/or kV according to patient size and/or use of iterative reconstruction technique. COMPARISON:  07/11/2016. FINDINGS: Brain: There is periventricular white matter decreased attenuation consistent with small vessel ischemic changes. Ventricles, sulci and cisterns are prominent consistent with age related involutional changes. No acute intracranial hemorrhage, mass effect or shift. No hydrocephalus. Vascular: No hyperdense vessel or unexpected calcification. Skull: Normal. Negative for fracture or focal lesion.  Sinuses/Orbits: No acute finding. IMPRESSION: Atrophy and chronic small vessel ischemic changes. No acute intracranial process identified. Electronically Signed   By: Sammie Bench M.D.   On: 04/07/2022 09:20   DG Femur Min 2 Views Right  Result Date: 04/07/2022 CLINICAL DATA:  Fall.  Deformity and foreshortening of right leg. EXAM: RIGHT FEMUR 2 VIEWS; PELVIS - 1-2 VIEW; RIGHT KNEE - 1-2 VIEW COMPARISON:  CT right knee 07/12/2016 FINDINGS: There is diffuse decreased bone mineralization. Pelvis and right femur: There  is an acute fracture of the right femoral neck with mild varus angulation. Likely mild superior displacement of the distal fracture component with respect to the proximal fracture component. Moderate superior right greater than left femoroacetabular joint space narrowing. Mild bilateral superolateral acetabular degenerative osteophytes. Mild bilateral sacroiliac subchondral sclerosis. There is mild irregularity of the superior aspect of the right pubic body which appears chronic, however recommend clinical correlation for point tenderness to exclude an acute fracture. Mild-to-moderate superior pubic symphysis joint space narrowing. Right knee: Moderate to severe medial and moderate lateral compartment joint space narrowing with mild peripheral degenerative osteophytes. No lateral view was achieved, and on limited oblique views there appears to be at least mild patellofemoral joint space narrowing and peripheral osteophytosis. There is a small sclerotic focus within the proximal fibula which is similar to prior CT and compatible with a benign enchondroma. No definite knee joint effusion. IMPRESSION: 1. Acute fracture of the right femoral neck with mild varus angulation. 2. There is mild irregularity of the superior aspect of the right pubic body which appears chronic, however recommend clinical correlation for point tenderness to exclude an acute fracture. Electronically Signed   By: Yvonne Kendall  M.D.   On: 04/07/2022 09:06   DG Pelvis 1-2 Views  Result Date: 04/07/2022 CLINICAL DATA:  Fall.  Deformity and foreshortening of right leg. EXAM: RIGHT FEMUR 2 VIEWS; PELVIS - 1-2 VIEW; RIGHT KNEE - 1-2 VIEW COMPARISON:  CT right knee 07/12/2016 FINDINGS: There is diffuse decreased bone mineralization. Pelvis and right femur: There is an acute fracture of the right femoral neck with mild varus angulation. Likely mild superior displacement of the distal fracture component with respect to the proximal fracture component. Moderate superior right greater than left femoroacetabular joint space narrowing. Mild bilateral superolateral acetabular degenerative osteophytes. Mild bilateral sacroiliac subchondral sclerosis. There is mild irregularity of the superior aspect of the right pubic body which appears chronic, however recommend clinical correlation for point tenderness to exclude an acute fracture. Mild-to-moderate superior pubic symphysis joint space narrowing. Right knee: Moderate to severe medial and moderate lateral compartment joint space narrowing with mild peripheral degenerative osteophytes. No lateral view was achieved, and on limited oblique views there appears to be at least mild patellofemoral joint space narrowing and peripheral osteophytosis. There is a small sclerotic focus within the proximal fibula which is similar to prior CT and compatible with a benign enchondroma. No definite knee joint effusion. IMPRESSION: 1. Acute fracture of the right femoral neck with mild varus angulation. 2. There is mild irregularity of the superior aspect of the right pubic body which appears chronic, however recommend clinical correlation for point tenderness to exclude an acute fracture. Electronically Signed   By: Yvonne Kendall M.D.   On: 04/07/2022 09:06   DG Knee 1-2 Views Right  Result Date: 04/07/2022 CLINICAL DATA:  Fall.  Deformity and foreshortening of right leg. EXAM: RIGHT FEMUR 2 VIEWS; PELVIS - 1-2  VIEW; RIGHT KNEE - 1-2 VIEW COMPARISON:  CT right knee 07/12/2016 FINDINGS: There is diffuse decreased bone mineralization. Pelvis and right femur: There is an acute fracture of the right femoral neck with mild varus angulation. Likely mild superior displacement of the distal fracture component with respect to the proximal fracture component. Moderate superior right greater than left femoroacetabular joint space narrowing. Mild bilateral superolateral acetabular degenerative osteophytes. Mild bilateral sacroiliac subchondral sclerosis. There is mild irregularity of the superior aspect of the right pubic body which appears chronic, however recommend clinical correlation for  point tenderness to exclude an acute fracture. Mild-to-moderate superior pubic symphysis joint space narrowing. Right knee: Moderate to severe medial and moderate lateral compartment joint space narrowing with mild peripheral degenerative osteophytes. No lateral view was achieved, and on limited oblique views there appears to be at least mild patellofemoral joint space narrowing and peripheral osteophytosis. There is a small sclerotic focus within the proximal fibula which is similar to prior CT and compatible with a benign enchondroma. No definite knee joint effusion. IMPRESSION: 1. Acute fracture of the right femoral neck with mild varus angulation. 2. There is mild irregularity of the superior aspect of the right pubic body which appears chronic, however recommend clinical correlation for point tenderness to exclude an acute fracture. Electronically Signed   By: Yvonne Kendall M.D.   On: 04/07/2022 09:06   DG Chest 1 View  Result Date: 04/07/2022 CLINICAL DATA:  Fall.  Deformity and foreshortening of right leg. EXAM: CHEST  1 VIEW COMPARISON:  AP chest 07/12/2016 FINDINGS: Cardiac silhouette and mediastinal contours are unchanged and within normal limits with mild calcification within the aortic arch. Mild bilateral interstitial thickening  is similar to prior and chronic. No focal airspace opacity. No pleural effusion or pneumothorax. No definite acute displaced rib fracture is seen. IMPRESSION: 1. No acute cardiopulmonary process. 2. Chronic interstitial thickening. 3. No definite acute displaced rib fracture. Electronically Signed   By: Yvonne Kendall M.D.   On: 04/07/2022 09:02    EKG: Independently reviewed.  NSR with rate 68; nonspecific ST changes with no evidence of acute ischemia   Labs on Admission: I have personally reviewed the available labs and imaging studies at the time of the admission.  Pertinent labs:    Normal BMP CK 97 Unremarkable CBC Normal UA   Assessment and Plan: Principal Problem:   Hip fracture ([place]) Active Problems:   HLD (hyperlipidemia)   Essential hypertension   Alzheimer's dementia (HCC)    Hip fracture -Apparently mechanical fall resulting in hip fracture -Orthopedics consulted -NPO in anticipation of surgical repair today vs. tomorrow -SCDs overnight, start Lovenox post-operatively (or as per ortho) -Pain control with Tylenol, Robaxin, Oxycodone, and Morphine prn -TOC team consult for rehab placement -Will need PT consult post-operatively -Hip fracture order set utilized -TXA per orthopedics -Fascia iliacus block ordered per anesthesia  Pre-operative stratification -Orthopedic/spinal surgery is associated with an intermediate (1-5%) cardiovascular risk for cardiac death and nonfatal MI -She has no known h/o CAD or CHF although she is on ASA daily -It is reasonable for her to go to the OR without additional evaluation  Dementia -Previously on Aricept, now on Namenda monotherapy per Med Rec - will continue -She is also on Depakote BID, sertraline, and melatonin  HTN -Continue amlodipine, HCTZ  HLD -She does not appear to be taking medications for this issue at this time    *Note: she has been on Rocephin at her facility.  Per the Pharmacy Tech, doing instructions are  "inject 1gm into the muscle one time a day for infection related to chronic kidney disease."  She has apparently received 2 days of 4.  Will hold at this time and monitor clinically.      Advance Care Planning:   Code Status: DNR - form present at bedside  Consults: Orthopedics; SW, Nutrition; will need PT post-operatively   DVT Prophylaxis: SCDs until approved for Lovenox by orthopedics  Family Communication: None present; I attempted to reach her daughter by telephone and left a message at the time  of admission  Severity of Illness: The appropriate patient status for this patient is INPATIENT. Inpatient status is judged to be reasonable and necessary in order to provide the required intensity of service to ensure the patient's safety. The patient's presenting symptoms, physical exam findings, and initial radiographic and laboratory data in the context of their chronic comorbidities is felt to place them at high risk for further clinical deterioration. Furthermore, it is not anticipated that the patient will be medically stable for discharge from the hospital within 2 midnights of admission.   * I certify that at the point of admission it is my clinical judgment that the patient will require inpatient hospital care spanning beyond 2 midnights from the point of admission due to high intensity of service, high risk for further deterioration and high frequency of surveillance required.*  Author: Karmen Bongo, MD 04/07/2022 1:46 PM  For on call review [email or web address].

## 2022-04-07 NOTE — Progress Notes (Signed)
Was able to contact family member and do admission

## 2022-04-07 NOTE — H&P (View-Only) (Signed)
Reason for Consult:Right hip fx Referring Physician: Cindee Lame Time called: 0630 Time at bedside: Teresa Francis is an 82 y.o. female.  HPI: [facility] the [facility]. She c/o right hip pain and was sent to the ED for evaluation. X-rays showed a right hip fx and orthopedic surgery was consulted. She is demented and cannot contribute meaningfully to history.  Past Medical History:  Diagnosis Date   Alzheimer disease ([place])    Depression    GERD (gastroesophageal reflux disease)    Hyperlipidemia    Hypertension     Past Surgical History:  Procedure Laterality Date   CHOLECYSTECTOMY  2016   TUBAL LIGATION  1978    Family History  Problem Relation Age of Onset   Heart disease Mother    Hypertension Mother    Arthritis Father    Cancer Father        Prostate   Cancer Son        Lung   Cancer Sister        Breast   Arthritis Sister    Cancer Brother        Throat   Hypertension Daughter    Diabetes Paternal Aunt    Diabetes Paternal Uncle    Diabetes Paternal Grandfather    Cancer Sister        Breast   Arthritis Sister    Cancer Sister        Breast   Arthritis Sister    Cancer Brother        Prostate   Diabetes Daughter        Uterine   Stroke Neg Hx     Social History:  reports that she has never smoked. She has never used smokeless tobacco. She reports that she does not drink alcohol and does not use drugs.  Allergies:  Allergies  Allergen Reactions   Lisinopril Cough    Medications: I have reviewed the patient's current medications.  Results for orders placed or performed during the hospital encounter of 04/07/22 (from the past 48 hour(s))  POC CBG, ED     Status: None   Collection Time: 04/07/22  8:02 AM  Result Value Ref Range   Glucose-Capillary 83 70 - 99 mg/dL    Comment: Glucose reference range applies only to samples taken after fasting for at least 8 hours.  CBC with Differential     Status: Abnormal    Collection Time: 04/07/22  8:22 AM  Result Value Ref Range   WBC 9.2 4.0 - 10.5 K/uL   RBC 5.12 (H) 3.87 - 5.11 MIL/uL   Hemoglobin 15.5 (H) 12.0 - 15.0 g/dL   HCT 46.0 36.0 - 46.0 %   MCV 89.8 80.0 - 100.0 fL   MCH 30.3 26.0 - 34.0 pg   MCHC 33.7 30.0 - 36.0 g/dL   RDW 13.7 11.5 - 15.5 %   Platelets 287 150 - 400 K/uL   nRBC 0.0 0.0 - 0.2 %   Neutrophils Relative % 72 %   Neutro Abs 6.6 1.7 - 7.7 K/uL   Lymphocytes Relative 19 %   Lymphs Abs 1.7 0.7 - 4.0 K/uL   Monocytes Relative 9 %   Monocytes Absolute 0.8 0.1 - 1.0 K/uL   Eosinophils Relative 0 %   Eosinophils Absolute 0.0 0.0 - 0.5 K/uL   Basophils Relative 0 %   Basophils Absolute 0.0 0.0 - 0.1 K/uL   Immature Granulocytes 0 %  Abs Immature Granulocytes 0.04 0.00 - 0.07 K/uL    Comment: Performed at [facility], [place] [address], [place], [place] [address]  Basic metabolic panel     Status: Abnormal   Collection Time: 04/07/22  8:22 AM  Result Value Ref Range   Sodium 137 135 - 145 mmol/L   Potassium 3.6 3.5 - 5.1 mmol/L   Chloride 95 (L) 98 - 111 mmol/L   CO2 32 22 - 32 mmol/L   Glucose, Bld 96 70 - 99 mg/dL    Comment: Glucose reference range applies only to samples taken after fasting for at least 8 hours.   BUN 17 8 - 23 mg/dL   Creatinine, Ser 0.63 0.44 - 1.00 mg/dL   Calcium 9.4 8.9 - 10.3 mg/dL   GFR, Estimated >60 >60 mL/min    Comment: (NOTE) Calculated using the CKD-EPI Creatinine Equation (2021)    Anion gap 10 5 - 15    Comment: Performed at [facility] [address], [place], [place] [address]  CK     Status: None   Collection Time: 04/07/22  8:22 AM  Result Value Ref Range   Total CK 97 38 - 234 U/L    Comment: Performed at [facility], [place] [address], [place], [place] [address]  Urinalysis, Routine w reflex microscopic Urine, Clean Catch     Status: None   Collection Time: 04/07/22 10:00 AM  Result Value Ref Range   Color, Urine YELLOW YELLOW   APPearance CLEAR CLEAR    Specific Gravity, Urine 1.011 1.005 - 1.030   pH 8.0 5.0 - 8.0   Glucose, UA NEGATIVE NEGATIVE mg/dL   Hgb urine dipstick NEGATIVE NEGATIVE   Bilirubin Urine NEGATIVE NEGATIVE   Ketones, ur NEGATIVE NEGATIVE mg/dL   Protein, ur NEGATIVE NEGATIVE mg/dL   Nitrite NEGATIVE NEGATIVE   Leukocytes,Ua NEGATIVE NEGATIVE    Comment: Performed at [facility] [address] [place], [place] [address]    CT Head Wo Contrast  Result Date: 04/07/2022 CLINICAL DATA:  Head trauma. EXAM: CT HEAD WITHOUT CONTRAST TECHNIQUE: Contiguous axial images were obtained from the base of the skull through the vertex without intravenous contrast. RADIATION DOSE REDUCTION: This exam was performed according to the departmental dose-optimization program which includes automated exposure control, adjustment of the mA and/or kV according to patient size and/or use of iterative reconstruction technique. COMPARISON:  07/11/2016. FINDINGS: Brain: There is periventricular white matter decreased attenuation consistent with small vessel ischemic changes. Ventricles, sulci and cisterns are prominent consistent with age related involutional changes. No acute intracranial hemorrhage, mass effect or shift. No hydrocephalus. Vascular: No hyperdense vessel or unexpected calcification. Skull: Normal. Negative for fracture or focal lesion. Sinuses/Orbits: No acute finding. IMPRESSION: Atrophy and chronic small vessel ischemic changes. No acute intracranial process identified. Electronically Signed   By: Sammie Bench M.D.   On: 04/07/2022 09:20   DG Femur Min 2 Views Right  Result Date: 04/07/2022 CLINICAL DATA:  Fall.  Deformity and foreshortening of right leg. EXAM: RIGHT FEMUR 2 VIEWS; PELVIS - 1-2 VIEW; RIGHT KNEE - 1-2 VIEW COMPARISON:  CT right knee 07/12/2016 FINDINGS: There is diffuse decreased bone mineralization. Pelvis and right femur: There is an acute fracture of the right femoral neck with mild varus angulation.  Likely mild superior displacement of the distal fracture component with respect to the proximal fracture component. Moderate superior right greater than left femoroacetabular joint space narrowing. Mild bilateral superolateral acetabular  degenerative osteophytes. Mild bilateral sacroiliac subchondral sclerosis. There is mild irregularity of the superior aspect of the right pubic body which appears chronic, however recommend clinical correlation for point tenderness to exclude an acute fracture. Mild-to-moderate superior pubic symphysis joint space narrowing. Right knee: Moderate to severe medial and moderate lateral compartment joint space narrowing with mild peripheral degenerative osteophytes. No lateral view was achieved, and on limited oblique views there appears to be at least mild patellofemoral joint space narrowing and peripheral osteophytosis. There is a small sclerotic focus within the proximal fibula which is similar to prior CT and compatible with a benign enchondroma. No definite knee joint effusion. IMPRESSION: 1. Acute fracture of the right femoral neck with mild varus angulation. 2. There is mild irregularity of the superior aspect of the right pubic body which appears chronic, however recommend clinical correlation for point tenderness to exclude an acute fracture. Electronically Signed   By: Yvonne Kendall M.D.   On: 04/07/2022 09:06   DG Pelvis 1-2 Views  Result Date: 04/07/2022 CLINICAL DATA:  Fall.  Deformity and foreshortening of right leg. EXAM: RIGHT FEMUR 2 VIEWS; PELVIS - 1-2 VIEW; RIGHT KNEE - 1-2 VIEW COMPARISON:  CT right knee 07/12/2016 FINDINGS: There is diffuse decreased bone mineralization. Pelvis and right femur: There is an acute fracture of the right femoral neck with mild varus angulation. Likely mild superior displacement of the distal fracture component with respect to the proximal fracture component. Moderate superior right greater than left femoroacetabular joint space  narrowing. Mild bilateral superolateral acetabular degenerative osteophytes. Mild bilateral sacroiliac subchondral sclerosis. There is mild irregularity of the superior aspect of the right pubic body which appears chronic, however recommend clinical correlation for point tenderness to exclude an acute fracture. Mild-to-moderate superior pubic symphysis joint space narrowing. Right knee: Moderate to severe medial and moderate lateral compartment joint space narrowing with mild peripheral degenerative osteophytes. No lateral view was achieved, and on limited oblique views there appears to be at least mild patellofemoral joint space narrowing and peripheral osteophytosis. There is a small sclerotic focus within the proximal fibula which is similar to prior CT and compatible with a benign enchondroma. No definite knee joint effusion. IMPRESSION: 1. Acute fracture of the right femoral neck with mild varus angulation. 2. There is mild irregularity of the superior aspect of the right pubic body which appears chronic, however recommend clinical correlation for point tenderness to exclude an acute fracture. Electronically Signed   By: Yvonne Kendall M.D.   On: 04/07/2022 09:06   DG Knee 1-2 Views Right  Result Date: 04/07/2022 CLINICAL DATA:  Fall.  Deformity and foreshortening of right leg. EXAM: RIGHT FEMUR 2 VIEWS; PELVIS - 1-2 VIEW; RIGHT KNEE - 1-2 VIEW COMPARISON:  CT right knee 07/12/2016 FINDINGS: There is diffuse decreased bone mineralization. Pelvis and right femur: There is an acute fracture of the right femoral neck with mild varus angulation. Likely mild superior displacement of the distal fracture component with respect to the proximal fracture component. Moderate superior right greater than left femoroacetabular joint space narrowing. Mild bilateral superolateral acetabular degenerative osteophytes. Mild bilateral sacroiliac subchondral sclerosis. There is mild irregularity of the superior aspect of the  right pubic body which appears chronic, however recommend clinical correlation for point tenderness to exclude an acute fracture. Mild-to-moderate superior pubic symphysis joint space narrowing. Right knee: Moderate to severe medial and moderate lateral compartment joint space narrowing with mild peripheral degenerative osteophytes. No lateral view was achieved, and on limited oblique views there  appears to be at least mild patellofemoral joint space narrowing and peripheral osteophytosis. There is a small sclerotic focus within the proximal fibula which is similar to prior CT and compatible with a benign enchondroma. No definite knee joint effusion. IMPRESSION: 1. Acute fracture of the right femoral neck with mild varus angulation. 2. There is mild irregularity of the superior aspect of the right pubic body which appears chronic, however recommend clinical correlation for point tenderness to exclude an acute fracture. Electronically Signed   By: Yvonne Kendall M.D.   On: 04/07/2022 09:06   DG Chest 1 View  Result Date: 04/07/2022 CLINICAL DATA:  Fall.  Deformity and foreshortening of right leg. EXAM: CHEST  1 VIEW COMPARISON:  AP chest 07/12/2016 FINDINGS: Cardiac silhouette and mediastinal contours are unchanged and within normal limits with mild calcification within the aortic arch. Mild bilateral interstitial thickening is similar to prior and chronic. No focal airspace opacity. No pleural effusion or pneumothorax. No definite acute displaced rib fracture is seen. IMPRESSION: 1. No acute cardiopulmonary process. 2. Chronic interstitial thickening. 3. No definite acute displaced rib fracture. Electronically Signed   By: Yvonne Kendall M.D.   On: 04/07/2022 09:02    Review of Systems  Unable to perform ROS: Dementia   Blood pressure (!) 168/66, pulse 67, temperature 98.4 F (36.9 C), temperature source Oral, resp. rate 16, SpO2 96 %. Physical Exam Constitutional:      General: She is not in acute  distress.    Appearance: She is well-developed. She is not diaphoretic.  HENT:     Head: Normocephalic and atraumatic.  Eyes:     General: No scleral icterus.       Right eye: No discharge.        Left eye: No discharge.     Conjunctiva/sclera: Conjunctivae normal.  Cardiovascular:     Rate and Rhythm: Normal rate and regular rhythm.  Pulmonary:     Effort: Pulmonary effort is normal. No respiratory distress.  Musculoskeletal:     Cervical back: Normal range of motion.     Comments: RLE No traumatic wounds, ecchymosis, or rash  Mild TTP hip  No knee or ankle effusion  Knee stable to varus/ valgus and anterior/posterior stress  Sens DPN, SPN, TN intact  Motor EHL, ext, flex, evers 5/5  DP 2+, PT 2+, No significant edema  Skin:    General: Skin is warm and dry.  Neurological:     Mental Status: She is alert.  Psychiatric:        Mood and Affect: Mood normal.        Behavior: Behavior normal.    Assessment/Plan: Right hip fx -- Plan hip hemi, tomorrow with Dr. Zachery Dakins. Please keep NPO after MN.    Lisette Abu, PA-C Orthopedic Surgery [phone] 04/07/2022, 11:01 AM

## 2022-04-07 NOTE — Consult Note (Signed)
Reason for Consult:Right hip fx Referring Physician: Cindee Lame Time called: [id number] Time at bedside: Teresa Francis is an 82 y.o. female.  HPI: [facility] fell at the [facility] night. She c/o right hip pain and was sent to the ED for evaluation. X-rays showed a right hip fx and orthopedic surgery was consulted. She is demented and cannot contribute meaningfully to history.  Past Medical History:  Diagnosis Date   Alzheimer disease ([place])    Depression    GERD (gastroesophageal reflux disease)    Hyperlipidemia    Hypertension     Past Surgical History:  Procedure Laterality Date   CHOLECYSTECTOMY  2016   TUBAL LIGATION  1978    Family History  Problem Relation Age of Onset   Heart disease Mother    Hypertension Mother    Arthritis Father    Cancer Father        Prostate   Cancer Son        Lung   Cancer Sister        Breast   Arthritis Sister    Cancer Brother        Throat   Hypertension Daughter    Diabetes Paternal Aunt    Diabetes Paternal Uncle    Diabetes Paternal Grandfather    Cancer Sister        Breast   Arthritis Sister    Cancer Sister        Breast   Arthritis Sister    Cancer Brother        Prostate   Diabetes Daughter        Uterine   Stroke Neg Hx     Social History:  reports that she has never smoked. She has never used smokeless tobacco. She reports that she does not drink alcohol and does not use drugs.  Allergies:  Allergies  Allergen Reactions   Lisinopril Cough    Medications: I have reviewed the patient's current medications.  Results for orders placed or performed during the hospital encounter of 04/07/22 (from the past 48 hour(s))  POC CBG, ED     Status: None   Collection Time: 04/07/22  8:02 AM  Result Value Ref Range   Glucose-Capillary 83 70 - 99 mg/dL    Comment: Glucose reference range applies only to samples taken after fasting for at least 8 hours.  CBC with Differential     Status: Abnormal    Collection Time: 04/07/22  8:22 AM  Result Value Ref Range   WBC 9.2 4.0 - 10.5 K/uL   RBC 5.12 (H) 3.87 - 5.11 MIL/uL   Hemoglobin 15.5 (H) 12.0 - 15.0 g/dL   HCT 46.0 36.0 - 46.0 %   MCV 89.8 80.0 - 100.0 fL   MCH 30.3 26.0 - 34.0 pg   MCHC 33.7 30.0 - 36.0 g/dL   RDW 13.7 11.5 - 15.5 %   Platelets 287 150 - 400 K/uL   nRBC 0.0 0.0 - 0.2 %   Neutrophils Relative % 72 %   Neutro Abs 6.6 1.7 - 7.7 K/uL   Lymphocytes Relative 19 %   Lymphs Abs 1.7 0.7 - 4.0 K/uL   Monocytes Relative 9 %   Monocytes Absolute 0.8 0.1 - 1.0 K/uL   Eosinophils Relative 0 %   Eosinophils Absolute 0.0 0.0 - 0.5 K/uL   Basophils Relative 0 %   Basophils Absolute 0.0 0.0 - 0.1 K/uL   Immature Granulocytes 0 %  Abs Immature Granulocytes 0.04 0.00 - 0.07 K/uL    Comment: Performed at [facility], [place] [address] [place], [place] [address]  Basic metabolic panel     Status: Abnormal   Collection Time: 04/07/22  8:22 AM  Result Value Ref Range   Sodium 137 135 - 145 mmol/L   Potassium 3.6 3.5 - 5.1 mmol/L   Chloride 95 (L) 98 - 111 mmol/L   CO2 32 22 - 32 mmol/L   Glucose, Bld 96 70 - 99 mg/dL    Comment: Glucose reference range applies only to samples taken after fasting for at least 8 hours.   BUN 17 8 - 23 mg/dL   Creatinine, Ser 0.63 0.44 - 1.00 mg/dL   Calcium 9.4 8.9 - 10.3 mg/dL   GFR, Estimated >60 >60 mL/min    Comment: (NOTE) Calculated using the CKD-EPI Creatinine Equation (2021)    Anion gap 10 5 - 15    Comment: Performed at [facility] [address], [place], [place] [address]  CK     Status: None   Collection Time: 04/07/22  8:22 AM  Result Value Ref Range   Total CK 97 38 - 234 U/L    Comment: Performed at [facility], [place] [address], [place], [place] [address]  Urinalysis, Routine w reflex microscopic Urine, Clean Catch     Status: None   Collection Time: 04/07/22 10:00 AM  Result Value Ref Range   Color, Urine YELLOW YELLOW   APPearance CLEAR CLEAR    Specific Gravity, Urine 1.011 1.005 - 1.030   pH 8.0 5.0 - 8.0   Glucose, UA NEGATIVE NEGATIVE mg/dL   Hgb urine dipstick NEGATIVE NEGATIVE   Bilirubin Urine NEGATIVE NEGATIVE   Ketones, ur NEGATIVE NEGATIVE mg/dL   Protein, ur NEGATIVE NEGATIVE mg/dL   Nitrite NEGATIVE NEGATIVE   Leukocytes,Ua NEGATIVE NEGATIVE    Comment: Performed at [facility] [address], [place],  [address]    CT Head Wo Contrast  Result Date: 04/07/2022 CLINICAL DATA:  Head trauma. EXAM: CT HEAD WITHOUT CONTRAST TECHNIQUE: Contiguous axial images were obtained from the base of the skull through the vertex without intravenous contrast. RADIATION DOSE REDUCTION: This exam was performed according to the departmental dose-optimization program which includes automated exposure control, adjustment of the mA and/or kV according to patient size and/or use of iterative reconstruction technique. COMPARISON:  07/11/2016. FINDINGS: Brain: There is periventricular white matter decreased attenuation consistent with small vessel ischemic changes. Ventricles, sulci and cisterns are prominent consistent with age related involutional changes. No acute intracranial hemorrhage, mass effect or shift. No hydrocephalus. Vascular: No hyperdense vessel or unexpected calcification. Skull: Normal. Negative for fracture or focal lesion. Sinuses/Orbits: No acute finding. IMPRESSION: Atrophy and chronic small vessel ischemic changes. No acute intracranial process identified. Electronically Signed   By: Sammie Bench M.D.   On: 04/07/2022 09:20   DG Femur Min 2 Views Right  Result Date: 04/07/2022 CLINICAL DATA:  Fall.  Deformity and foreshortening of right leg. EXAM: RIGHT FEMUR 2 VIEWS; PELVIS - 1-2 VIEW; RIGHT KNEE - 1-2 VIEW COMPARISON:  CT right knee 07/12/2016 FINDINGS: There is diffuse decreased bone mineralization. Pelvis and right femur: There is an acute fracture of the right femoral neck with mild varus angulation.  Likely mild superior displacement of the distal fracture component with respect to the proximal fracture component. Moderate superior right greater than left femoroacetabular joint space narrowing. Mild bilateral superolateral acetabular  degenerative osteophytes. Mild bilateral sacroiliac subchondral sclerosis. There is mild irregularity of the superior aspect of the right pubic body which appears chronic, however recommend clinical correlation for point tenderness to exclude an acute fracture. Mild-to-moderate superior pubic symphysis joint space narrowing. Right knee: Moderate to severe medial and moderate lateral compartment joint space narrowing with mild peripheral degenerative osteophytes. No lateral view was achieved, and on limited oblique views there appears to be at least mild patellofemoral joint space narrowing and peripheral osteophytosis. There is a small sclerotic focus within the proximal fibula which is similar to prior CT and compatible with a benign enchondroma. No definite knee joint effusion. IMPRESSION: 1. Acute fracture of the right femoral neck with mild varus angulation. 2. There is mild irregularity of the superior aspect of the right pubic body which appears chronic, however recommend clinical correlation for point tenderness to exclude an acute fracture. Electronically Signed   By: Yvonne Kendall M.D.   On: 04/07/2022 09:06   DG Pelvis 1-2 Views  Result Date: 04/07/2022 CLINICAL DATA:  Fall.  Deformity and foreshortening of right leg. EXAM: RIGHT FEMUR 2 VIEWS; PELVIS - 1-2 VIEW; RIGHT KNEE - 1-2 VIEW COMPARISON:  CT right knee 07/12/2016 FINDINGS: There is diffuse decreased bone mineralization. Pelvis and right femur: There is an acute fracture of the right femoral neck with mild varus angulation. Likely mild superior displacement of the distal fracture component with respect to the proximal fracture component. Moderate superior right greater than left femoroacetabular joint space  narrowing. Mild bilateral superolateral acetabular degenerative osteophytes. Mild bilateral sacroiliac subchondral sclerosis. There is mild irregularity of the superior aspect of the right pubic body which appears chronic, however recommend clinical correlation for point tenderness to exclude an acute fracture. Mild-to-moderate superior pubic symphysis joint space narrowing. Right knee: Moderate to severe medial and moderate lateral compartment joint space narrowing with mild peripheral degenerative osteophytes. No lateral view was achieved, and on limited oblique views there appears to be at least mild patellofemoral joint space narrowing and peripheral osteophytosis. There is a small sclerotic focus within the proximal fibula which is similar to prior CT and compatible with a benign enchondroma. No definite knee joint effusion. IMPRESSION: 1. Acute fracture of the right femoral neck with mild varus angulation. 2. There is mild irregularity of the superior aspect of the right pubic body which appears chronic, however recommend clinical correlation for point tenderness to exclude an acute fracture. Electronically Signed   By: Yvonne Kendall M.D.   On: 04/07/2022 09:06   DG Knee 1-2 Views Right  Result Date: 04/07/2022 CLINICAL DATA:  Fall.  Deformity and foreshortening of right leg. EXAM: RIGHT FEMUR 2 VIEWS; PELVIS - 1-2 VIEW; RIGHT KNEE - 1-2 VIEW COMPARISON:  CT right knee 07/12/2016 FINDINGS: There is diffuse decreased bone mineralization. Pelvis and right femur: There is an acute fracture of the right femoral neck with mild varus angulation. Likely mild superior displacement of the distal fracture component with respect to the proximal fracture component. Moderate superior right greater than left femoroacetabular joint space narrowing. Mild bilateral superolateral acetabular degenerative osteophytes. Mild bilateral sacroiliac subchondral sclerosis. There is mild irregularity of the superior aspect of the  right pubic body which appears chronic, however recommend clinical correlation for point tenderness to exclude an acute fracture. Mild-to-moderate superior pubic symphysis joint space narrowing. Right knee: Moderate to severe medial and moderate lateral compartment joint space narrowing with mild peripheral degenerative osteophytes. No lateral view was achieved, and on limited oblique views there  appears to be at least mild patellofemoral joint space narrowing and peripheral osteophytosis. There is a small sclerotic focus within the proximal fibula which is similar to prior CT and compatible with a benign enchondroma. No definite knee joint effusion. IMPRESSION: 1. Acute fracture of the right femoral neck with mild varus angulation. 2. There is mild irregularity of the superior aspect of the right pubic body which appears chronic, however recommend clinical correlation for point tenderness to exclude an acute fracture. Electronically Signed   By: Yvonne Kendall M.D.   On: 04/07/2022 09:06   DG Chest 1 View  Result Date: 04/07/2022 CLINICAL DATA:  Fall.  Deformity and foreshortening of right leg. EXAM: CHEST  1 VIEW COMPARISON:  AP chest 07/12/2016 FINDINGS: Cardiac silhouette and mediastinal contours are unchanged and within normal limits with mild calcification within the aortic arch. Mild bilateral interstitial thickening is similar to prior and chronic. No focal airspace opacity. No pleural effusion or pneumothorax. No definite acute displaced rib fracture is seen. IMPRESSION: 1. No acute cardiopulmonary process. 2. Chronic interstitial thickening. 3. No definite acute displaced rib fracture. Electronically Signed   By: Yvonne Kendall M.D.   On: 04/07/2022 09:02    Review of Systems  Unable to perform ROS: Dementia   Blood pressure (!) 168/66, pulse 67, temperature 98.4 F (36.9 C), temperature source Oral, resp. rate 16, SpO2 96 %. Physical Exam Constitutional:      General: She is not in acute  distress.    Appearance: She is well-developed. She is not diaphoretic.  HENT:     Head: Normocephalic and atraumatic.  Eyes:     General: No scleral icterus.       Right eye: No discharge.        Left eye: No discharge.     Conjunctiva/sclera: Conjunctivae normal.  Cardiovascular:     Rate and Rhythm: Normal rate and regular rhythm.  Pulmonary:     Effort: Pulmonary effort is normal. No respiratory distress.  Musculoskeletal:     Cervical back: Normal range of motion.     Comments: RLE No traumatic wounds, ecchymosis, or rash  Mild TTP hip  No knee or ankle effusion  Knee stable to varus/ valgus and anterior/posterior stress  Sens DPN, SPN, TN intact  Motor EHL, ext, flex, evers 5/5  DP 2+, PT 2+, No significant edema  Skin:    General: Skin is warm and dry.  Neurological:     Mental Status: She is alert.  Psychiatric:        Mood and Affect: Mood normal.        Behavior: Behavior normal.    Assessment/Plan: Right hip fx -- Plan hip hemi, tomorrow with Dr. Zachery Dakins. Please keep NPO after MN.    Lisette Abu, PA-C Orthopedic Surgery [phone] 04/07/2022, 11:01 AM

## 2022-04-07 NOTE — ED Triage Notes (Signed)
Ems stated,  pt fell last night around 330 and complaining of right hip pain and the Dr. Rosanna Randy her sent out for scans. She has dementia.

## 2022-04-07 NOTE — Progress Notes (Signed)
Patient is confused. Made an attempt to reach out to daughter to do admission questions but I received no answer. Left voicemail to give [place] a call back.

## 2022-04-08 ENCOUNTER — Inpatient Hospital Stay (HOSPITAL_COMMUNITY): Payer: Medicare (Managed Care)

## 2022-04-08 ENCOUNTER — Other Ambulatory Visit: Payer: Self-pay

## 2022-04-08 ENCOUNTER — Encounter (HOSPITAL_COMMUNITY): Payer: Self-pay | Admitting: Internal Medicine

## 2022-04-08 ENCOUNTER — Inpatient Hospital Stay (HOSPITAL_COMMUNITY): Payer: Medicare (Managed Care) | Admitting: Anesthesiology

## 2022-04-08 ENCOUNTER — Encounter (HOSPITAL_COMMUNITY)
Admission: EM | Disposition: A | Discharge status: Skilled Nursing Facility | Payer: Self-pay | Source: Skilled Nursing Facility | Attending: Internal Medicine

## 2022-04-08 DIAGNOSIS — F0390 Unspecified dementia without behavioral disturbance: Secondary | ICD-10-CM

## 2022-04-08 DIAGNOSIS — S72001A Fracture of unspecified part of neck of right femur, initial encounter for closed fracture: Secondary | ICD-10-CM | POA: Diagnosis not present

## 2022-04-08 DIAGNOSIS — G309 Alzheimer's disease, unspecified: Secondary | ICD-10-CM

## 2022-04-08 DIAGNOSIS — I10 Essential (primary) hypertension: Secondary | ICD-10-CM

## 2022-04-08 DIAGNOSIS — F0280 Dementia in other diseases classified elsewhere without behavioral disturbance: Secondary | ICD-10-CM | POA: Diagnosis not present

## 2022-04-08 HISTORY — PX: TOTAL HIP ARTHROPLASTY: SHX124

## 2022-04-08 LAB — CBC
HCT: 44.2 % (ref 36.0–46.0)
Hemoglobin: 14.6 g/dL (ref 12.0–15.0)
MCH: 29.9 pg (ref 26.0–34.0)
MCHC: 33 g/dL (ref 30.0–36.0)
MCV: 90.6 fL (ref 80.0–100.0)
Platelets: 292 10*3/uL (ref 150–400)
RBC: 4.88 MIL/uL (ref 3.87–5.11)
RDW: 14.1 % (ref 11.5–15.5)
WBC: 9 10*3/uL (ref 4.0–10.5)
nRBC: 0 % (ref 0.0–0.2)

## 2022-04-08 LAB — TYPE AND SCREEN
ABO/RH(D): B POS
Antibody Screen: NEGATIVE

## 2022-04-08 LAB — BASIC METABOLIC PANEL
Anion gap: 12 (ref 5–15)
BUN: 14 mg/dL (ref 8–23)
CO2: 27 mmol/L (ref 22–32)
Calcium: 8.7 mg/dL — ABNORMAL LOW (ref 8.9–10.3)
Chloride: 98 mmol/L (ref 98–111)
Creatinine, Ser: 0.84 mg/dL (ref 0.44–1.00)
GFR, Estimated: >60 mL/min (ref >60)
Glucose, Bld: 97 mg/dL (ref 70–99)
Potassium: 3.1 mmol/L — ABNORMAL LOW (ref 3.5–5.1)
Sodium: 137 mmol/L (ref 135–145)

## 2022-04-08 LAB — ABO/RH: ABO/RH(D): B POS

## 2022-04-08 LAB — MRSA NEXT GEN BY PCR, NASAL: MRSA by PCR Next Gen: NOT DETECTED

## 2022-04-08 SURGERY — ARTHROPLASTY, HIP, TOTAL,POSTERIOR APPROACH
Anesthesia: General | Site: Hip | Laterality: Right

## 2022-04-08 MED ORDER — 0.9 % SODIUM CHLORIDE (POUR BTL) OPTIME
TOPICAL | Status: DC | PRN
  Administered 2022-04-08: 1000 mL

## 2022-04-08 MED ORDER — SUGAMMADEX SODIUM 200 MG/2ML IV SOLN
INTRAVENOUS | Status: DC | PRN
  Administered 2022-04-08: 200 mg via INTRAVENOUS

## 2022-04-08 MED ORDER — POVIDONE-IODINE 10 % EX SWAB
2.0000 | Freq: Once | CUTANEOUS | Status: AC
  Administered 2022-04-08: 2 via TOPICAL

## 2022-04-08 MED ORDER — FENTANYL CITRATE (PF) 250 MCG/5ML IJ SOLN
INTRAMUSCULAR | Status: AC
  Filled 2022-04-08: qty 5

## 2022-04-08 MED ORDER — OXYCODONE HCL 5 MG/5ML PO SOLN: 5.0000 mg | Freq: Once | ORAL | Status: DC | PRN

## 2022-04-08 MED ORDER — CHLORHEXIDINE GLUCONATE 0.12 % MT SOLN: 15.0000 mL | Freq: Once | OROMUCOSAL | Status: AC

## 2022-04-08 MED ORDER — CEFAZOLIN SODIUM-DEXTROSE 2-4 GM/100ML-% IV SOLN
2.0000 g | INTRAVENOUS | Status: AC
  Administered 2022-04-08: 2 g via INTRAVENOUS
  Filled 2022-04-08: qty 100

## 2022-04-08 MED ORDER — BUPIVACAINE LIPOSOME 1.3 % IJ SUSP
INTRAMUSCULAR | Status: DC | PRN
  Administered 2022-04-08: 20 mL

## 2022-04-08 MED ORDER — LIDOCAINE 2% (20 MG/ML) 5 ML SYRINGE
INTRAMUSCULAR | Status: DC | PRN
  Administered 2022-04-08: 80 mg via INTRAVENOUS

## 2022-04-08 MED ORDER — ONDANSETRON HCL 4 MG/2ML IJ SOLN
INTRAMUSCULAR | Status: DC | PRN
  Administered 2022-04-08: 4 mg via INTRAVENOUS

## 2022-04-08 MED ORDER — LACTATED RINGERS IV SOLN: INTRAVENOUS | Status: DC

## 2022-04-08 MED ORDER — PHENYLEPHRINE 80 MCG/ML (10ML) SYRINGE FOR IV PUSH (FOR BLOOD PRESSURE SUPPORT)
PREFILLED_SYRINGE | INTRAVENOUS | Status: DC | PRN
  Administered 2022-04-08 (×2): 80 ug via INTRAVENOUS

## 2022-04-08 MED ORDER — CHLORHEXIDINE GLUCONATE 4 % EX LIQD
60.0000 mL | Freq: Once | CUTANEOUS | Status: AC
  Administered 2022-04-08: 4 via TOPICAL

## 2022-04-08 MED ORDER — PROPOFOL 500 MG/50ML IV EMUL
INTRAVENOUS | Status: DC | PRN
  Administered 2022-04-08: 25 ug/kg/min via INTRAVENOUS

## 2022-04-08 MED ORDER — FENTANYL CITRATE (PF) 100 MCG/2ML IJ SOLN
25.0000 ug | INTRAMUSCULAR | Status: DC | PRN
  Administered 2022-04-08 (×2): 25 ug via INTRAVENOUS

## 2022-04-08 MED ORDER — ONDANSETRON HCL 4 MG/2ML IJ SOLN: 4.0000 mg | Freq: Four times a day (QID) | INTRAMUSCULAR | Status: DC | PRN

## 2022-04-08 MED ORDER — SODIUM CHLORIDE (PF) 0.9 % IJ SOLN
INTRAMUSCULAR | Status: DC | PRN
  Administered 2022-04-08: 40 mL

## 2022-04-08 MED ORDER — PROPOFOL 10 MG/ML IV BOLUS
INTRAVENOUS | Status: DC | PRN
  Administered 2022-04-08: 50 mg via INTRAVENOUS
  Administered 2022-04-08: 100 mg via INTRAVENOUS

## 2022-04-08 MED ORDER — ROCURONIUM BROMIDE 10 MG/ML (PF) SYRINGE
PREFILLED_SYRINGE | INTRAVENOUS | Status: DC | PRN
  Administered 2022-04-08: 40 mg via INTRAVENOUS

## 2022-04-08 MED ORDER — TRANEXAMIC ACID-NACL 1000-0.7 MG/100ML-% IV SOLN
1000.0000 mg | INTRAVENOUS | Status: AC
  Administered 2022-04-08: 1000 mg via INTRAVENOUS
  Filled 2022-04-08: qty 100

## 2022-04-08 MED ORDER — SODIUM CHLORIDE 0.9 % IR SOLN
Status: DC | PRN
  Administered 2022-04-08: 1000 mL
  Administered 2022-04-08: 3000 mL
  Administered 2022-04-08: 500 mL

## 2022-04-08 MED ORDER — PROPOFOL 10 MG/ML IV BOLUS
INTRAVENOUS | Status: AC
  Filled 2022-04-08: qty 20

## 2022-04-08 MED ORDER — CEFAZOLIN SODIUM-DEXTROSE 2-4 GM/100ML-% IV SOLN
2.0000 g | Freq: Three times a day (TID) | INTRAVENOUS | Status: AC
  Administered 2022-04-08 – 2022-04-09 (×2): 2 g via INTRAVENOUS
  Filled 2022-04-08 (×2): qty 100

## 2022-04-08 MED ORDER — ASPIRIN 81 MG PO TBEC
81.0000 mg | DELAYED_RELEASE_TABLET | Freq: Two times a day (BID) | ORAL | Status: DC
  Administered 2022-04-09 – 2022-04-10 (×3): 81 mg via ORAL
  Filled 2022-04-08 (×3): qty 1

## 2022-04-08 MED ORDER — ORAL CARE MOUTH RINSE: 15.0000 mL | Freq: Once | OROMUCOSAL | Status: AC

## 2022-04-08 MED ORDER — FENTANYL CITRATE (PF) 250 MCG/5ML IJ SOLN
INTRAMUSCULAR | Status: DC | PRN
  Administered 2022-04-08: 50 ug via INTRAVENOUS
  Administered 2022-04-08: 100 ug via INTRAVENOUS
  Administered 2022-04-08: 50 ug via INTRAVENOUS

## 2022-04-08 MED ORDER — OXYCODONE HCL 5 MG PO TABS: 5.0000 mg | ORAL_TABLET | Freq: Once | ORAL | Status: DC | PRN

## 2022-04-08 MED ORDER — CHLORHEXIDINE GLUCONATE 0.12 % MT SOLN
OROMUCOSAL | Status: AC
  Administered 2022-04-08: 15 mL via OROMUCOSAL
  Filled 2022-04-08: qty 15

## 2022-04-08 MED ORDER — POTASSIUM CHLORIDE 10 MEQ/100ML IV SOLN
10.0000 meq | INTRAVENOUS | Status: AC
  Administered 2022-04-08 (×2): 10 meq via INTRAVENOUS
  Filled 2022-04-08 (×2): qty 100

## 2022-04-08 MED ORDER — HYDRALAZINE HCL 20 MG/ML IJ SOLN
INTRAMUSCULAR | Status: DC | PRN
  Administered 2022-04-08: 4 mg via INTRAVENOUS

## 2022-04-08 MED ORDER — BUPIVACAINE LIPOSOME 1.3 % IJ SUSP
INTRAMUSCULAR | Status: AC
  Filled 2022-04-08: qty 20

## 2022-04-08 MED ORDER — OXYCODONE HCL 5 MG PO TABS
2.5000 mg | ORAL_TABLET | ORAL | Status: DC | PRN
  Administered 2022-04-09 – 2022-04-10 (×3): 5 mg via ORAL
  Filled 2022-04-08 (×3): qty 1

## 2022-04-08 MED ORDER — DEXAMETHASONE SODIUM PHOSPHATE 10 MG/ML IJ SOLN
INTRAMUSCULAR | Status: DC | PRN
  Administered 2022-04-08: 10 mg via INTRAVENOUS

## 2022-04-08 MED ORDER — FENTANYL CITRATE (PF) 100 MCG/2ML IJ SOLN
INTRAMUSCULAR | Status: AC
  Filled 2022-04-08: qty 2

## 2022-04-08 SURGICAL SUPPLY — 58 items
BLADE SAGITTAL 25.0X1.27X90 (BLADE) ×1 IMPLANT
BRUSH FEMORAL CANAL (MISCELLANEOUS) IMPLANT
CEMENT BONE SIMPLEX SPEEDSET (Cement) IMPLANT
CHLORAPREP W/TINT 26 (MISCELLANEOUS) ×2 IMPLANT
COVER SURGICAL LIGHT HANDLE (MISCELLANEOUS) ×1 IMPLANT
DERMABOND ADVANCED .7 DNX12 (GAUZE/BANDAGES/DRESSINGS) ×1 IMPLANT
DRAPE HALF SHEET 40X57 (DRAPES) ×1 IMPLANT
DRAPE HIP W/POCKET STRL (MISCELLANEOUS) ×1 IMPLANT
DRAPE INCISE IOBAN 66X45 STRL (DRAPES) ×1 IMPLANT
DRAPE INCISE IOBAN 85X60 (DRAPES) ×1 IMPLANT
DRAPE POUCH INSTRU U-SHP 10X18 (DRAPES) ×1 IMPLANT
DRAPE U-SHAPE 47X51 STRL (DRAPES) ×2 IMPLANT
DRSG AQUACEL AG ADV 3.5X10 (GAUZE/BANDAGES/DRESSINGS) ×1 IMPLANT
ELECT BLADE 4.0 EZ CLEAN MEGAD (MISCELLANEOUS) ×1
ELECT REM PT RETURN 15FT ADLT (MISCELLANEOUS) ×1 IMPLANT
ELECTRODE BLDE 4.0 EZ CLN MEGD (MISCELLANEOUS) ×1 IMPLANT
GLOVE SRG 8 PF TXTR STRL LF DI (GLOVE) ×1 IMPLANT
GLOVE SURG ORTHO LTX SZ8 (GLOVE) ×2 IMPLANT
GLOVE SURG UNDER POLY LF SZ8 (GLOVE) ×1
GOWN STRL REUS W/ TWL LRG LVL3 (GOWN DISPOSABLE) ×1 IMPLANT
GOWN STRL REUS W/ TWL XL LVL3 (GOWN DISPOSABLE) ×2 IMPLANT
GOWN STRL REUS W/TWL LRG LVL3 (GOWN DISPOSABLE) ×1
GOWN STRL REUS W/TWL XL LVL3 (GOWN DISPOSABLE) ×2
HANDPIECE INTERPULSE COAX TIP (DISPOSABLE)
HEAD BIPOLAR LOCK UHR 28X52 (Head) IMPLANT
HEAD FEM CER V40 28 -4 (Head) IMPLANT
HOOD PEEL AWAY FLYTE STAYCOOL (MISCELLANEOUS) ×3 IMPLANT
KIT BASIN OR (CUSTOM PROCEDURE TRAY) ×1 IMPLANT
KIT TURNOVER KIT A (KITS) ×1 IMPLANT
MANIFOLD NEPTUNE II (INSTRUMENTS) ×1 IMPLANT
MARKER SKIN DUAL TIP RULER LAB (MISCELLANEOUS) ×1 IMPLANT
NDL 18GX1X1/2 (RX/OR ONLY) (NEEDLE) ×2 IMPLANT
NEEDLE 18GX1X1/2 (RX/OR ONLY) (NEEDLE) ×2 IMPLANT
NS IRRIG 1000ML POUR BTL (IV SOLUTION) ×1 IMPLANT
PACK TOTAL JOINT (CUSTOM PROCEDURE TRAY) ×1 IMPLANT
PRESSURIZER FEMORAL UNIV (MISCELLANEOUS) ×1 IMPLANT
RETRIEVER SUT HEWSON (MISCELLANEOUS) ×1 IMPLANT
SEALER BIPOLAR AQUA 6.0 (INSTRUMENTS) ×1 IMPLANT
SET HNDPC FAN SPRY TIP SCT (DISPOSABLE) IMPLANT
SET INTERPULSE LAVAGE W/TIP (ORTHOPEDIC DISPOSABLE SUPPLIES) IMPLANT
SPONGE T-LAP 18X18 ~~LOC~~+RFID (SPONGE) IMPLANT
STEM HIP ACCOLADE SZ5 37X145 (Stem) IMPLANT
SUCTION FRAZIER HANDLE 10FR (MISCELLANEOUS) ×1
SUCTION TUBE FRAZIER 10FR DISP (MISCELLANEOUS) ×1 IMPLANT
SUT BONE WAX W31G (SUTURE) ×1 IMPLANT
SUT ETHIBOND 2 V 37 (SUTURE) ×1 IMPLANT
SUT MNCRL AB 3-0 PS2 27 (SUTURE) ×1 IMPLANT
SUT STRATAFIX 1PDS 45CM VIOLET (SUTURE) ×2 IMPLANT
SUT VIC AB 0 CT1 27 (SUTURE) ×2
SUT VIC AB 0 CT1 27XBRD ANBCTR (SUTURE) ×1 IMPLANT
SUT VIC AB 2-0 CT2 27 (SUTURE) ×2 IMPLANT
SYR 20ML LL LF (SYRINGE) ×1 IMPLANT
SYR 50ML LL SCALE MARK (SYRINGE) ×1 IMPLANT
TOWEL GREEN STERILE (TOWEL DISPOSABLE) ×1 IMPLANT
TOWER CARTRIDGE SMART MIX (DISPOSABLE) IMPLANT
TRAY FOLEY MTR SLVR 16FR STAT (SET/KITS/TRAYS/PACK) IMPLANT
TUBE SUCT ARGYLE STRL (TUBING) ×1 IMPLANT
WATER STERILE IRR 1000ML POUR (IV SOLUTION) ×1 IMPLANT

## 2022-04-08 NOTE — Progress Notes (Signed)
Initial Nutrition Assessment  DOCUMENTATION CODES:   Not applicable  INTERVENTION:  - Advance as tolerated.   - Add supplements once diet is advanced.  - Add MVI q day.   NUTRITION DIAGNOSIS:   Increased nutrient needs related to hip fracture as evidenced by estimated needs.  GOAL:   Patient will meet greater than or equal to 90% of their needs  MONITOR:   PO intake, Diet advancement  REASON FOR ASSESSMENT:   Consult Hip fracture protocol  ASSESSMENT:   82 y.o. female admits related to fall. PMH includes: dementia, HTN and HLD. Pt is currently receiving medical management related to hip fracture.  Meds reviewed: colace. Labs reviewed: K low.   Pt was sleeping at time of assessment. Pt is oriented x2. Pt is currently NPO and awaiting surgical intervention. RD will continue to monitor for diet advancement. Once diet advances, RD will add supplements.   NUTRITION - FOCUSED PHYSICAL EXAM:  Unable to assess, attempt at f/u.   Diet Order:   Diet Order             Diet NPO time specified Except for: Sips with Meds  Diet effective midnight                   EDUCATION NEEDS:   Not appropriate for education at this time  Skin:  Skin Assessment: Reviewed RN Assessment  Last BM:  unknown  Height:   Ht Readings from Last 1 Encounters:  01/04/19 '5\' 9"'$  (1.753 m)    Weight:   Wt Readings from Last 1 Encounters:  01/04/19 79.4 kg    Ideal Body Weight:     BMI:  There is no height or weight on file to calculate BMI.  Estimated Nutritional Needs:   Kcal:  7371-0626 kcals  Protein:  100-120 gm  Fluid:  >/= 1.9 L  Thalia Bloodgood, RD, LDN, CNSC.

## 2022-04-08 NOTE — Transfer of Care (Signed)
Immediate Anesthesia Transfer of Care Note  Patient: Teresa Francis  Procedure(s) Performed: RIGHT HIP HEMIARTHOPLASTY (Right: Hip)  Patient Location: PACU  Anesthesia Type:General  Level of Consciousness: awake and drowsy  Airway & Oxygen Therapy: Patient Spontanous Breathing and Patient connected to nasal cannula oxygen  Post-op Assessment: Report given to RN and Post -op Vital signs reviewed and stable  Post vital signs: Reviewed and stable  Last Vitals:  Vitals Value Taken Time  BP 158/83 04/08/22 1642  Temp    Pulse 85 04/08/22 1643  Resp 15 04/08/22 1643  SpO2 95 % 04/08/22 1643  Vitals shown include unvalidated device data.  Last Pain:  Vitals:   04/08/22 1336  TempSrc: Oral  PainSc: 4          Complications: No notable events documented.

## 2022-04-08 NOTE — Interval H&P Note (Signed)
The patient has been re-examined, and the chart reviewed, and there have been no interval changes to the documented history and physical.    Plan for cemented right hip hemiarthroplasty for displaced femoral neck fracture.  The operative side was examined and the patient was confirmed to have sensation to DPN, SPN, TN intact, Motor EHL, ext, flex 5/5, and DP 2+, PT 2+, No significant edema.    The risks, benefits, and alternatives have been discussed at length with patient's daughter Bevelyn Buckles over the phone, discussed that given the patient is an memory care facility and ambulatory at baseline would benefit best from hip and knee arthroplasty which should be less invasive low risk of dislocation if she has a fall.  Will plan for cemented fixation given poor bone quality and osteoporosis history.  She expresses understanding of the plan risks, benefits agreeable to proceed..  Right hip marked. Consent has been signed.

## 2022-04-08 NOTE — Progress Notes (Signed)
Contacted daughter Darryl Lent to give a verbal phone consent for pt to have surgery this morning. Consent was signed by 2 nurses.

## 2022-04-08 NOTE — Anesthesia Preprocedure Evaluation (Signed)
Anesthesia Evaluation  Patient identified by MRN, date of birth, ID band Patient awake    Reviewed: Allergy & Precautions, H&P , NPO status , Patient's Chart, lab work & pertinent test results  Airway Mallampati: II   Neck ROM: full    Dental   Pulmonary neg pulmonary ROS   breath sounds clear to auscultation       Cardiovascular hypertension,  Rhythm:regular Rate:Normal     Neuro/Psych  PSYCHIATRIC DISORDERS  Depression   Dementia    GI/Hepatic ,GERD  ,,  Endo/Other    Renal/GU      Musculoskeletal   Abdominal   Peds  Hematology   Anesthesia Other Findings   Reproductive/Obstetrics                             Anesthesia Physical Anesthesia Plan  ASA: 3  Anesthesia Plan: General   Post-op Pain Management:    Induction: Intravenous  PONV Risk Score and Plan: 3 and Ondansetron, Dexamethasone and Treatment may vary due to age or medical condition  Airway Management Planned: Oral ETT  Additional Equipment:   Intra-op Plan:   Post-operative Plan: Extubation in OR  Informed Consent: I have reviewed the patients History and Physical, chart, labs and discussed the procedure including the risks, benefits and alternatives for the proposed anesthesia with the patient or authorized representative who has indicated his/her understanding and acceptance.     Dental advisory given  Plan Discussed with: CRNA, Anesthesiologist and Surgeon  Anesthesia Plan Comments:        Anesthesia Quick Evaluation

## 2022-04-08 NOTE — Op Note (Signed)
04/07/2022 - 04/08/2022  4:14 PM  PATIENT:  Teresa Francis   MRN: [id number]  PRE-OPERATIVE DIAGNOSIS: Right displaced femoral neck fracture  POST-OPERATIVE DIAGNOSIS: Same  PROCEDURE:  Procedure(s): Right hip cemented bipolar hemiarthroplasty Teresa Francis, RNFA  PREOPERATIVE INDICATIONS:  Teresa MASKELL is an 82 y.o. female who was admitted 04/07/2022 with a diagnosis of Hip fracture ([place]) and elected for surgical management.  The risks benefits and alternatives were discussed with the patient including but not limited to the risks of nonoperative treatment, versus surgical intervention including infection, bleeding, nerve injury, periprosthetic fracture, the need for revision surgery, dislocation, leg length discrepancy, blood clots, cardiopulmonary complications, morbidity, mortality, among others, and they were willing to proceed.  Predicted outcome is good, although there will be at least a six to nine month expected recovery.   OPERATIVE REPORT     SURGEON:  Teresa Constable, MD    ASSISTANT: Teresa Francis, RNFA (Present throughout the entire procedure,  necessary for completion of procedure in a timely manner, assisting with retraction, instrumentation, and closure)     ANESTHESIA: General  ESTIMATED BLOOD LOSS: 470 cc    COMPLICATIONS:  None.      COMPONENTS:  St[employer]ker size 5 Accolade C cemented hip stem with 127 degree neck angle, 52 bipolar head with 28 mm inner diameter, 28-4 ceramic femoral head Implant Name Type Inv. Item Serial No. Manufacturer Lot No. LRB No. Used Action  HEAD BIPOLAR LOCK UHR [id number] - [id number] Head HEAD BIPOLAR LOCK UHR 28X52  [employer] [id number] Right 1 Implanted  STEM HIP ACCOLADE SZ5 [id number] - [id number] Stem STEM HIP ACCOLADE SZ5 [id number]  [employer] [id number] Right 1 Implanted  HEAD FEM CER V40 28 -4 - [id number] Head HEAD FEM CER V40 28 -4  [employer] [id number] Right 1 Implanted      PROCEDURE IN DETAIL: The patient was met  in the holding area and identified.  The appropriate hip  was marked at the operative site. The patient was then transported to the OR and  placed under anesthesia.  At that point, the patient was  placed in the lateral decubitus position with the operative side up and  secured to the operating room table and all bony prominences padded. A subaxillary role was placed.    The operative lower extremity was prepped from the iliac crest to the ankle.  Sterile draping was performed.  2g of ancef and 1g TXA were given prior to incision. Time out was performed prior to incision.      A routine posterolateral approach was utilized via sharp dissection  carried down to the subcutaneous tissue.  Gross bleeders were Bovie  coagulated.  The iliotibial band was identified and incised  along the length of the skin incision.  A Charnley retractor was inserted with care to protect the sciatic nerve.  With the hip internally rotated, the short external rotators  were identified. The piriformis was tagged with #2 Ethibond, and the hip capsule released in a T-type fashion, and posterior sleeve of the capsule was also tagged.  The femoral neck was exposed, and I resected the femoral neck using the appropriate jig. This was performed at approximately a thumb's breadth above the lesser trochanter.    I then exposed the deep acetabulum, cleared out any tissue including the ligamentum teres.    I then prepared the proximal femur using the box cutter, Charnley awl, and then sequentially broached.  A trial utilized, and I reduced the hip, leg lengths  were assessed clinically and felt to be equal. The hip was then taken through a full range of motion, the hip was stable at full extension and 90 degrees external rotation without anterior subluxation. The hip was also stable in the position of sleep, and in neutral abduction up to 90 degrees flexion, and 90 degrees IR. The trial components were then removed.   We then prepared  canal for cementation.  The cement restrictor was measured and inserted distally.  The canal was then irrigated with the pulse lavage and 3 L of normal saline.  2 bags of Simplex cement were prepared.  Using the cement gun the cement was inserted distally and the canal was filled.  We then pressurized the canal. The real implant was then inserted matching the patient's native anteversion of approximately 90 degrees.  We then waited for 14 minutes for the cement to be fully set.  Excess cement was removed.  A lap was placed in the acetabulum prior to cementing was also removed and the acetabulum was assessed to make sure there was no cement or bone fragments.  The hip was then reduced with the trial head again and taken through functional range of motion and found to have excellent stability. Leg lengths were restored. The real head was then impacted onto the stem and the hip was again reduced.  The capsule was then repaired with #2 Ethibond., and the piriformis was repaired to the abductor tendon. Excellent posterior capsular repair was achieved.   I then irrigated the hip copiously again with pulse lavage. The wounds were injected with 20cc exparal diluted in sterile saline. The fascia and IT band was repaired with #1 stratafix, followed by 0 stratafix for the fat layer followed by 2-0 Vicryl and running 3-0 Monocryl for the skin, Dermabond was applied and an Aquacel dressing was placed.  The patient was then awakened and returned to PACU in stable and satisfactory condition. There were no complications.  Post op recs: WB: WBAT with no formal hip precautions Abx: ancef x23 hours post op Imaging: PACU xrays Dressing: Aquacel dressing to be kept intact until follow-up DVT prophylaxis: Aspirin 81 mg twice daily starting postop day 1 x 4 weeks Follow up: 2 weeks after surgery for a wound check with Dr. Zachery Francis at [facility].  Address: [address] [place], [place], [place] [address]   Office Phone: [phone]   Teresa Constable, MD Orthopedic Surgeon  04/08/2022 4:14 PM

## 2022-04-08 NOTE — TOC CAGE-AID Note (Signed)
Transition of Care [facility]) - CAGE-AID Screening   Patient Details  Name: Teresa Francis MRN: [id number] Date of Birth: 11-11-1940  Transition of Care [facility]) CM/SW Contact:    Bethann Berkshire, [place] Phone Number: 04/08/2022, 9:58 AM   CAGE-AID Screening: Substance Abuse Screening unable to be completed due to: : Patient unable to participate

## 2022-04-08 NOTE — Progress Notes (Signed)
PROGRESS NOTE    Teresa Francis  GUR:[id number] DOB: Feb 03, 1941 DOA: 04/07/2022 PCP: Jearld Fenton, NP   Brief Narrative:   82 y.o. female with medical history significant of dementia, HTN, and HLD presented with a fall.  She was found to have a right femoral neck fracture.  Orthopedics was consulted.  Assessment & Plan:   Right femoral neck fracture after a fall -Orthopedics planning for surgical intervention today.  Continue NPO.  Pain management.  Fall precautions.  Dementia -monitor mental status.  Delirium precautions.  Continue outpatient regimen of Namenda, Depakote, sertraline and melatonin  Hypokalemia -replace.  Repeat a.m. labs  Hypertension--continue amlodipine.  Hold hydrochlorothiazide because of hypokalemia.  Hyperlipidemia -Does not appear to be taking medications for this issue at this time.   DVT prophylaxis: SCDs Code Status: DNR Family Communication: None at bedside Disposition Plan: Status is: Inpatient Remains inpatient appropriate because: Of severity of illness    Consultants: Orthopedics  Procedures: None  Antimicrobials: None   Subjective: Patient seen and examined at bedside.  Confused.  No fever, vomiting or seizures reported.  Objective: Vitals:   04/07/22 1512 04/07/22 1939 04/08/22 0448 04/08/22 0738  BP: (!) 161/71 (!) 144/72 (!) 156/69 (!) 149/74  Pulse: 70 73 71 72  Resp: '18 18 16 16  '$ Temp:  99.6 F (37.6 C) 99.1 F (37.3 C) 98.6 F (37 C)  TempSrc:  Oral Oral Oral  SpO2: 93% 96% 94% 92%    Intake/Output Summary (Last 24 hours) at 04/08/2022 0955 Last data filed at 04/08/2022 0459 Gross per 24 hour  Intake 55 ml  Output 400 ml  Net -345 ml   There were no vitals filed for this visit.  Examination:  General exam: Appears calm and comfortable.  Looks chronically ill and deconditioned.  On room air.  Awake but confused. Respiratory system: Bilateral decreased breath sounds at bases Cardiovascular system: S1 & S2  heard, Rate controlled Gastrointestinal system: Abdomen is nondistended, soft and nontender. Normal bowel sounds heard. Extremities: No cyanosis, clubbing, edema    Data Reviewed: I have personally reviewed following labs and imaging studies  CBC: Recent Labs  Lab 04/07/22 0822 04/08/22 0721  WBC 9.2 9.0  NEUTROABS 6.6  --   HGB 15.5* 14.6  HCT 46.0 44.2  MCV 89.8 90.6  PLT 287 283   Basic Metabolic Panel: Recent Labs  Lab 04/07/22 0822 04/08/22 0721  NA 137 137  K 3.6 3.1*  CL 95* 98  CO2 32 27  GLUCOSE 96 97  BUN 17 14  CREATININE 0.63 0.84  CALCIUM 9.4 8.7*   GFR: CrCl cannot be calculated (Unknown ideal weight.). Liver Function Tests: No results for input(s): "AST", "ALT", "ALKPHOS", "BILITOT", "PROT", "ALBUMIN" in the last 168 hours. No results for input(s): "LIPASE", "AMYLASE" in the last 168 hours. No results for input(s): "AMMONIA" in the last 168 hours. Coagulation Profile: No results for input(s): "INR", "PROTIME" in the last 168 hours. Cardiac Enzymes: Recent Labs  Lab 04/07/22 0822  CKTOTAL 97   BNP (last 3 results) No results for input(s): "PROBNP" in the last 8760 hours. HbA1C: No results for input(s): "HGBA1C" in the last 72 hours. CBG: Recent Labs  Lab 04/07/22 0802  GLUCAP 83   Lipid Profile: No results for input(s): "CHOL", "HDL", "LDLCALC", "TRIG", "CHOLHDL", "LDLDIRECT" in the last 72 hours. Thyroid Function Tests: No results for input(s): "TSH", "T4TOTAL", "FREET4", "T3FREE", "THYROIDAB" in the last 72 hours. Anemia Panel: No results for input(s): "VITAMINB12", "FOLATE", "FERRITIN", "  TIBC", "IRON", "RETICCTPCT" in the last 72 hours. Sepsis Labs: No results for input(s): "PROCALCITON", "LATICACIDVEN" in the last 168 hours.  Recent Results (from the past 240 hour(s))  MRSA Next Gen by PCR, Nasal     Status: None   Collection Time: 04/07/22 11:30 PM   Specimen: Nasal Mucosa; Nasal Swab  Result Value Ref Range Status   MRSA by  PCR Next Gen NOT DETECTED NOT DETECTED Final    Comment: (NOTE) The GeneXpert MRSA Assay (FDA approved for NASAL specimens only), is one component of a comprehensive MRSA colonization surveillance program. It is not intended to diagnose MRSA infection nor to guide or monitor treatment for MRSA infections. Test performance is not FDA approved in patients less than 60 years old. Performed at [facility], [place] [address] [place], [place] [address]          Radiology Studies: CT Head Wo Contrast  Result Date: 04/07/2022 CLINICAL DATA:  Head trauma. EXAM: CT HEAD WITHOUT CONTRAST TECHNIQUE: Contiguous axial images were obtained from the base of the skull through the vertex without intravenous contrast. RADIATION DOSE REDUCTION: This exam was performed according to the departmental dose-optimization program which includes automated exposure control, adjustment of the mA and/or kV according to patient size and/or use of iterative reconstruction technique. COMPARISON:  07/11/2016. FINDINGS: Brain: There is periventricular white matter decreased attenuation consistent with small vessel ischemic changes. Ventricles, sulci and cisterns are prominent consistent with age related involutional changes. No acute intracranial hemorrhage, mass effect or shift. No hydrocephalus. Vascular: No hyperdense vessel or unexpected calcification. Skull: Normal. Negative for fracture or focal lesion. Sinuses/Orbits: No acute finding. IMPRESSION: Atrophy and chronic small vessel ischemic changes. No acute intracranial process identified. Electronically Signed   By: Sammie Bench M.D.   On: 04/07/2022 09:20   DG Femur Min 2 Views Right  Result Date: 04/07/2022 CLINICAL DATA:  Fall.  Deformity and foreshortening of right leg. EXAM: RIGHT FEMUR 2 VIEWS; PELVIS - 1-2 VIEW; RIGHT KNEE - 1-2 VIEW COMPARISON:  CT right knee 07/12/2016 FINDINGS: There is diffuse decreased bone mineralization. Pelvis and right femur:  There is an acute fracture of the right femoral neck with mild varus angulation. Likely mild superior displacement of the distal fracture component with respect to the proximal fracture component. Moderate superior right greater than left femoroacetabular joint space narrowing. Mild bilateral superolateral acetabular degenerative osteophytes. Mild bilateral sacroiliac subchondral sclerosis. There is mild irregularity of the superior aspect of the right pubic body which appears chronic, however recommend clinical correlation for point tenderness to exclude an acute fracture. Mild-to-moderate superior pubic symphysis joint space narrowing. Right knee: Moderate to severe medial and moderate lateral compartment joint space narrowing with mild peripheral degenerative osteophytes. No lateral view was achieved, and on limited oblique views there appears to be at least mild patellofemoral joint space narrowing and peripheral osteophytosis. There is a small sclerotic focus within the proximal fibula which is similar to prior CT and compatible with a benign enchondroma. No definite knee joint effusion. IMPRESSION: 1. Acute fracture of the right femoral neck with mild varus angulation. 2. There is mild irregularity of the superior aspect of the right pubic body which appears chronic, however recommend clinical correlation for point tenderness to exclude an acute fracture. Electronically Signed   By: Yvonne Kendall M.D.   On: 04/07/2022 09:06   DG Pelvis 1-2 Views  Result Date: 04/07/2022 CLINICAL DATA:  Fall.  Deformity and foreshortening of right leg. EXAM: RIGHT FEMUR  2 VIEWS; PELVIS - 1-2 VIEW; RIGHT KNEE - 1-2 VIEW COMPARISON:  CT right knee 07/12/2016 FINDINGS: There is diffuse decreased bone mineralization. Pelvis and right femur: There is an acute fracture of the right femoral neck with mild varus angulation. Likely mild superior displacement of the distal fracture component with respect to the proximal fracture  component. Moderate superior right greater than left femoroacetabular joint space narrowing. Mild bilateral superolateral acetabular degenerative osteophytes. Mild bilateral sacroiliac subchondral sclerosis. There is mild irregularity of the superior aspect of the right pubic body which appears chronic, however recommend clinical correlation for point tenderness to exclude an acute fracture. Mild-to-moderate superior pubic symphysis joint space narrowing. Right knee: Moderate to severe medial and moderate lateral compartment joint space narrowing with mild peripheral degenerative osteophytes. No lateral view was achieved, and on limited oblique views there appears to be at least mild patellofemoral joint space narrowing and peripheral osteophytosis. There is a small sclerotic focus within the proximal fibula which is similar to prior CT and compatible with a benign enchondroma. No definite knee joint effusion. IMPRESSION: 1. Acute fracture of the right femoral neck with mild varus angulation. 2. There is mild irregularity of the superior aspect of the right pubic body which appears chronic, however recommend clinical correlation for point tenderness to exclude an acute fracture. Electronically Signed   By: Yvonne Kendall M.D.   On: 04/07/2022 09:06   DG Knee 1-2 Views Right  Result Date: 04/07/2022 CLINICAL DATA:  Fall.  Deformity and foreshortening of right leg. EXAM: RIGHT FEMUR 2 VIEWS; PELVIS - 1-2 VIEW; RIGHT KNEE - 1-2 VIEW COMPARISON:  CT right knee 07/12/2016 FINDINGS: There is diffuse decreased bone mineralization. Pelvis and right femur: There is an acute fracture of the right femoral neck with mild varus angulation. Likely mild superior displacement of the distal fracture component with respect to the proximal fracture component. Moderate superior right greater than left femoroacetabular joint space narrowing. Mild bilateral superolateral acetabular degenerative osteophytes. Mild bilateral sacroiliac  subchondral sclerosis. There is mild irregularity of the superior aspect of the right pubic body which appears chronic, however recommend clinical correlation for point tenderness to exclude an acute fracture. Mild-to-moderate superior pubic symphysis joint space narrowing. Right knee: Moderate to severe medial and moderate lateral compartment joint space narrowing with mild peripheral degenerative osteophytes. No lateral view was achieved, and on limited oblique views there appears to be at least mild patellofemoral joint space narrowing and peripheral osteophytosis. There is a small sclerotic focus within the proximal fibula which is similar to prior CT and compatible with a benign enchondroma. No definite knee joint effusion. IMPRESSION: 1. Acute fracture of the right femoral neck with mild varus angulation. 2. There is mild irregularity of the superior aspect of the right pubic body which appears chronic, however recommend clinical correlation for point tenderness to exclude an acute fracture. Electronically Signed   By: Yvonne Kendall M.D.   On: 04/07/2022 09:06   DG Chest 1 View  Result Date: 04/07/2022 CLINICAL DATA:  Fall.  Deformity and foreshortening of right leg. EXAM: CHEST  1 VIEW COMPARISON:  AP chest 07/12/2016 FINDINGS: Cardiac silhouette and mediastinal contours are unchanged and within normal limits with mild calcification within the aortic arch. Mild bilateral interstitial thickening is similar to prior and chronic. No focal airspace opacity. No pleural effusion or pneumothorax. No definite acute displaced rib fracture is seen. IMPRESSION: 1. No acute cardiopulmonary process. 2. Chronic interstitial thickening. 3. No definite acute displaced rib fracture. Electronically  Signed   By: Yvonne Kendall M.D.   On: 04/07/2022 09:02        Scheduled Meds:  amLODipine  10 mg Oral Daily   aspirin EC  81 mg Oral Daily   divalproex  375 mg Oral QPM   divalproex  500 mg Oral Daily   docusate  sodium  100 mg Oral BID   fluticasone  1 spray Each Nare QHS   hydrochlorothiazide  12.5 mg Oral Daily   melatonin  5 mg Oral QHS,MR X 1   memantine  28 mg Oral Daily   pantoprazole  20 mg Oral BID   sertraline  75 mg Oral Daily   Continuous Infusions:   ceFAZolin (ANCEF) IV     methocarbamol (ROBAXIN) IV 500 mg (04/08/22 0006)   tranexamic acid            Aline August, MD [facility] 04/08/2022, 9:55 AM '

## 2022-04-08 NOTE — Discharge Instructions (Signed)
INSTRUCTIONS AFTER JOINT REPLACEMENT   Remove items at home which could result in a fall. This includes throw rugs or furniture in walking pathways ICE to the affected joint every three hours while awake for 30 minutes at a time, for at least the first 3-5 days, and then as needed for pain and swelling.  Continue to use ice for pain and swelling. You may notice swelling that will progress down to the foot and ankle.  This is normal after surgery.  Elevate your leg when you are not up walking on it.   Continue to use the breathing machine you got in the hospital (incentive spirometer) which will help keep your temperature down.  It is common for your temperature to cycle up and down following surgery, especially at night when you are not up moving around and exerting yourself.  The breathing machine keeps your lungs expanded and your temperature down.   DIET:  As you were doing prior to hospitalization, we recommend a well-balanced diet.  DRESSING / WOUND CARE / SHOWERING  Keep the surgical dressing until follow up.  The dressing is water proof, so you can shower without any extra covering.  IF THE DRESSING FALLS OFF or the wound gets wet inside, change the dressing with sterile gauze.  Please use good hand washing techniques before changing the dressing.  Do not use any lotions or creams on the incision until instructed by your surgeon.    ACTIVITY  Increase activity slowly as tolerated, but follow the weight bearing instructions below.   No driving for 6 weeks or until further direction given by your physician.  You cannot drive while taking narcotics.  No lifting or carrying greater than 10 lbs. until further directed by your surgeon. Avoid periods of inactivity such as sitting longer than an hour when not asleep. This helps prevent blood clots.  You may return to work once you are authorized by your doctor.     WEIGHT BEARING   Weight bearing as tolerated with assist device (walker, cane,  etc) as directed, use it as long as suggested by your surgeon or therapist, typically at least 4-6 weeks.   EXERCISES  Results after joint replacement surgery are often greatly improved when you follow the exercise, range of motion and muscle strengthening exercises prescribed by your doctor. Safety measures are also important to protect the joint from further injury. Any time any of these exercises cause you to have increased pain or swelling, decrease what you are doing until you are comfortable again and then slowly increase them. If you have problems or questions, call your caregiver or physical therapist for advice.   Rehabilitation is important following a joint replacement. After just a few days of immobilization, the muscles of the leg can become weakened and shrink (atrophy).  These exercises are designed to build up the tone and strength of the thigh and leg muscles and to improve motion. Often times heat used for twenty to thirty minutes before working out will loosen up your tissues and help with improving the range of motion but do not use heat for the first two weeks following surgery (sometimes heat can increase post-operative swelling).   These exercises can be done on a training (exercise) mat, on the floor, on a table or on a bed. Use whatever works the best and is most comfortable for you.    Use music or television while you are exercising so that the exercises are a pleasant break in your  day. This will make your life better with the exercises acting as a break in your routine that you can look forward to.   Perform all exercises about fifteen times, three times per day or as directed.  You should exercise both the operative leg and the other leg as well.  Exercises include:   Quad Sets - Tighten up the muscle on the front of the thigh (Quad) and hold for 5-10 seconds.   Straight Leg Raises - With your knee straight (if you were given a brace, keep it on), lift the leg to 60  degrees, hold for 3 seconds, and slowly lower the leg.  Perform this exercise against resistance later as your leg gets stronger.  Leg Slides: Lying on your back, slowly slide your foot toward your buttocks, bending your knee up off the floor (only go as far as is comfortable). Then slowly slide your foot back down until your leg is flat on the floor again.  Angel Wings: Lying on your back spread your legs to the side as far apart as you can without causing discomfort.  Hamstring Strength:  Lying on your back, push your heel against the floor with your leg straight by tightening up the muscles of your buttocks.  Repeat, but this time bend your knee to a comfortable angle, and push your heel against the floor.  You may put a pillow under the heel to make it more comfortable if necessary.   A rehabilitation program following joint replacement surgery can speed recovery and prevent re-injury in the future due to weakened muscles. Contact your doctor or a physical therapist for more information on knee rehabilitation.    CONSTIPATION  Constipation is defined medically as fewer than three stools per week and severe constipation as less than one stool per week.  Even if you have a regular bowel pattern at home, your normal regimen is likely to be disrupted due to multiple reasons following surgery.  Combination of anesthesia, postoperative narcotics, change in appetite and fluid intake all can affect your bowels.   YOU MUST use at least one of the following options; they are listed in order of increasing strength to get the job done.  They are all available over the counter, and you may need to use some, POSSIBLY even all of these options:    Drink plenty of fluids (prune juice may be helpful) and high fiber foods Colace 100 mg by mouth twice a day  Senokot for constipation as directed and as needed Dulcolax (bisacodyl), take with full glass of water  Miralax (polyethylene glycol) once or twice a day as  needed.  If you have tried all these things and are unable to have a bowel movement in the first 3-4 days after surgery call either your surgeon or your primary doctor.    If you experience loose stools or diarrhea, hold the medications until you stool forms back up.  If your symptoms do not get better within 1 week or if they get worse, check with your doctor.  If you experience "the worst abdominal pain ever" or develop nausea or vomiting, please contact the office immediately for further recommendations for treatment.   ITCHING:  If you experience itching with your medications, try taking only a single pain pill, or even half a pain pill at a time.  You can also use Benadryl over the counter for itching or also to help with sleep.   TED HOSE STOCKINGS:  Use stockings on both  legs until for at least 2 weeks or as directed by physician office. They may be removed at night for sleeping.  MEDICATIONS:  See your medication summary on the "After Visit Summary" that nursing will review with you.  You may have some home medications which will be placed on hold until you complete the course of blood thinner medication.  It is important for you to complete the blood thinner medication as prescribed.   Blood clot prevention (DVT Prophylaxis): After surgery you are at an increased risk for a blood clot. you were prescribed a blood thinner, Aspirin 81mg, to be taken twice daily for a total of 4 weeks from surgery to help reduce your risk of getting a blood clot. This will help prevent a blood clot. Signs of a pulmonary embolus (blood clot in the lungs) include sudden short of breath, feeling lightheaded or dizzy, chest pain with a deep breath, rapid pulse rapid breathing. Signs of a blood clot in your arms or legs include new unexplained swelling and cramping, warm, red or darkened skin around the painful area. Please call the office or 911 right away if these signs or symptoms develop.  PRECAUTIONS:  If you  experience chest pain or shortness of breath - call 911 immediately for transfer to the hospital emergency department.   If you develop a fever greater that 101 F, purulent drainage from wound, increased redness or drainage from wound, foul odor from the wound/dressing, or calf pain - CONTACT YOUR SURGEON.                                                   FOLLOW-UP APPOINTMENTS:  If you do not already have a post-op appointment, please call the office for an appointment to be seen by your surgeon.  Guidelines for how soon to be seen are listed in your "After Visit Summary", but are typically between 2-3 weeks after surgery.  OTHER INSTRUCTIONS:    POST-OPERATIVE OPIOID TAPER INSTRUCTIONS: It is important to wean off of your opioid medication as soon as possible. If you do not need pain medication after your surgery it is ok to stop day one. Opioids include: Codeine, Hydrocodone(Norco, Vicodin), Oxycodone(Percocet, oxycontin) and hydromorphone amongst others.  Long term and even short term use of opiods can cause: Increased pain response Dependence Constipation Depression Respiratory depression And more.  Withdrawal symptoms can include Flu like symptoms Nausea, vomiting And more Techniques to manage these symptoms Hydrate well Eat regular healthy meals Stay active Use relaxation techniques(deep breathing, meditating, yoga) Do Not substitute Alcohol to help with tapering If you have been on opioids for less than two weeks and do not have pain than it is ok to stop all together.  Plan to wean off of opioids This plan should start within one week post op of your joint replacement. Maintain the same interval or time between taking each dose and first decrease the dose.  Cut the total daily intake of opioids by one tablet each day Next start to increase the time between doses. The last dose that should be eliminated is the evening dose.   MAKE SURE YOU:  Understand these  instructions.  Get help right away if you are not doing well or get worse.    Thank you for letting us be a part of your medical care team.    is a privilege we respect greatly.  We hope these instructions will help you stay on track for a fast and full recovery!

## 2022-04-08 NOTE — Anesthesia Procedure Notes (Signed)
Procedure Name: Intubation Date/Time: 04/08/2022 2:51 PM  Performed by: Anastasio Auerbach, CRNAPre-anesthesia Checklist: Patient identified, Emergency Drugs available, Suction available and Patient being monitored Patient Re-evaluated:Patient Re-evaluated prior to induction Oxygen Delivery Method: Circle system utilized Preoxygenation: Pre-oxygenation with 100% oxygen Induction Type: IV induction Ventilation: Mask ventilation without difficulty Laryngoscope Size: Miller and 2 Grade View: Grade I Tube type: Oral Tube size: 6.5 mm Number of attempts: 1 Airway Equipment and Method: Stylet and Oral airway Placement Confirmation: ETT inserted through vocal cords under direct vision, positive ETCO2 and breath sounds checked- equal and bilateral Secured at: 23 cm Tube secured with: Tape Dental Injury: Teeth and Oropharynx as per pre-operative assessment  Comments: Intubation by SRNA under direct supervision by CRNA

## 2022-04-08 NOTE — Progress Notes (Signed)
Patient was taken to OR at 1320 via bed. Vital signs are stable.

## 2022-04-09 DIAGNOSIS — I10 Essential (primary) hypertension: Secondary | ICD-10-CM | POA: Diagnosis not present

## 2022-04-09 DIAGNOSIS — S72001A Fracture of unspecified part of neck of right femur, initial encounter for closed fracture: Secondary | ICD-10-CM | POA: Diagnosis not present

## 2022-04-09 LAB — CBC
HCT: 39 % (ref 36.0–46.0)
Hemoglobin: 13.5 g/dL (ref 12.0–15.0)
MCH: 31 pg (ref 26.0–34.0)
MCHC: 34.6 g/dL (ref 30.0–36.0)
MCV: 89.4 fL (ref 80.0–100.0)
Platelets: 312 10*3/uL (ref 150–400)
RBC: 4.36 MIL/uL (ref 3.87–5.11)
RDW: 13.9 % (ref 11.5–15.5)
WBC: 13.3 10*3/uL — ABNORMAL HIGH (ref 4.0–10.5)
nRBC: 0 % (ref 0.0–0.2)

## 2022-04-09 LAB — BASIC METABOLIC PANEL
Anion gap: 9 (ref 5–15)
BUN: 17 mg/dL (ref 8–23)
CO2: 29 mmol/L (ref 22–32)
Calcium: 8.8 mg/dL — ABNORMAL LOW (ref 8.9–10.3)
Chloride: 98 mmol/L (ref 98–111)
Creatinine, Ser: 0.69 mg/dL (ref 0.44–1.00)
GFR, Estimated: >60 mL/min (ref >60)
Glucose, Bld: 111 mg/dL — ABNORMAL HIGH (ref 70–99)
Potassium: 3.6 mmol/L (ref 3.5–5.1)
Sodium: 136 mmol/L (ref 135–145)

## 2022-04-09 MED ORDER — ADULT MULTIVITAMIN W/MINERALS CH
1.0000 | ORAL_TABLET | Freq: Every day | ORAL | Status: DC
  Administered 2022-04-09 – 2022-04-10 (×2): 1 via ORAL
  Filled 2022-04-09 (×2): qty 1

## 2022-04-09 MED ORDER — ENSURE ENLIVE PO LIQD
237.0000 mL | Freq: Two times a day (BID) | ORAL | Status: DC
  Administered 2022-04-10: 237 mL via ORAL

## 2022-04-09 NOTE — Progress Notes (Signed)
Patient able to walk with assistance with walker, but has delay when attempting to move feet. Pain level at a 6, pain med provided. No skin issues on buttocks , skin intact. Plan of care continues

## 2022-04-09 NOTE — Progress Notes (Signed)
PROGRESS NOTE    HEER JUSTISS  WFU:[id number] DOB: [birth year] DOA: 04/07/2022 PCP: Jearld Fenton, NP   Brief Narrative:   82 y.o. female with medical history significant of dementia, HTN, and HLD presented with a fall.  She was found to have a right femoral neck fracture.  Orthopedics was consulted.  Assessment & Plan:   Right femoral neck fracture after a fall -Underwent right hip bipolar hemiarthroplasty on 04/08/2022.  Wound care and DVT prophylaxis as per orthopedics recommendations.  Continue pain management.  Fall precautions. -PT/OT eval.  Dementia -monitor mental status.  Delirium precautions.  Continue outpatient regimen of Namenda, Depakote, sertraline and melatonin  Leukocytosis -Mild.  Hypokalemia -Labs pending today.  Hypertension--continue amlodipine. Hydrochlorothiazide on hold because of hypokalemia.    Hyperlipidemia -Does not appear to be taking medications for this issue at this time.   DVT prophylaxis: SCDs Code Status: DNR Family Communication: None at bedside Disposition Plan: Status is: Inpatient Remains inpatient appropriate because: Of severity of illness.  Need for PT eval.    Consultants: Orthopedics  Procedures: As above  Antimicrobials: Perioperative   Subjective: Patient seen and examined at bedside.  Confused.  No agitation, fever, vomiting reported. Objective: Vitals:   04/08/22 1850 04/08/22 2006 04/08/22 2334 04/09/22 0329  BP: 120/70 105/61 (!) 140/66 135/78  Pulse: 76 72 66 68  Resp: 16     Temp: 98.4 F (36.9 C) 97.7 F (36.5 C) 97.7 F (36.5 C) (!) 97.5 F (36.4 C)  TempSrc:  Oral Oral Oral  SpO2: 100% 94% 100% 97%  Weight:      Height:        Intake/Output Summary (Last 24 hours) at 04/09/2022 0728 Last data filed at 04/09/2022 2025 Gross per 24 hour  Intake 622.2 ml  Output 1085 ml  Net -462.8 ml    Filed Weights   04/08/22 1212  Weight: 70 kg    Examination:  General exam: No distress.  On room  air.  Looks chronically ill and deconditioned.  Slow to respond.  Poor historian.  Awake but confused. Respiratory system: Decreased breath sounds at bases bilaterally, no wheezing  cardiovascular system: S1 & S2 heard, Rate controlled currently Gastrointestinal system: Abdomen is distended slightly; soft and nontender. bowel sounds heard normally. Extremities: No edema or clubbing   Data Reviewed: I have personally reviewed following labs and imaging studies  CBC: Recent Labs  Lab 04/07/22 0822 04/08/22 0721 04/09/22 0646  WBC 9.2 9.0 13.3*  NEUTROABS 6.6  --   --   HGB 15.5* 14.6 13.5  HCT 46.0 44.2 39.0  MCV 89.8 90.6 89.4  PLT 287 292 427    Basic Metabolic Panel: Recent Labs  Lab 04/07/22 0822 04/08/22 0721  NA 137 137  K 3.6 3.1*  CL 95* 98  CO2 32 27  GLUCOSE 96 97  BUN 17 14  CREATININE 0.63 0.84  CALCIUM 9.4 8.7*    GFR: Estimated Creatinine Clearance: 53 mL/min (by C-G formula based on SCr of 0.84 mg/dL). Liver Function Tests: No results for input(s): "AST", "ALT", "ALKPHOS", "BILITOT", "PROT", "ALBUMIN" in the last 168 hours. No results for input(s): "LIPASE", "AMYLASE" in the last 168 hours. No results for input(s): "AMMONIA" in the last 168 hours. Coagulation Profile: No results for input(s): "INR", "PROTIME" in the last 168 hours. Cardiac Enzymes: Recent Labs  Lab 04/07/22 0822  CKTOTAL 97    BNP (last 3 results) No results for input(s): "PROBNP" in the last 8760 hours. HbA1C:  No results for input(s): "HGBA1C" in the last 72 hours. CBG: Recent Labs  Lab 04/07/22 0802  GLUCAP 83    Lipid Profile: No results for input(s): "CHOL", "HDL", "LDLCALC", "TRIG", "CHOLHDL", "LDLDIRECT" in the last 72 hours. Thyroid Function Tests: No results for input(s): "TSH", "T4TOTAL", "FREET4", "T3FREE", "THYROIDAB" in the last 72 hours. Anemia Panel: No results for input(s): "VITAMINB12", "FOLATE", "FERRITIN", "TIBC", "IRON", "RETICCTPCT" in the last 72  hours. Sepsis Labs: No results for input(s): "PROCALCITON", "LATICACIDVEN" in the last 168 hours.  Recent Results (from the past 240 hour(s))  MRSA Next Gen by PCR, Nasal     Status: None   Collection Time: 04/07/22 11:30 PM   Specimen: Nasal Mucosa; Nasal Swab  Result Value Ref Range Status   MRSA by PCR Next Gen NOT DETECTED NOT DETECTED Final    Comment: (NOTE) The GeneXpert MRSA Assay (FDA approved for NASAL specimens only), is one component of a comprehensive MRSA colonization surveillance program. It is not intended to diagnose MRSA infection nor to guide or monitor treatment for MRSA infections. Test performance is not FDA approved in patients less than 35 years old. Performed at [facility], [place] [address] [place], [place] [address]          Radiology Studies: DG HIP UNILAT W OR W/O PELVIS 2-3 VIEWS RIGHT  Result Date: 04/08/2022 CLINICAL DATA:  Postoperative state. EXAM: DG HIP (WITH OR WITHOUT PELVIS) 2-3V RIGHT COMPARISON:  Preoperative imaging. FINDINGS: Right hip arthroplasty in expected alignment. No periprosthetic lucency or fracture. Recent postsurgical change includes air and edema in the soft tissues. IMPRESSION: Right hip arthroplasty without immediate postoperative complication. Electronically Signed   By: Keith Rake M.D.   On: 04/08/2022 18:07   CT Head Wo Contrast  Result Date: 04/07/2022 CLINICAL DATA:  Head trauma. EXAM: CT HEAD WITHOUT CONTRAST TECHNIQUE: Contiguous axial images were obtained from the base of the skull through the vertex without intravenous contrast. RADIATION DOSE REDUCTION: This exam was performed according to the departmental dose-optimization program which includes automated exposure control, adjustment of the mA and/or kV according to patient size and/or use of iterative reconstruction technique. COMPARISON:  07/11/2016. FINDINGS: Brain: There is periventricular white matter decreased attenuation consistent with small vessel  ischemic changes. Ventricles, sulci and cisterns are prominent consistent with age related involutional changes. No acute intracranial hemorrhage, mass effect or shift. No hydrocephalus. Vascular: No hyperdense vessel or unexpected calcification. Skull: Normal. Negative for fracture or focal lesion. Sinuses/Orbits: No acute finding. IMPRESSION: Atrophy and chronic small vessel ischemic changes. No acute intracranial process identified. Electronically Signed   By: Sammie Bench M.D.   On: 04/07/2022 09:20   DG Femur Min 2 Views Right  Result Date: 04/07/2022 CLINICAL DATA:  Fall.  Deformity and foreshortening of right leg. EXAM: RIGHT FEMUR 2 VIEWS; PELVIS - 1-2 VIEW; RIGHT KNEE - 1-2 VIEW COMPARISON:  CT right knee 07/12/2016 FINDINGS: There is diffuse decreased bone mineralization. Pelvis and right femur: There is an acute fracture of the right femoral neck with mild varus angulation. Likely mild superior displacement of the distal fracture component with respect to the proximal fracture component. Moderate superior right greater than left femoroacetabular joint space narrowing. Mild bilateral superolateral acetabular degenerative osteophytes. Mild bilateral sacroiliac subchondral sclerosis. There is mild irregularity of the superior aspect of the right pubic body which appears chronic, however recommend clinical correlation for point tenderness to exclude an acute fracture. Mild-to-moderate superior pubic symphysis joint space narrowing. Right knee: Moderate to  severe medial and moderate lateral compartment joint space narrowing with mild peripheral degenerative osteophytes. No lateral view was achieved, and on limited oblique views there appears to be at least mild patellofemoral joint space narrowing and peripheral osteophytosis. There is a small sclerotic focus within the proximal fibula which is similar to prior CT and compatible with a benign enchondroma. No definite knee joint effusion. IMPRESSION: 1.  Acute fracture of the right femoral neck with mild varus angulation. 2. There is mild irregularity of the superior aspect of the right pubic body which appears chronic, however recommend clinical correlation for point tenderness to exclude an acute fracture. Electronically Signed   By: Yvonne Kendall M.D.   On: 04/07/2022 09:06   DG Pelvis 1-2 Views  Result Date: 04/07/2022 CLINICAL DATA:  Fall.  Deformity and foreshortening of right leg. EXAM: RIGHT FEMUR 2 VIEWS; PELVIS - 1-2 VIEW; RIGHT KNEE - 1-2 VIEW COMPARISON:  CT right knee 07/12/2016 FINDINGS: There is diffuse decreased bone mineralization. Pelvis and right femur: There is an acute fracture of the right femoral neck with mild varus angulation. Likely mild superior displacement of the distal fracture component with respect to the proximal fracture component. Moderate superior right greater than left femoroacetabular joint space narrowing. Mild bilateral superolateral acetabular degenerative osteophytes. Mild bilateral sacroiliac subchondral sclerosis. There is mild irregularity of the superior aspect of the right pubic body which appears chronic, however recommend clinical correlation for point tenderness to exclude an acute fracture. Mild-to-moderate superior pubic symphysis joint space narrowing. Right knee: Moderate to severe medial and moderate lateral compartment joint space narrowing with mild peripheral degenerative osteophytes. No lateral view was achieved, and on limited oblique views there appears to be at least mild patellofemoral joint space narrowing and peripheral osteophytosis. There is a small sclerotic focus within the proximal fibula which is similar to prior CT and compatible with a benign enchondroma. No definite knee joint effusion. IMPRESSION: 1. Acute fracture of the right femoral neck with mild varus angulation. 2. There is mild irregularity of the superior aspect of the right pubic body which appears chronic, however recommend  clinical correlation for point tenderness to exclude an acute fracture. Electronically Signed   By: Yvonne Kendall M.D.   On: 04/07/2022 09:06   DG Knee 1-2 Views Right  Result Date: 04/07/2022 CLINICAL DATA:  Fall.  Deformity and foreshortening of right leg. EXAM: RIGHT FEMUR 2 VIEWS; PELVIS - 1-2 VIEW; RIGHT KNEE - 1-2 VIEW COMPARISON:  CT right knee 07/12/2016 FINDINGS: There is diffuse decreased bone mineralization. Pelvis and right femur: There is an acute fracture of the right femoral neck with mild varus angulation. Likely mild superior displacement of the distal fracture component with respect to the proximal fracture component. Moderate superior right greater than left femoroacetabular joint space narrowing. Mild bilateral superolateral acetabular degenerative osteophytes. Mild bilateral sacroiliac subchondral sclerosis. There is mild irregularity of the superior aspect of the right pubic body which appears chronic, however recommend clinical correlation for point tenderness to exclude an acute fracture. Mild-to-moderate superior pubic symphysis joint space narrowing. Right knee: Moderate to severe medial and moderate lateral compartment joint space narrowing with mild peripheral degenerative osteophytes. No lateral view was achieved, and on limited oblique views there appears to be at least mild patellofemoral joint space narrowing and peripheral osteophytosis. There is a small sclerotic focus within the proximal fibula which is similar to prior CT and compatible with a benign enchondroma. No definite knee joint effusion. IMPRESSION: 1. Acute fracture of the  right femoral neck with mild varus angulation. 2. There is mild irregularity of the superior aspect of the right pubic body which appears chronic, however recommend clinical correlation for point tenderness to exclude an acute fracture. Electronically Signed   By: Yvonne Kendall M.D.   On: 04/07/2022 09:06   DG Chest 1 View  Result Date:  04/07/2022 CLINICAL DATA:  Fall.  Deformity and foreshortening of right leg. EXAM: CHEST  1 VIEW COMPARISON:  AP chest 07/12/2016 FINDINGS: Cardiac silhouette and mediastinal contours are unchanged and within normal limits with mild calcification within the aortic arch. Mild bilateral interstitial thickening is similar to prior and chronic. No focal airspace opacity. No pleural effusion or pneumothorax. No definite acute displaced rib fracture is seen. IMPRESSION: 1. No acute cardiopulmonary process. 2. Chronic interstitial thickening. 3. No definite acute displaced rib fracture. Electronically Signed   By: Yvonne Kendall M.D.   On: 04/07/2022 09:02        Scheduled Meds:  amLODipine  10 mg Oral Daily   aspirin EC  81 mg Oral BID   divalproex  375 mg Oral QPM   divalproex  500 mg Oral Daily   docusate sodium  100 mg Oral BID   fluticasone  1 spray Each Nare QHS   melatonin  5 mg Oral QHS,MR X 1   memantine  28 mg Oral Daily   pantoprazole  20 mg Oral BID   sertraline  75 mg Oral Daily   Continuous Infusions:  methocarbamol (ROBAXIN) IV 500 mg (04/08/22 0006)          Aline August, MD [facility] 04/09/2022, 7:28 AM '

## 2022-04-09 NOTE — Anesthesia Postprocedure Evaluation (Signed)
Anesthesia Post Note  Patient: Teresa Francis  Procedure(s) Performed: RIGHT HIP HEMIARTHOPLASTY (Right: Hip)     Patient location during evaluation: PACU Anesthesia Type: General Level of consciousness: awake and alert Pain management: pain level controlled Vital Signs Assessment: post-procedure vital signs reviewed and stable Respiratory status: spontaneous breathing, nonlabored ventilation, respiratory function stable and patient connected to nasal cannula oxygen Cardiovascular status: blood pressure returned to baseline and stable Postop Assessment: no apparent nausea or vomiting Anesthetic complications: no   No notable events documented.  Last Vitals:  Vitals:   04/08/22 2334 04/09/22 0329  BP: (!) 140/66 135/78  Pulse: 66 68  Resp:    Temp: 36.5 C (!) 36.4 C  SpO2: 100% 97%    Last Pain:  Vitals:   04/09/22 0329  TempSrc: Oral  PainSc:                  [name]

## 2022-04-09 NOTE — Evaluation (Signed)
Physical Therapy Evaluation Patient Details Name: Teresa Francis MRN: [id number] DOB: [birth year] Today's Date: 04/09/2022  History of Present Illness  82 y.o. female admitted 1/24 presented with a fall.  She was found to have a right femoral neck fracture.  Right hemiarthoplasty 1/24.  PMH: dementia, HTN, and HLD  Clinical Impression  Pt admitted with above diagnosis. Pt was able to laterally scoot to drop arm recliner with mod asssist for mobility overall. Will need 24 hour care therefore if doesn't have 24 hour care, will need SNF.  Pt could not answer questions about her prior level of function due to dementia. Pt currently with functional limitations due to the deficits listed below (see PT Problem List). Pt will benefit from skilled PT to increase their independence and safety with mobility to allow discharge to the venue listed below.          Recommendations for follow up therapy are one component of a multi-disciplinary discharge planning process, led by the attending physician.  Recommendations may be updated based on patient status, additional functional criteria and insurance authorization.  Follow Up Recommendations Skilled nursing-short term rehab (<3 hours/day) Can patient physically be transported by private vehicle: Yes    Assistance Recommended at Discharge Frequent or constant Supervision/Assistance  Patient can return home with the following  A lot of help with walking and/or transfers;A lot of help with bathing/dressing/bathroom;Assistance with cooking/housework;Assist for transportation;Help with stairs or ramp for entrance    Equipment Recommendations Other (comment) (TBA)  Recommendations for Other Services       Functional Status Assessment Patient has had a recent decline in their functional status and demonstrates the ability to make significant improvements in function in a reasonable and predictable amount of time.     Precautions / Restrictions  Precautions Precautions: Fall Restrictions Weight Bearing Restrictions: Yes RLE Weight Bearing: Weight bearing as tolerated      Mobility  Bed Mobility Overal bed mobility: Needs Assistance Bed Mobility: Supine to Sit     Supine to sit: Mod assist, HOB elevated     General bed mobility comments: Needed assist to initiate LE movement as well as for elevation of trunk. Also help to get balance once EOB and to scoot EOB.    Transfers Overall transfer level: Needs assistance Equipment used: Rolling walker (2 wheels), 2 person hand held assist Transfers: Sit to/from Stand, Bed to chair/wheelchair/BSC Sit to Stand: Mod assist, From elevated surface     Squat pivot transfers: Mod assist    Lateral/Scoot Transfers: Mod assist, From elevated surface General transfer comment: Pt attempted to stand to RW needing mod assist but could not stand enough to use RW for transfer.  Pt then attempted squat pivot and with several squats and turns with pt scooting in increments and with mod assist, pt scooted to drop arm recliner.    Ambulation/Gait                  Stairs            Wheelchair Mobility    Modified Rankin (Stroke Patients Only)       Balance Overall balance assessment: Needs assistance Sitting-balance support: No upper extremity supported, Feet supported, Bilateral upper extremity supported Sitting balance-Leahy Scale: Poor Sitting balance - Comments: at times relies on UE support as pt with posterior lean at times. Postural control: Posterior lean Standing balance support: Bilateral upper extremity supported, During functional activity Standing balance-Leahy Scale: Zero Standing balance comment: could not stand fully  upright even with mod assist and device                             Pertinent Vitals/Pain Pain Assessment Pain Assessment: Faces Faces Pain Scale: Hurts whole lot Pain Location: right hip Pain Descriptors / Indicators:  Aching, Discomfort, Grimacing, Guarding Pain Intervention(s): Limited activity within patient's tolerance, Monitored during session, Repositioned    Home Living Family/patient expects to be discharged to:: Skilled nursing facility                   Additional Comments: In 2018 pt with assist per chart but unsure of home situation now and no family present. Pt confused and unable to answer questikons.    Prior Function                       Hand Dominance        Extremity/Trunk Assessment   Upper Extremity Assessment Upper Extremity Assessment: Defer to OT evaluation    Lower Extremity Assessment Lower Extremity Assessment: RLE deficits/detail RLE Deficits / Details: grossly 2+/5 except unable to fully test hip RLE: Unable to fully assess due to pain    Cervical / Trunk Assessment Cervical / Trunk Assessment: Kyphotic  Communication   Communication: No difficulties  Cognition Arousal/Alertness: Awake/alert Behavior During Therapy: Flat affect Overall Cognitive Status: History of cognitive impairments - at baseline                                 General Comments: Pt with dementia at baseline. Only oriented to person.        General Comments General comments (skin integrity, edema, etc.): VSS    Exercises General Exercises - Lower Extremity Ankle Circles/Pumps: AROM, Both, 10 reps, Supine Quad Sets: AROM, Both, 10 reps, Supine Heel Slides: AROM, Both, 10 reps, Supine   Assessment/Plan    PT Assessment Patient needs continued PT services  PT Problem List Decreased strength;Decreased range of motion;Decreased activity tolerance;Decreased balance;Decreased mobility;Decreased knowledge of use of DME;Decreased safety awareness;Decreased knowledge of precautions;Cardiopulmonary status limiting activity;Pain       PT Treatment Interventions DME instruction;Gait training;Functional mobility training;Therapeutic activities;Therapeutic  exercise;Balance training;Patient/family education    PT Goals (Current goals can be found in the Care Plan section)  Acute Rehab PT Goals Patient Stated Goal: to not have pain PT Goal Formulation: Patient unable to participate in goal setting Time For Goal Achievement: 04/23/22 Potential to Achieve Goals: Good    Frequency Min 3X/week     Co-evaluation               AM-PAC PT "6 Clicks" Mobility  Outcome Measure Help needed turning from your back to your side while in a flat bed without using bedrails?: A Lot Help needed moving from lying on your back to sitting on the side of a flat bed without using bedrails?: A Lot Help needed moving to and from a bed to a chair (including a wheelchair)?: A Lot Help needed standing up from a chair using your arms (e.g., wheelchair or bedside chair)?: A Lot Help needed to walk in hospital room?: Total Help needed climbing 3-5 steps with a railing? : Total 6 Click Score: 10    End of Session Equipment Utilized During Treatment: Gait belt Activity Tolerance: Patient limited by fatigue;Patient limited by pain Patient left: in chair;with call bell/phone within  reach;with chair alarm set Nurse Communication: Mobility status;Need for lift equipment (use STedy to get pt back to bed) PT Visit Diagnosis: Unsteadiness on feet (R26.81);Muscle weakness (generalized) (M62.81);Pain Pain - Right/Left: Right Pain - part of body: Hip    Time: 0037-9444 PT Time Calculation (min) (ACUTE ONLY): 25 min   Charges:   PT Evaluation $PT Eval Moderate Complexity: 1 Mod PT Treatments $Therapeutic Activity: 8-22 mins        [facility] M,PT Acute Rehab Services [phone]   Alvira Philips 04/09/2022, 1:07 PM

## 2022-04-09 NOTE — Progress Notes (Signed)
     Subjective:  Patient reports pain as mild.  Up to chair this afternoon. Daughter at bedside. No concerns. Denies distal n/t.  Objective:   VITALS:   Vitals:   04/08/22 2006 04/08/22 2334 04/09/22 0329 04/09/22 0826  BP: 105/61 (!) 140/66 135/78 129/68  Pulse: 72 66 68 77  Resp:    15  Temp: 97.7 F (36.5 C) 97.7 F (36.5 C) (!) 97.5 F (36.4 C) 98.4 F (36.9 C)  TempSrc: Oral Oral Oral Oral  SpO2: 94% 100% 97% 93%  Weight:      Height:        Sensation intact distally Intact pulses distally Dorsiflexion/Plantar flexion intact Incision: dressing C/D/I Compartment soft   Lab Results  Component Value Date   WBC 13.3 (H) 04/09/2022   HGB 13.5 04/09/2022   HCT 39.0 04/09/2022   MCV 89.4 04/09/2022   PLT 312 04/09/2022   BMET    Component Value Date/Time   NA 136 04/09/2022 0646   K 3.6 04/09/2022 0646   CL 98 04/09/2022 0646   CO2 29 04/09/2022 0646   GLUCOSE 111 (H) 04/09/2022 0646   BUN 17 04/09/2022 0646   CREATININE 0.69 04/09/2022 0646   CALCIUM 8.8 (L) 04/09/2022 0646   GFRNONAA >60 04/09/2022 0646      Xray: hemi components in good position no adverse features.  Assessment/Plan: 1 Day Post-Op   Principal Problem:   Hip fracture ([place]) Active Problems:   HLD (hyperlipidemia)   Essential hypertension   Alzheimer's dementia ([place])  S/p R hip hemiarthroplasty 04/08/22     Post op recs: WB: WBAT with no formal hip precautions Abx: ancef x23 hours post op Imaging: PACU xrays Dressing: Aquacel dressing to be kept intact until follow-up DVT prophylaxis: Aspirin 81 mg twice daily starting postop day 1 x 4 weeks Follow up: 2 weeks after surgery for a wound check with Dr. Zachery Dakins at [facility].  Address: [address] [place], [place], [place] [address]  Office Phone: [phone]      Willaim Sheng 04/09/2022, 1:08 PM   Charlies Constable, MD  Contact information:   [phone] 7am-5pm epic message Dr.  Zachery Dakins, or call office for patient follow up: [phone] After hours and holidays please check [email or web address] for group call information for [facility]

## 2022-04-10 ENCOUNTER — Encounter (HOSPITAL_COMMUNITY): Payer: Self-pay | Admitting: Orthopedic Surgery

## 2022-04-10 ENCOUNTER — Inpatient Hospital Stay (HOSPITAL_COMMUNITY): Payer: Medicare (Managed Care)

## 2022-04-10 DIAGNOSIS — I10 Essential (primary) hypertension: Secondary | ICD-10-CM | POA: Diagnosis not present

## 2022-04-10 DIAGNOSIS — G309 Alzheimer's disease, unspecified: Secondary | ICD-10-CM | POA: Diagnosis not present

## 2022-04-10 DIAGNOSIS — F0280 Dementia in other diseases classified elsewhere without behavioral disturbance: Secondary | ICD-10-CM | POA: Diagnosis not present

## 2022-04-10 DIAGNOSIS — S72001A Fracture of unspecified part of neck of right femur, initial encounter for closed fracture: Secondary | ICD-10-CM | POA: Diagnosis not present

## 2022-04-10 MED ORDER — SERTRALINE HCL 50 MG PO TABS: 75.0000 mg | ORAL_TABLET | Freq: Every day | ORAL | Status: AC

## 2022-04-10 MED ORDER — ASPIRIN 81 MG PO TBEC: 81.0000 mg | DELAYED_RELEASE_TABLET | Freq: Two times a day (BID) | ORAL | 0 refills | Status: AC

## 2022-04-10 MED ORDER — SENNA 8.6 MG PO TABS: 1.0000 | ORAL_TABLET | Freq: Two times a day (BID) | ORAL | 0 refills | Status: AC

## 2022-04-10 MED ORDER — AMLODIPINE BESYLATE 5 MG PO TABS: 10.0000 mg | ORAL_TABLET | Freq: Every day | ORAL | Status: AC

## 2022-04-10 MED ORDER — OXYCODONE HCL 5 MG PO TABS: 2.5000 mg | ORAL_TABLET | ORAL | 0 refills | Status: AC | PRN

## 2022-04-10 NOTE — Progress Notes (Signed)
Mobility Specialist: Progress Note   04/10/22 1412  Mobility  Activity Stood at bedside  Level of Assistance Maximum assist, patient does 25-49%  Assistive Device Stedy  RLE Weight Bearing WBAT  Activity Response Tolerated fair  Mobility Referral Yes  $Mobility charge 1 Mobility   Pt received in the bed and agreeable to mobility. ModA with bed mobility with verbal and tactile cues for hand placement. C/o Rt hip pain, no rating given. ModA to stand from the bed with mild posterior bias. Attempted to stand x2 from [place] but unable to clear hips d/t BUE extension. Assisted back to bed and able to clear hips with HHA when standing. Pt back in bed with call bell at her side. Family member present in the room.   [place] Teresa Francis Mobility Specialist Please contact via SecureChat or Rehab office at [phone]

## 2022-04-10 NOTE — Progress Notes (Signed)
TRH night cross cover note:   I was notified by RN that this patient, who is here with acute right femoral neck fracture status post right hemiarthroplasty on 04/09/2022, was found to be mildly hypoxic on room air per routine vital signs, specifically noting oxygen saturation to be 88% on room air, with ensuing improvement into the mid 90s on 2 L nasal cannula.  While she was requiring this degree of supplemental oxygen yesterday, she was able to be weaned off of supplemental oxygen in the interval, and was maintaining oxygen saturations in the mid 90s on room air leading up to the aforementioned most recent vital sign check.  Patient without any acute complaints at this time, including no report of any acute shortness of breath or chest pain.  Additional vital signs appear stable, including afebrile, heart rates in the 70s, normotensive blood pressure, respiratory rate 14.  Will check chest x-ray to further evaluate.  Will also pursue additional pulmonary toilet, including incentive spirometry.     Babs Bertin, DO Hospitalist

## 2022-04-10 NOTE — Progress Notes (Signed)
Patient being DC with [facility]. [facility] arrieved to transport patient to [facility]. Pace drive did not take paper work with him. [facility] notified paper work was not taken with patient. [facility] to call driver

## 2022-04-10 NOTE — TOC Initial Note (Addendum)
Transition of Care [facility]) - Initial/Assessment Note    Patient Details  Name: Teresa Francis MRN: [id number] Date of Birth: 12-14-1940  [facility]) CM/SW Contact:    Jinger Neighbors, LCSW Phone Number: 04/10/2022, 9:48 AM  Clinical Narrative:                  CSW completed chart review and consulted with [name], pt's dtr, via phone to discuss SNF recommendations from PT. [name] reports pt currently resides at [facility] and she wants pt to return to [facility] with therapy. CSW attempted to contact admin at [facility] to discuss disposition, but admin currently in a meeting; will call back.  CSW completed work up and faxed out to [facility].  CSW returned a call to [place] at [facility] who reports they will work with Mendel Corning on SNF authorization and will transport pt from [facility] when she is ready for discharge. The number to call for transport when pt  is ready to d/c: [phone]  Expected Discharge Plan: [place] Barriers to Discharge: Continued Medical Work up   Patient Goals and CMS Choice            Expected Discharge Plan and Services       Living arrangements for the past 2 months: [place]                                      Prior Living Arrangements/Services Living arrangements for the past 2 months: [place] Lives with:: Facility Resident Patient language and need for interpreter reviewed:: Yes Do you feel safe going back to the place where you live?: Yes      Need for Family Participation in Patient Care: Yes (Comment) Care giver support system in place?: Yes (comment)   Criminal Activity/Legal Involvement Pertinent to Current Situation/Hospitalization: No - Comment as needed  Activities of Daily Living Home Assistive Devices/Equipment: None ADL Screening (condition at time of admission) Patient's cognitive ability adequate to safely complete daily activities?: No Is the  patient deaf or have difficulty hearing?: No Does the patient have difficulty seeing, even when wearing glasses/contacts?: No Does the patient have difficulty concentrating, remembering, or making decisions?: Yes Patient able to express need for assistance with ADLs?: Yes Does the patient have difficulty dressing or bathing?: Yes Independently performs ADLs?: No Communication: Needs assistance Is this a change from baseline?: Pre-admission baseline Dressing (OT): Needs assistance Is this a change from baseline?: Pre-admission baseline Grooming: Needs assistance Is this a change from baseline?: Pre-admission baseline Feeding: Independent Bathing: Needs assistance Is this a change from baseline?: Pre-admission baseline Toileting: Needs assistance Is this a change from baseline?: Pre-admission baseline In/Out Bed: Needs assistance Is this a change from baseline?: Pre-admission baseline Walks in Home: Needs assistance Is this a change from baseline?: Pre-admission baseline Does the patient have difficulty walking or climbing stairs?: Yes Weakness of Legs: Both Weakness of Arms/Hands: Both  Permission Sought/Granted                  Emotional Assessment              Admission diagnosis:  Hip fracture ([place]) [S72.009A] Closed displaced fracture of right femoral neck ([place]) [S72.001A] Patient Active Problem List   Diagnosis Date Noted   Hip fracture ([place]) 04/07/2022   Osteoporosis 10/25/2013   Gastroesophageal reflux disease  without esophagitis 09/20/2013   HLD (hyperlipidemia) 09/20/2013   Essential hypertension 09/20/2013   Alzheimer's dementia ([place]) 09/20/2013   Depression 09/20/2013   PCP:  Jearld Fenton, NP Pharmacy:   [facility] - [place], [place] [address] Phone: [phone] Fax: [phone]  [id number] [place], [place] [address] [place] [address] Phone: [phone] Fax: [phone]     Social Determinants of Health (SDOH) Social History: [place]: No Food Insecurity (04/07/2022)  Housing: Low Risk  (04/07/2022)  Transportation Needs: No Transportation Needs (04/07/2022)  Utilities: Not At Risk (04/07/2022)  Depression (PHQ2-9): Low Risk  (01/04/2019)  Tobacco Use: Low Risk  (04/08/2022)   SDOH Interventions:     Readmission Risk Interventions     No data to display

## 2022-04-10 NOTE — Progress Notes (Addendum)
PROGRESS NOTE    Teresa Francis  RCB:[id number] DOB: 06/28/1940 DOA: 04/07/2022 PCP: Jearld Fenton, NP   Brief Narrative:   82 y.o. female with medical history significant of dementia, HTN, and HLD presented with a fall.  She was found to have a right femoral neck fracture.  Orthopedics was consulted.  She underwent surgical intervention on 04/08/2022.  Assessment & Plan:   Right femoral neck fracture after a fall -Underwent right hip bipolar hemiarthroplasty on 04/08/2022.  Wound care and DVT prophylaxis as per orthopedics recommendations.  Continue pain management.  Fall precautions. -PT/OT recommend SNF placement.  TOC consulted.  Dementia -monitor mental status.  Delirium precautions.  Continue outpatient regimen of Namenda, Depakote, sertraline and melatonin  Leukocytosis -No labs today.  Hypokalemia -Resolved  Hypertension--continue amlodipine. Hydrochlorothiazide on hold because of hypokalemia.    Hyperlipidemia -Does not appear to be taking medications for this issue at this time.   DVT prophylaxis: SCDs Code Status: DNR Family Communication: None at bedside Disposition Plan: Status is: Inpatient Remains inpatient appropriate because: Of severity of illness.  Need for SNF placement   Consultants: Orthopedics  Procedures: As above  Antimicrobials: Perioperative   Subjective: Patient seen and examined at bedside.  Confused.  No fever, seizures, agitation or vomiting noted.   Objective: Vitals:   04/09/22 0826 04/09/22 1603 04/09/22 2004 04/10/22 0301  BP: 129/68 (!) 132/59 130/62 (!) 141/58  Pulse: 77 93 85 78  Resp: '15 14 15 14  '$ Temp: 98.4 F (36.9 C) 98.3 F (36.8 C) 98.5 F (36.9 C) 98.7 F (37.1 C)  TempSrc: Oral Oral Oral Oral  SpO2: 93% 92% 96% 94%  Weight:      Height:        Intake/Output Summary (Last 24 hours) at 04/10/2022 0736 Last data filed at 04/10/2022 0606 Gross per 24 hour  Intake 1131 ml  Output 400 ml  Net 731 ml     Filed Weights   04/08/22 1212  Weight: 70 kg    Examination:  General exam: Currently in 2 L oxygen via nasal cannula.  No distress.  Looks chronically ill and deconditioned.  Slow to respond.  Poor historian.  Awake but confused. Respiratory system: Bilateral decreased breath sounds at bases with some scattered crackles  cardiovascular system: Rate mostly controlled; S1 and S2 are heard Gastrointestinal system: Abdomen is mildly distended; soft and nontender.  Normal bowel sounds heard  extremities: No cyanosis or edema  Data Reviewed: I have personally reviewed following labs and imaging studies  CBC: Recent Labs  Lab 04/07/22 0822 04/08/22 0721 04/09/22 0646  WBC 9.2 9.0 13.3*  NEUTROABS 6.6  --   --   HGB 15.5* 14.6 13.5  HCT 46.0 44.2 39.0  MCV 89.8 90.6 89.4  PLT 287 292 803    Basic Metabolic Panel: Recent Labs  Lab 04/07/22 0822 04/08/22 0721 04/09/22 0646  NA 137 137 136  K 3.6 3.1* 3.6  CL 95* 98 98  CO2 32 27 29  GLUCOSE 96 97 111*  BUN '17 14 17  '$ CREATININE 0.63 0.84 0.69  CALCIUM 9.4 8.7* 8.8*    GFR: Estimated Creatinine Clearance: 55.7 mL/min (by C-G formula based on SCr of 0.69 mg/dL). Liver Function Tests: No results for input(s): "AST", "ALT", "ALKPHOS", "BILITOT", "PROT", "ALBUMIN" in the last 168 hours. No results for input(s): "LIPASE", "AMYLASE" in the last 168 hours. No results for input(s): "AMMONIA" in the last 168 hours. Coagulation Profile: No results for input(s): "INR", "PROTIME"  in the last 168 hours. Cardiac Enzymes: Recent Labs  Lab 04/07/22 0822  CKTOTAL 97    BNP (last 3 results) No results for input(s): "PROBNP" in the last 8760 hours. HbA1C: No results for input(s): "HGBA1C" in the last 72 hours. CBG: Recent Labs  Lab 04/07/22 0802  GLUCAP 83    Lipid Profile: No results for input(s): "CHOL", "HDL", "LDLCALC", "TRIG", "CHOLHDL", "LDLDIRECT" in the last 72 hours. Thyroid Function Tests: No results for  input(s): "TSH", "T4TOTAL", "FREET4", "T3FREE", "THYROIDAB" in the last 72 hours. Anemia Panel: No results for input(s): "VITAMINB12", "FOLATE", "FERRITIN", "TIBC", "IRON", "RETICCTPCT" in the last 72 hours. Sepsis Labs: No results for input(s): "PROCALCITON", "LATICACIDVEN" in the last 168 hours.  Recent Results (from the past 240 hour(s))  MRSA Next Gen by PCR, Nasal     Status: None   Collection Time: 04/07/22 11:30 PM   Specimen: Nasal Mucosa; Nasal Swab  Result Value Ref Range Status   MRSA by PCR Next Gen NOT DETECTED NOT DETECTED Final    Comment: (NOTE) The GeneXpert MRSA Assay (FDA approved for NASAL specimens only), is one component of a comprehensive MRSA colonization surveillance program. It is not intended to diagnose MRSA infection nor to guide or monitor treatment for MRSA infections. Test performance is not FDA approved in patients less than 28 years old. Performed at [facility], [place] [address] [place], [place] [address]          Radiology Studies: DG HIP UNILAT W OR W/O PELVIS 2-3 VIEWS RIGHT  Result Date: 04/08/2022 CLINICAL DATA:  Postoperative state. EXAM: DG HIP (WITH OR WITHOUT PELVIS) 2-3V RIGHT COMPARISON:  Preoperative imaging. FINDINGS: Right hip arthroplasty in expected alignment. No periprosthetic lucency or fracture. Recent postsurgical change includes air and edema in the soft tissues. IMPRESSION: Right hip arthroplasty without immediate postoperative complication. Electronically Signed   By: Keith Rake M.D.   On: 04/08/2022 18:07        Scheduled Meds:  amLODipine  10 mg Oral Daily   aspirin EC  81 mg Oral BID   divalproex  375 mg Oral QPM   divalproex  500 mg Oral Daily   docusate sodium  100 mg Oral BID   feeding supplement  237 mL Oral BID BM   fluticasone  1 spray Each Nare QHS   melatonin  5 mg Oral QHS,MR X 1   memantine  28 mg Oral Daily   multivitamin with minerals  1 tablet Oral Daily   pantoprazole  20 mg Oral BID    sertraline  75 mg Oral Daily   Continuous Infusions:  methocarbamol (ROBAXIN) IV 500 mg (04/08/22 0006)          Aline August, MD [facility] 04/10/2022, 7:36 AM '

## 2022-04-10 NOTE — Progress Notes (Signed)
Patient o2 saturation at 88% on room air during vitals. Patient placed on 2 liters. O2 responded to 94%. [place] Do notified of new O2 requirements. All other vitals remain within normal limits at this time

## 2022-04-10 NOTE — Progress Notes (Signed)
     Subjective: Patient lying comfortably in bed this morning. Episode of mild hypoxia overnight to 88% on RA. No other symptoms. Hemodynamically stable. Plan for DC to SNF.  Objective:   VITALS:   Vitals:   04/09/22 0826 04/09/22 1603 04/09/22 2004 04/10/22 0301  BP: 129/68 (!) 132/59 130/62 (!) 141/58  Pulse: 77 93 85 78  Resp: '15 14 15 14  '$ Temp: 98.4 F (36.9 C) 98.3 F (36.8 C) 98.5 F (36.9 C) 98.7 F (37.1 C)  TempSrc: Oral Oral Oral Oral  SpO2: 93% 92% 96% 94%  Weight:      Height:        Sensation intact distally Intact pulses distally Dorsiflexion/Plantar flexion intact Incision: dressing C/D/I Compartment soft   Lab Results  Component Value Date   WBC 13.3 (H) 04/09/2022   HGB 13.5 04/09/2022   HCT 39.0 04/09/2022   MCV 89.4 04/09/2022   PLT 312 04/09/2022   BMET    Component Value Date/Time   NA 136 04/09/2022 0646   K 3.6 04/09/2022 0646   CL 98 04/09/2022 0646   CO2 29 04/09/2022 0646   GLUCOSE 111 (H) 04/09/2022 0646   BUN 17 04/09/2022 0646   CREATININE 0.69 04/09/2022 0646   CALCIUM 8.8 (L) 04/09/2022 0646   GFRNONAA >60 04/09/2022 0646      Xray: hemi components in good position no adverse features.  Assessment/Plan: 2 Days Post-Op   Principal Problem:   Hip fracture ([place]) Active Problems:   HLD (hyperlipidemia)   Essential hypertension   Alzheimer's dementia ([place])  S/p R hip hemiarthroplasty 04/08/22     Post op recs: WB: WBAT with no formal hip precautions Abx: ancef x23 hours post op Imaging: PACU xrays Dressing: Aquacel dressing to be kept intact until follow-up DVT prophylaxis: Aspirin 81 mg twice daily starting postop day 1 x 4 weeks Follow up: 2 weeks after surgery for a wound check with Dr. Zachery Dakins at [facility].  Address: [address] [place], [place], [place] [address]  Office Phone: [phone]      Teresa Francis 04/10/2022, 6:05 AM   Charlies Constable, MD  Contact  information:   [phone] 7am-5pm epic message Dr. Zachery Dakins, or call office for patient follow up: [phone] After hours and holidays please check [email or web address] for group call information for [facility]

## 2022-04-10 NOTE — Discharge Summary (Signed)
Physician Discharge Summary  CRESTA RIDEN ZDG:[id number] DOB: 1940-10-03 DOA: 04/07/2022  PCP: Jearld Fenton, NP  Admit date: 04/07/2022 Discharge date: 04/10/2022  Admitted From: SNF Disposition: SNF  Recommendations for Outpatient Follow-up:  Follow up with SNF provider at earliest convenience Outpatient follow-up with orthopedics.  Discharge wound care/pain management/DVT prophylaxis as per orthopedics recommendations Follow up in ED if symptoms worsen or new appear   Home Health: No Equipment/Devices: None  Discharge Condition: Guarded CODE STATUS: DNR  diet recommendation: Heart healthy  Brief/Interim Summary:  82 y.o. female with medical history significant of dementia, HTN, and HLD presented with a fall.  She was found to have a right femoral neck fracture.  Orthopedics was consulted.  She underwent surgical intervention on 04/08/2022.  Subsequently, she has remained stable.  PT recommended SNF placement.  She will be discharged to SNF once bed is available.  Discharge Diagnoses:   Right femoral neck fracture after a fall -Underwent right hip bipolar hemiarthroplasty on 04/08/2022.  Wound care/pain management/DVT prophylaxis as per orthopedics recommendations. Fall precautions. -PT/OT recommend SNF placement.  She will be discharged to SNF once bed is available.   Dementia -Delirium precautions.  Continue outpatient regimen of Namenda, Depakote, sertraline and melatonin   Leukocytosis -No labs today.   Hypokalemia -Resolved   Hypertension--continue amlodipine. Hydrochlorothiazide to remain on hold because of hypokalemia and blood pressure being on the lower side.     Hyperlipidemia -Does not appear to be taking medications for this issue at this time.  Discharge Instructions  Discharge Instructions     Diet - low sodium heart healthy   Complete by: As directed    Increase activity slowly   Complete by: As directed       Allergies as of 04/10/2022        Reactions   Lisinopril Cough        Medication List     STOP taking these medications    aspirin 81 MG tablet Replaced by: aspirin EC 81 MG tablet   cefTRIAXone  IVPB Commonly known as: ROCEPHIN   hydrochlorothiazide 12.5 MG tablet Commonly known as: HYDRODIURIL       TAKE these medications    acetaminophen 325 MG tablet Commonly known as: TYLENOL Take 2 tablets (650 mg total) by mouth every 6 (six) hours as needed for mild pain. What changed: when to take this   amLODipine 5 MG tablet Commonly known as: NORVASC Take 2 tablets (10 mg total) by mouth daily.   aspirin EC 81 MG tablet Take 1 tablet (81 mg total) by mouth 2 (two) times daily. Swallow whole. Replaces: aspirin 81 MG tablet   bisacodyl 5 MG EC tablet Generic drug: bisacodyl Take 10 mg by mouth daily as needed for moderate constipation.   CALCIUM 600/VITAMIN D PO Take 1 tablet by mouth daily.   cyanocobalamin 1000 MCG tablet Commonly known as: VITAMIN B12 Take 1,000 mcg by mouth daily.   divalproex 125 MG DR tablet Commonly known as: DEPAKOTE Take 375 mg by mouth every evening.   divalproex 500 MG DR tablet Commonly known as: DEPAKOTE Take 1 tablet by mouth daily.   fluticasone 50 MCG/ACT nasal spray Commonly known as: FLONASE Place 1 spray into both nostrils at bedtime.   melatonin 5 MG Tabs Take 1 tablet by mouth at bedtime and may repeat dose one time if needed.   MEMANTINE HCL ER PO Take 28 mg by mouth daily.   oxyCODONE 5 MG immediate release tablet Commonly  known as: Oxy IR/ROXICODONE Take 0.5-1 tablets (2.5-5 mg total) by mouth every 4 (four) hours as needed for up to 7 days for severe pain or moderate pain.   pantoprazole 40 MG tablet Commonly known as: PROTONIX Take 1 tablet (40 mg total) by mouth 2 (two) times daily. What changed: how much to take   senna 8.6 MG Tabs tablet Commonly known as: SENOKOT Take 1 tablet (8.6 mg total) by mouth 2 (two) times daily.    sertraline 50 MG tablet Commonly known as: ZOLOFT Take 1.5 tablets (75 mg total) by mouth daily.   Vitamin D 50 MCG (2000 UT) Caps Take 2,000 Units by mouth daily.   vitamin E 180 MG (400 UNITS) capsule Take 400 Units by mouth daily.        Follow-up Information     Willaim Sheng, MD Follow up in 2 week(s).   Specialty: Orthopedic Surgery Contact information: [address] [place] [address] [place] [address] [phone]                Allergies  Allergen Reactions   Lisinopril Cough    Consultations: Orthopedics   Procedures/Studies: DG Chest Port 1 View  Result Date: 04/10/2022 CLINICAL DATA:  Hypoxia.  Shortness of breath. EXAM: PORTABLE CHEST 1 VIEW COMPARISON:  Radiographs 04/07/2022 and 07/12/2016. FINDINGS: The heart size and mediastinal contours are stable with aortic atherosclerosis. Similar mild chronic lung disease with left basilar scarring. No edema, confluent airspace opacity, pneumothorax or significant pleural effusion identified. The bones appear unchanged. IMPRESSION: Stable chest with mild chronic lung disease. No acute cardiopulmonary process. Electronically Signed   By: Richardean Sale M.D.   On: 04/10/2022 08:14   DG HIP UNILAT W OR W/O PELVIS 2-3 VIEWS RIGHT  Result Date: 04/08/2022 CLINICAL DATA:  Postoperative state. EXAM: DG HIP (WITH OR WITHOUT PELVIS) 2-3V RIGHT COMPARISON:  Preoperative imaging. FINDINGS: Right hip arthroplasty in expected alignment. No periprosthetic lucency or fracture. Recent postsurgical change includes air and edema in the soft tissues. IMPRESSION: Right hip arthroplasty without immediate postoperative complication. Electronically Signed   By: Keith Rake M.D.   On: 04/08/2022 18:07   CT Head Wo Contrast  Result Date: 04/07/2022 CLINICAL DATA:  Head trauma. EXAM: CT HEAD WITHOUT CONTRAST TECHNIQUE: Contiguous axial images were obtained from the base of the skull through the vertex without intravenous  contrast. RADIATION DOSE REDUCTION: This exam was performed according to the departmental dose-optimization program which includes automated exposure control, adjustment of the mA and/or kV according to patient size and/or use of iterative reconstruction technique. COMPARISON:  07/11/2016. FINDINGS: Brain: There is periventricular white matter decreased attenuation consistent with small vessel ischemic changes. Ventricles, sulci and cisterns are prominent consistent with age related involutional changes. No acute intracranial hemorrhage, mass effect or shift. No hydrocephalus. Vascular: No hyperdense vessel or unexpected calcification. Skull: Normal. Negative for fracture or focal lesion. Sinuses/Orbits: No acute finding. IMPRESSION: Atrophy and chronic small vessel ischemic changes. No acute intracranial process identified. Electronically Signed   By: Sammie Bench M.D.   On: 04/07/2022 09:20   DG Femur Min 2 Views Right  Result Date: 04/07/2022 CLINICAL DATA:  Fall.  Deformity and foreshortening of right leg. EXAM: RIGHT FEMUR 2 VIEWS; PELVIS - 1-2 VIEW; RIGHT KNEE - 1-2 VIEW COMPARISON:  CT right knee 07/12/2016 FINDINGS: There is diffuse decreased bone mineralization. Pelvis and right femur: There is an acute fracture of the right femoral neck with mild varus angulation. Likely mild superior displacement  of the distal fracture component with respect to the proximal fracture component. Moderate superior right greater than left femoroacetabular joint space narrowing. Mild bilateral superolateral acetabular degenerative osteophytes. Mild bilateral sacroiliac subchondral sclerosis. There is mild irregularity of the superior aspect of the right pubic body which appears chronic, however recommend clinical correlation for point tenderness to exclude an acute fracture. Mild-to-moderate superior pubic symphysis joint space narrowing. Right knee: Moderate to severe medial and moderate lateral compartment joint  space narrowing with mild peripheral degenerative osteophytes. No lateral view was achieved, and on limited oblique views there appears to be at least mild patellofemoral joint space narrowing and peripheral osteophytosis. There is a small sclerotic focus within the proximal fibula which is similar to prior CT and compatible with a benign enchondroma. No definite knee joint effusion. IMPRESSION: 1. Acute fracture of the right femoral neck with mild varus angulation. 2. There is mild irregularity of the superior aspect of the right pubic body which appears chronic, however recommend clinical correlation for point tenderness to exclude an acute fracture. Electronically Signed   By: Yvonne Kendall M.D.   On: 04/07/2022 09:06   DG Pelvis 1-2 Views  Result Date: 04/07/2022 CLINICAL DATA:  Fall.  Deformity and foreshortening of right leg. EXAM: RIGHT FEMUR 2 VIEWS; PELVIS - 1-2 VIEW; RIGHT KNEE - 1-2 VIEW COMPARISON:  CT right knee 07/12/2016 FINDINGS: There is diffuse decreased bone mineralization. Pelvis and right femur: There is an acute fracture of the right femoral neck with mild varus angulation. Likely mild superior displacement of the distal fracture component with respect to the proximal fracture component. Moderate superior right greater than left femoroacetabular joint space narrowing. Mild bilateral superolateral acetabular degenerative osteophytes. Mild bilateral sacroiliac subchondral sclerosis. There is mild irregularity of the superior aspect of the right pubic body which appears chronic, however recommend clinical correlation for point tenderness to exclude an acute fracture. Mild-to-moderate superior pubic symphysis joint space narrowing. Right knee: Moderate to severe medial and moderate lateral compartment joint space narrowing with mild peripheral degenerative osteophytes. No lateral view was achieved, and on limited oblique views there appears to be at least mild patellofemoral joint space  narrowing and peripheral osteophytosis. There is a small sclerotic focus within the proximal fibula which is similar to prior CT and compatible with a benign enchondroma. No definite knee joint effusion. IMPRESSION: 1. Acute fracture of the right femoral neck with mild varus angulation. 2. There is mild irregularity of the superior aspect of the right pubic body which appears chronic, however recommend clinical correlation for point tenderness to exclude an acute fracture. Electronically Signed   By: Yvonne Kendall M.D.   On: 04/07/2022 09:06   DG Knee 1-2 Views Right  Result Date: 04/07/2022 CLINICAL DATA:  Fall.  Deformity and foreshortening of right leg. EXAM: RIGHT FEMUR 2 VIEWS; PELVIS - 1-2 VIEW; RIGHT KNEE - 1-2 VIEW COMPARISON:  CT right knee 07/12/2016 FINDINGS: There is diffuse decreased bone mineralization. Pelvis and right femur: There is an acute fracture of the right femoral neck with mild varus angulation. Likely mild superior displacement of the distal fracture component with respect to the proximal fracture component. Moderate superior right greater than left femoroacetabular joint space narrowing. Mild bilateral superolateral acetabular degenerative osteophytes. Mild bilateral sacroiliac subchondral sclerosis. There is mild irregularity of the superior aspect of the right pubic body which appears chronic, however recommend clinical correlation for point tenderness to exclude an acute fracture. Mild-to-moderate superior pubic symphysis joint space narrowing. Right knee: Moderate  to severe medial and moderate lateral compartment joint space narrowing with mild peripheral degenerative osteophytes. No lateral view was achieved, and on limited oblique views there appears to be at least mild patellofemoral joint space narrowing and peripheral osteophytosis. There is a small sclerotic focus within the proximal fibula which is similar to prior CT and compatible with a benign enchondroma. No definite  knee joint effusion. IMPRESSION: 1. Acute fracture of the right femoral neck with mild varus angulation. 2. There is mild irregularity of the superior aspect of the right pubic body which appears chronic, however recommend clinical correlation for point tenderness to exclude an acute fracture. Electronically Signed   By: Yvonne Kendall M.D.   On: 04/07/2022 09:06   DG Chest 1 View  Result Date: 04/07/2022 CLINICAL DATA:  Fall.  Deformity and foreshortening of right leg. EXAM: CHEST  1 VIEW COMPARISON:  AP chest 07/12/2016 FINDINGS: Cardiac silhouette and mediastinal contours are unchanged and within normal limits with mild calcification within the aortic arch. Mild bilateral interstitial thickening is similar to prior and chronic. No focal airspace opacity. No pleural effusion or pneumothorax. No definite acute displaced rib fracture is seen. IMPRESSION: 1. No acute cardiopulmonary process. 2. Chronic interstitial thickening. 3. No definite acute displaced rib fracture. Electronically Signed   By: Yvonne Kendall M.D.   On: 04/07/2022 09:02      Subjective: Patient seen and examined at bedside.  Confused.  No fever, seizures, agitation or vomiting noted.     Discharge Exam: Vitals:   04/10/22 0301 04/10/22 0821  BP: (!) 141/58 (!) 139/58  Pulse: 78 81  Resp: 14 16  Temp: 98.7 F (37.1 C) 97.7 F (36.5 C)  SpO2: 94% 97%    General exam: Currently in 2 L oxygen via nasal cannula.  No distress.  Looks chronically ill and deconditioned.  Slow to respond.  Poor historian.  Awake but confused. Respiratory system: Bilateral decreased breath sounds at bases with some scattered crackles  cardiovascular system: Rate mostly controlled; S1 and S2 are heard Gastrointestinal system: Abdomen is mildly distended; soft and nontender.  Normal bowel sounds heard  extremities: No cyanosis or edema    The results of significant diagnostics from this hospitalization (including imaging, microbiology, ancillary  and laboratory) are listed below for reference.     Microbiology: Recent Results (from the past 240 hour(s))  MRSA Next Gen by PCR, Nasal     Status: None   Collection Time: 04/07/22 11:30 PM   Specimen: Nasal Mucosa; Nasal Swab  Result Value Ref Range Status   MRSA by PCR Next Gen NOT DETECTED NOT DETECTED Final    Comment: (NOTE) The GeneXpert MRSA Assay (FDA approved for NASAL specimens only), is one component of a comprehensive MRSA colonization surveillance program. It is not intended to diagnose MRSA infection nor to guide or monitor treatment for MRSA infections. Test performance is not FDA approved in patients less than 3 years old. Performed at [facility], [place] [address] [place], [place] [address]      Labs: BNP (last 3 results) No results for input(s): "BNP" in the last 8760 hours. Basic Metabolic Panel: Recent Labs  Lab 04/07/22 0822 04/08/22 0721 04/09/22 0646  NA 137 137 136  K 3.6 3.1* 3.6  CL 95* 98 98  CO2 32 27 29  GLUCOSE 96 97 111*  BUN '17 14 17  '$ CREATININE 0.63 0.84 0.69  CALCIUM 9.4 8.7* 8.8*   Liver Function Tests: No results for input(s): "  AST", "ALT", "ALKPHOS", "BILITOT", "PROT", "ALBUMIN" in the last 168 hours. No results for input(s): "LIPASE", "AMYLASE" in the last 168 hours. No results for input(s): "AMMONIA" in the last 168 hours. CBC: Recent Labs  Lab 04/07/22 0822 04/08/22 0721 04/09/22 0646  WBC 9.2 9.0 13.3*  NEUTROABS 6.6  --   --   HGB 15.5* 14.6 13.5  HCT 46.0 44.2 39.0  MCV 89.8 90.6 89.4  PLT 287 292 312   Cardiac Enzymes: Recent Labs  Lab 04/07/22 0822  CKTOTAL 97   BNP: Invalid input(s): "POCBNP" CBG: Recent Labs  Lab 04/07/22 0802  GLUCAP 83   D-Dimer No results for input(s): "DDIMER" in the last 72 hours. Hgb A1c No results for input(s): "HGBA1C" in the last 72 hours. Lipid Profile No results for input(s): "CHOL", "HDL", "LDLCALC", "TRIG", "CHOLHDL", "LDLDIRECT" in the last 72  hours. Thyroid function studies No results for input(s): "TSH", "T4TOTAL", "T3FREE", "THYROIDAB" in the last 72 hours.  Invalid input(s): "FREET3" Anemia work up No results for input(s): "VITAMINB12", "FOLATE", "FERRITIN", "TIBC", "IRON", "RETICCTPCT" in the last 72 hours. Urinalysis    Component Value Date/Time   COLORURINE YELLOW 04/07/2022 1000   APPEARANCEUR CLEAR 04/07/2022 1000   LABSPEC 1.011 04/07/2022 1000   PHURINE 8.0 04/07/2022 1000   GLUCOSEU NEGATIVE 04/07/2022 1000   HGBUR NEGATIVE 04/07/2022 1000   BILIRUBINUR NEGATIVE 04/07/2022 1000   KETONESUR NEGATIVE 04/07/2022 1000   PROTEINUR NEGATIVE 04/07/2022 1000   NITRITE NEGATIVE 04/07/2022 1000   LEUKOCYTESUR NEGATIVE 04/07/2022 1000   Sepsis Labs Recent Labs  Lab 04/07/22 0822 04/08/22 0721 04/09/22 0646  WBC 9.2 9.0 13.3*   Microbiology Recent Results (from the past 240 hour(s))  MRSA Next Gen by PCR, Nasal     Status: None   Collection Time: 04/07/22 11:30 PM   Specimen: Nasal Mucosa; Nasal Swab  Result Value Ref Range Status   MRSA by PCR Next Gen NOT DETECTED NOT DETECTED Final    Comment: (NOTE) The GeneXpert MRSA Assay (FDA approved for NASAL specimens only), is one component of a comprehensive MRSA colonization surveillance program. It is not intended to diagnose MRSA infection nor to guide or monitor treatment for MRSA infections. Test performance is not FDA approved in patients less than 50 years old. Performed at [facility], [place] [address], [place], [place] [address]      Time coordinating discharge: 35 minutes  SIGNED:   Aline August, MD  [facility] 04/10/2022, 1:17 PM

## 2022-04-10 NOTE — NC FL2 (Signed)
[place] LEVEL OF CARE FORM     IDENTIFICATION  Patient Name: Teresa Francis Birthdate: 10-May-1940 Sex: female Admission Date (Current Location): 04/07/2022  [facility] and [place] Number:  Herbalist and Address:  [facility] [place], [place] [address] [place], [place] [address]      Provider Number: [id number]  Attending Physician Name and Address:  Aline August, MD  Relative Name and Phone Number:  Bevelyn Buckles [phone]    Current Level of Care: Hospital Recommended Level of Care: [place] Prior Approval Number:    Date Approved/Denied:   PASRR Number: [id number]  Discharge Plan: Domiciliary (Rest home)    Current Diagnoses: Patient Active Problem List   Diagnosis Date Noted   Hip fracture ([place]) 04/07/2022   Osteoporosis 10/25/2013   Gastroesophageal reflux disease without esophagitis 09/20/2013   HLD (hyperlipidemia) 09/20/2013   Essential hypertension 09/20/2013   Alzheimer's dementia ([place]) 09/20/2013   Depression 09/20/2013    Orientation RESPIRATION BLADDER Height & Weight     Self  Normal Incontinent, External catheter Weight: 154 lb 5.2 oz (70 kg) Height:  '5\' 9"'$  (175.3 cm)  BEHAVIORAL SYMPTOMS/MOOD NEUROLOGICAL BOWEL NUTRITION STATUS      Continent Diet (See d/c summary)  AMBULATORY STATUS COMMUNICATION OF NEEDS Skin     Verbally Normal                       Personal Care Assistance Level of Assistance  Bathing, Feeding, Dressing Bathing Assistance: Maximum assistance Feeding assistance: Limited assistance Dressing Assistance: Maximum assistance     Functional Limitations Info  Sight, Speech, Hearing Sight Info: Adequate Hearing Info: Adequate Speech Info: Adequate    SPECIAL CARE FACTORS FREQUENCY  PT (By licensed PT), OT (By licensed OT)     PT Frequency: 5x/wk OT Frequency: 5x/wk            Contractures Contractures Info: Not present    Additional Factors Info   Code Status Code Status Info: DNR             Current Medications (04/10/2022):  This is the current hospital active medication list Current Facility-Administered Medications  Medication Dose Route Frequency Provider Last Rate Last Admin   acetaminophen (TYLENOL) tablet 650 mg  650 mg Oral Q6H PRN Willaim Sheng, MD   650 mg at 04/07/22 2103   amLODipine (NORVASC) tablet 10 mg  10 mg Oral Daily Willaim Sheng, MD   10 mg at 04/10/22 9379   aspirin EC tablet 81 mg  81 mg Oral BID Willaim Sheng, MD   81 mg at 04/10/22 0859   bisacodyl (DULCOLAX) EC tablet 10 mg  10 mg Oral Daily PRN Willaim Sheng, MD       divalproex (DEPAKOTE) DR tablet 375 mg  375 mg Oral QPM Willaim Sheng, MD   375 mg at 04/09/22 1611   divalproex (DEPAKOTE) DR tablet 500 mg  500 mg Oral Daily Willaim Sheng, MD   500 mg at 04/10/22 0240   docusate sodium (COLACE) capsule 100 mg  100 mg Oral BID Willaim Sheng, MD   100 mg at 04/10/22 0859   feeding supplement (ENSURE ENLIVE / ENSURE PLUS) liquid 237 mL  237 mL Oral BID BM Alekh, Kshitiz, MD   237 mL at 04/10/22 0900   fluticasone (FLONASE) 50 MCG/ACT nasal spray 1 spray  1 spray Each Nare QHS Willaim Sheng, MD  1 spray at 04/09/22 2131   melatonin tablet 5 mg  5 mg Oral QHS,MR X 1 Willaim Sheng, MD   5 mg at 04/09/22 2130   memantine (NAMENDA XR) 24 hr capsule 28 mg  28 mg Oral Daily Willaim Sheng, MD   28 mg at 04/10/22 0900   methocarbamol (ROBAXIN) tablet 500 mg  500 mg Oral Q6H PRN Willaim Sheng, MD   500 mg at 04/07/22 1443   Or   methocarbamol (ROBAXIN) 500 mg in dextrose 5 % 50 mL IVPB  500 mg Intravenous Q6H PRN Willaim Sheng, MD 110 mL/hr at 04/08/22 0006 500 mg at 04/08/22 0006   morphine (PF) 2 MG/ML injection 2 mg  2 mg Intravenous Q2H PRN Willaim Sheng, MD   2 mg at 04/07/22 1443   multivitamin with minerals tablet 1 tablet  1 tablet Oral Daily Aline August, MD   1  tablet at 04/10/22 0859   ondansetron (ZOFRAN) injection 4 mg  4 mg Intravenous Q6H PRN Willaim Sheng, MD       oxyCODONE (Oxy IR/ROXICODONE) immediate release tablet 2.5-5 mg  2.5-5 mg Oral Q4H PRN Willaim Sheng, MD   5 mg at 04/10/22 0903   pantoprazole (PROTONIX) EC tablet 20 mg  20 mg Oral BID Willaim Sheng, MD   20 mg at 04/10/22 0859   polyethylene glycol (MIRALAX / GLYCOLAX) packet 17 g  17 g Oral Daily PRN Willaim Sheng, MD       sertraline (ZOLOFT) tablet 75 mg  75 mg Oral Daily Willaim Sheng, MD   75 mg at 04/10/22 2800     Discharge Medications: Please see discharge summary for a list of discharge medications.  Relevant Imaging Results:  Relevant Lab Results:   Additional Information SS#: [id number]  Jinger Neighbors, LCSW

## 2022-04-10 NOTE — TOC Initial Note (Deleted)
Transition of Care [facility]) - Initial/Assessment Note    Patient Details  Name: Teresa Francis MRN: [id number] Date of Birth: 09-Sep-1940  Transition of Care [facility]) CM/SW Contact:    Amador Cunas, [place] Phone Number: 04/10/2022, 1:37 PM  Clinical Narrative: Pt for dc back to [facility] today. Spoke to [place] with [facility] who reports they will provide transport. Bethena Roys with [facility] confirmed they are prepared to admit pt to room 802A. RN provided with number for report. Pt's dtr at bedside and agreeable to dc. SW signing off at dc.   Wandra Feinstein, MSW, LCSW [phone] (coverage)                    Expected Discharge Plan: [place] Barriers to Discharge: No Barriers Identified   Patient Goals and CMS Choice     Choice offered to / list presented to : Adult Children      Expected Discharge Plan and Services       Living arrangements for the past 2 months: [place] Expected Discharge Date: 04/10/22                                    Prior Living Arrangements/Services Living arrangements for the past 2 months: [place] Lives with:: Facility Resident Patient language and need for interpreter reviewed:: Yes Do you feel safe going back to the place where you live?: Yes      Need for Family Participation in Patient Care: Yes (Comment) Care giver support system in place?: Yes (comment)   Criminal Activity/Legal Involvement Pertinent to Current Situation/Hospitalization: No - Comment as needed  Activities of Daily Living Home Assistive Devices/Equipment: None ADL Screening (condition at time of admission) Patient's cognitive ability adequate to safely complete daily activities?: No Is the patient deaf or have difficulty hearing?: No Does the patient have difficulty seeing, even when wearing glasses/contacts?: No Does the patient have difficulty concentrating, remembering, or making decisions?: Yes Patient able  to express need for assistance with ADLs?: Yes Does the patient have difficulty dressing or bathing?: Yes Independently performs ADLs?: No Communication: Needs assistance Is this a change from baseline?: Pre-admission baseline Dressing (OT): Needs assistance Is this a change from baseline?: Pre-admission baseline Grooming: Needs assistance Is this a change from baseline?: Pre-admission baseline Feeding: Independent Bathing: Needs assistance Is this a change from baseline?: Pre-admission baseline Toileting: Needs assistance Is this a change from baseline?: Pre-admission baseline In/Out Bed: Needs assistance Is this a change from baseline?: Pre-admission baseline Walks in Home: Needs assistance Is this a change from baseline?: Pre-admission baseline Does the patient have difficulty walking or climbing stairs?: Yes Weakness of Legs: Both Weakness of Arms/Hands: Both  Permission Sought/Granted                  Emotional Assessment              Admission diagnosis:  Hip fracture ([place]) [S72.009A] Closed displaced fracture of right femoral neck ([place]) [S72.001A] Patient Active Problem List   Diagnosis Date Noted   Hip fracture ([place]) 04/07/2022   Osteoporosis 10/25/2013   Gastroesophageal reflux disease without esophagitis 09/20/2013   HLD (hyperlipidemia) 09/20/2013   Essential hypertension 09/20/2013   Alzheimer's dementia ([place]) 09/20/2013   Depression 09/20/2013   PCP:  Jearld Fenton, NP Pharmacy:   [facility] - [place] [place] [place]  [place] [address] Phone: [phone] Fax: [phone]  [id number] [place], [place] - [address] [place] [address] Phone: [phone] Fax: [phone]     Social Determinants of Health (SDOH) Social History: [place]: No Food Insecurity (04/07/2022)   Housing: Low Risk  (04/07/2022)  Transportation Needs: No Transportation Needs (04/07/2022)  Utilities: Not At Risk (04/07/2022)  Depression (PHQ2-9): Low Risk  (01/04/2019)  Tobacco Use: Low Risk  (04/08/2022)   SDOH Interventions:     Readmission Risk Interventions     No data to display

## 2022-04-10 NOTE — TOC Transition Note (Signed)
Transition of Care [facility]) - CM/SW Discharge Note   Patient Details  Name: Teresa Francis MRN: [id number] Date of Birth: 1940/11/04  Transition of Care [facility]) CM/SW Contact:  Amador Cunas, [place] Phone Number: 04/10/2022, 2:27 PM   Clinical Narrative:    Pt for dc back to [facility] today. Spoke to [place] with [facility] who reports they will provide transport. Bethena Roys with [facility] confirmed they are prepared to admit pt to room 802A. RN provided with number for report. Pt's dtr at bedside and agreeable to dc. SW signing off at dc.      Final next level of care: [place] Barriers to Discharge: No Barriers Identified   Patient Goals and CMS Choice   Choice offered to / list presented to : Adult Children  Discharge Placement                Patient chooses bed at: [facility] Patient to be transferred to facility by: [place] Name of family member notified: Gail/dtr Patient and family notified of of transfer: 04/10/22  Discharge Plan and Services Additional resources added to the After Visit Summary for                                       Social Determinants of Health (SDOH) Interventions SDOH Screenings   Food Insecurity: No Food Insecurity (04/07/2022)  Housing: Low Risk  (04/07/2022)  Transportation Needs: No Transportation Needs (04/07/2022)  Utilities: Not At Risk (04/07/2022)  Depression (PHQ2-9): Low Risk  (01/04/2019)  Tobacco Use: Low Risk  (04/10/2022)     Readmission Risk Interventions     No data to display

## 2022-04-10 NOTE — Discharge Planning (Signed)
Patient being discharged to [facility] facility. Nurse called twice and was transferred to speak to the nurse receiving the patient, but no one answered. Patient is to be admitted to room 802A.

## 2022-07-17 ENCOUNTER — Other Ambulatory Visit (HOSPITAL_COMMUNITY): Payer: Self-pay | Admitting: Family Medicine

## 2022-07-17 DIAGNOSIS — I739 Peripheral vascular disease, unspecified: Secondary | ICD-10-CM

## 2022-08-06 ENCOUNTER — Encounter (HOSPITAL_COMMUNITY): Payer: Self-pay

## 2022-08-06 ENCOUNTER — Ambulatory Visit (HOSPITAL_COMMUNITY): Payer: Medicare (Managed Care)

## 2022-10-07 ENCOUNTER — Emergency Department (HOSPITAL_COMMUNITY): Payer: Medicare (Managed Care)

## 2022-10-07 ENCOUNTER — Emergency Department (HOSPITAL_COMMUNITY)
Admission: EM | Admit: 2022-10-07 | Discharge: 2022-10-08 | Disposition: A | Discharge status: Home / Self Care | Payer: Medicare (Managed Care) | Attending: Emergency Medicine | Admitting: Emergency Medicine

## 2022-10-07 ENCOUNTER — Other Ambulatory Visit: Payer: Self-pay

## 2022-10-07 ENCOUNTER — Encounter (HOSPITAL_COMMUNITY): Payer: Self-pay

## 2022-10-07 DIAGNOSIS — I10 Essential (primary) hypertension: Secondary | ICD-10-CM | POA: Diagnosis not present

## 2022-10-07 DIAGNOSIS — S0990XA Unspecified injury of head, initial encounter: Secondary | ICD-10-CM | POA: Diagnosis present

## 2022-10-07 DIAGNOSIS — S0083XA Contusion of other part of head, initial encounter: Secondary | ICD-10-CM | POA: Diagnosis not present

## 2022-10-07 DIAGNOSIS — W010XXA Fall on same level from slipping, tripping and stumbling without subsequent striking against object, initial encounter: Secondary | ICD-10-CM | POA: Insufficient documentation

## 2022-10-07 DIAGNOSIS — Z79899 Other long term (current) drug therapy: Secondary | ICD-10-CM | POA: Diagnosis not present

## 2022-10-07 DIAGNOSIS — F0390 Unspecified dementia without behavioral disturbance: Secondary | ICD-10-CM | POA: Insufficient documentation

## 2022-10-07 DIAGNOSIS — R911 Solitary pulmonary nodule: Secondary | ICD-10-CM | POA: Diagnosis not present

## 2022-10-07 DIAGNOSIS — W19XXXA Unspecified fall, initial encounter: Secondary | ICD-10-CM

## 2022-10-07 NOTE — ED Notes (Signed)
Cspine cleared to remove c collar per edp

## 2022-10-07 NOTE — ED Triage Notes (Signed)
Pt arrived from [facility] via [place] s/p witnessed mechanical fall resulting in hematoma to left forehead. Pt is a poor historian d/t hx dementia and Alzheimer's

## 2022-10-07 NOTE — ED Notes (Signed)
[facility] called for trasnport

## 2022-10-07 NOTE — ED Provider Notes (Signed)
EMERGENCY DEPARTMENT AT [facility] Provider Note   CSN: [id number] Arrival date & time: 10/07/22  2010     History  Chief Complaint  Patient presents with   Teresa Francis is a 82 y.o. female.  Level 5 caveat due to dementia.  Witnessed fall at nursing home.  Tripped and fell and hit her forehead.  Not on blood thinners.  She has no complaints.  Hematomata noted on forehead per EMS.  She denies any extremity pain or neck pain.  No chest pain or shortness of breath.  History of hypertension, high cholesterol.  Denies any other issues.  The history is provided by the patient.       Home Medications Prior to Admission medications   Medication Sig Start Date End Date Taking? Authorizing Provider  acetaminophen (TYLENOL) 325 MG tablet Take 2 tablets (650 mg total) by mouth every 6 (six) hours as needed for mild pain. Patient taking differently: Take 650 mg by mouth every 8 (eight) hours as needed for mild pain. 07/16/16    Phenix, PA-C  amLODipine (NORVASC) 5 MG tablet Take 2 tablets (10 mg total) by mouth daily. 04/10/22   Glade Lloyd, MD  bisacodyl 5 MG EC tablet Take 10 mg by mouth daily as needed for moderate constipation.    [provider]  Calcium Carb-Cholecalciferol (CALCIUM 600/VITAMIN D PO) Take 1 tablet by mouth daily.    [provider]  Cholecalciferol (VITAMIN D) 50 MCG (2000 UT) CAPS Take 2,000 Units by mouth daily.    [provider]  divalproex (DEPAKOTE) 125 MG DR tablet Take 375 mg by mouth every evening.    [provider]  divalproex (DEPAKOTE) 500 MG DR tablet Take 1 tablet by mouth daily. 12/05/18   [provider]  fluticasone (FLONASE) 50 MCG/ACT nasal spray Place 1 spray into both nostrils at bedtime.    [provider]  Melatonin 5 MG TABS Take 1 tablet by mouth at bedtime and may repeat dose one time if needed.     [provider]  MEMANTINE HCL ER PO  Take 28 mg by mouth daily.    [provider]  pantoprazole (PROTONIX) 40 MG tablet Take 1 tablet (40 mg total) by mouth 2 (two) times daily. Patient taking differently: Take 20 mg by mouth 2 (two) times daily. 01/07/19   Lorre Munroe, NP  senna (SENOKOT) 8.6 MG TABS tablet Take 1 tablet (8.6 mg total) by mouth 2 (two) times daily. 04/10/22   Glade Lloyd, MD  sertraline (ZOLOFT) 50 MG tablet Take 1.5 tablets (75 mg total) by mouth daily. 04/10/22   Glade Lloyd, MD  vitamin B-12 (CYANOCOBALAMIN) 1000 MCG tablet Take 1,000 mcg by mouth daily.    [provider]  vitamin E 400 UNIT capsule Take 400 Units by mouth daily.    [provider]      Allergies    Lisinopril    Review of Systems   Review of Systems  Physical Exam Updated Vital Signs BP (!) 152/78 (BP Location: Left Arm)   Pulse 64   Temp 97.6 F (36.4 C) (Oral)   Resp 18   SpO2 98%  Physical Exam Vitals and nursing note reviewed.  Constitutional:      General: She is not in acute distress.    Appearance: She is well-developed. She is not ill-appearing.  HENT:     Head:     Comments: Hematoma  to forehead Eyes:     Extraocular Movements: Extraocular movements intact.     Conjunctiva/sclera: Conjunctivae normal.     Pupils: Pupils are equal, round, and reactive to light.  Cardiovascular:     Rate and Rhythm: Normal rate and regular rhythm.     Pulses: Normal pulses.     Heart sounds: Normal heart sounds. No murmur heard. Pulmonary:     Effort: Pulmonary effort is normal. No respiratory distress.     Breath sounds: Normal breath sounds.  Abdominal:     Palpations: Abdomen is soft.     Tenderness: There is no abdominal tenderness.  Musculoskeletal:        General: No swelling or tenderness. Normal range of motion.     Cervical back: Normal range of motion and neck supple. No tenderness.     Comments: No midline spinal tenderness  Skin:    General: Skin is warm and dry.      Capillary Refill: Capillary refill takes less than 2 seconds.  Neurological:     General: No focal deficit present.     Mental Status: She is alert.     Cranial Nerves: No cranial nerve deficit.     Sensory: No sensory deficit.     Motor: No weakness.     Coordination: Coordination normal.     Comments: 5+ out of 5 strength throughout, normal sensation, no drift, normal finger-nose-finger, speech  Psychiatric:        Mood and Affect: Mood normal.     ED Results / Procedures / Treatments   Labs (all labs ordered are listed, but only abnormal results are displayed) Labs Reviewed - No data to display  EKG None  Radiology DG Pelvis 1-2 Views  Result Date: 10/07/2022 CLINICAL DATA:  Recent fall with pain, initial encounter EXAM: PELVIS - 1 VIEW COMPARISON:  04/07/2018 FINDINGS: Pelvic ring is intact. Right hip prosthesis is now seen. No soft tissue abnormality is noted. IMPRESSION: No acute abnormality noted. Electronically Signed   By: Alcide Clever M.D.   On: 10/07/2022 22:03   DG Chest 2 View  Result Date: 10/07/2022 CLINICAL DATA:  Recent fall with chest pain, initial encounter EXAM: CHEST - 2 VIEW COMPARISON:  04/10/2022 FINDINGS: Cardiac shadow is stable. Aortic calcifications are seen. Lungs are well aerated bilaterally. No focal infiltrate or effusion is seen. No bony abnormality is noted. IMPRESSION: No active cardiopulmonary disease. Electronically Signed   By: Alcide Clever M.D.   On: 10/07/2022 22:02   CT HEAD WO CONTRAST ( )  Result Date: 10/07/2022 CLINICAL DATA:  Head trauma, minor, normal mental status (Age 39-64y); Neck trauma (Age >= 65y) EXAM: CT HEAD WITHOUT CONTRAST CT CERVICAL SPINE WITHOUT CONTRAST TECHNIQUE: Multidetector CT imaging of the head and cervical spine was performed following the standard protocol without intravenous contrast. Multiplanar CT image reconstructions of the cervical spine were also generated. RADIATION DOSE REDUCTION: This exam was performed  according to the departmental dose-optimization program which includes automated exposure control, adjustment of the mA and/or kV according to patient size and/or use of iterative reconstruction technique. COMPARISON:  None Available. FINDINGS: CT HEAD FINDINGS Brain: No evidence of acute infarction, hemorrhage, hydrocephalus, extra-axial collection or mass lesion/mass effect. Sequela of mild chronic microvascular ischemic change. Vascular: No hyperdense vessel or unexpected calcification. Skull: Soft tissue hematoma along the left frontal scalp. No evidence of underlying calvarial fracture. Sinuses/Orbits: No middle ear or mastoid effusion. Paranasal sinuses are clear. Orbits are unremarkable. Other: None. CT CERVICAL SPINE  FINDINGS Alignment: Grade 1 anterolisthesis of C4 on C5 and C7 on T1. Skull base and vertebrae: No acute fracture. No primary bone lesion or focal pathologic process. Soft tissues and spinal canal: No prevertebral fluid or swelling. No visible canal hematoma. Disc levels:  No evidence of high-grade spinal canal stenosis. Upper chest: Incidentally noted is a 5 mm pulmonary nodule in the right upper lobe and an additional somewhat centrally lucent nodule in the right lung apex (series 3, image 71). Other: Enlarged and multinodular left thyroid goiter. IMPRESSION: 1. No acute intracranial abnormality. 2. Soft tissue hematoma along the left frontal scalp. No evidence of underlying calvarial fracture. 3. No acute fracture or traumatic malalignment of the cervical spine. 4. Incidentally noted is a 5 mm pulmonary nodule in the right upper lobe and an additional somewhat centrally lucent nodule in the right lung apex. Recommend further evaluation with a dedicated chest CT Electronically Signed   By: Lorenza Cambridge M.D.   On: 10/07/2022 21:23   CT Cervical Spine Wo Contrast  Result Date: 10/07/2022 CLINICAL DATA:  Head trauma, minor, normal mental status (Age 64-64y); Neck trauma (Age >= 65y) EXAM: CT  HEAD WITHOUT CONTRAST CT CERVICAL SPINE WITHOUT CONTRAST TECHNIQUE: Multidetector CT imaging of the head and cervical spine was performed following the standard protocol without intravenous contrast. Multiplanar CT image reconstructions of the cervical spine were also generated. RADIATION DOSE REDUCTION: This exam was performed according to the departmental dose-optimization program which includes automated exposure control, adjustment of the mA and/or kV according to patient size and/or use of iterative reconstruction technique. COMPARISON:  None Available. FINDINGS: CT HEAD FINDINGS Brain: No evidence of acute infarction, hemorrhage, hydrocephalus, extra-axial collection or mass lesion/mass effect. Sequela of mild chronic microvascular ischemic change. Vascular: No hyperdense vessel or unexpected calcification. Skull: Soft tissue hematoma along the left frontal scalp. No evidence of underlying calvarial fracture. Sinuses/Orbits: No middle ear or mastoid effusion. Paranasal sinuses are clear. Orbits are unremarkable. Other: None. CT CERVICAL SPINE FINDINGS Alignment: Grade 1 anterolisthesis of C4 on C5 and C7 on T1. Skull base and vertebrae: No acute fracture. No primary bone lesion or focal pathologic process. Soft tissues and spinal canal: No prevertebral fluid or swelling. No visible canal hematoma. Disc levels:  No evidence of high-grade spinal canal stenosis. Upper chest: Incidentally noted is a 5 mm pulmonary nodule in the right upper lobe and an additional somewhat centrally lucent nodule in the right lung apex (series 3, image 71). Other: Enlarged and multinodular left thyroid goiter. IMPRESSION: 1. No acute intracranial abnormality. 2. Soft tissue hematoma along the left frontal scalp. No evidence of underlying calvarial fracture. 3. No acute fracture or traumatic malalignment of the cervical spine. 4. Incidentally noted is a 5 mm pulmonary nodule in the right upper lobe and an additional somewhat  centrally lucent nodule in the right lung apex. Recommend further evaluation with a dedicated chest CT Electronically Signed   By: Lorenza Cambridge M.D.   On: 10/07/2022 21:23    Procedures Procedures    Medications Ordered in ED Medications - No data to display  ED Course/ Medical Decision Making/ A&P                             Medical Decision Making  Teresa Francis is here after witnessed fall at her skilled nursing facility.  History of dementia.  Level 5 caveat.  Overall she is well-appearing.  She has no  complaints.  She is on a blood thinners.  Unremarkable vitals.  No fever.  Hematoma to the forehead.  No midline spinal pain.  No extremity pain.  Chest x-ray, pelvic x-ray, CT head and neck were obtained.  Per my review and interpretation of labs I do not see any acute processes.  Radiology report with no injury of the chest or pelvis.  Incidental pulmonary nodule on CT chest.  Discharge summary recommending follow-up with primary care doctor for may be CT chest to further evaluate that.  Otherwise no traumatic findings on the head or neck CT other than hematoma of the forehead.  Discussed with family's.  Discharged in good condition.  Understands return precautions.  Recommend ice and Tylenol.  This chart was dictated using voice recognition software.  Despite best efforts to proofread,  errors can occur which can change the documentation meaning.         Final Clinical Impression(s) / ED Diagnoses Final diagnoses:  Fall, initial encounter  Pulmonary nodule    Rx / DC Orders ED Discharge Orders     None         Virgina Norfolk, DO 10/07/22 2209

## 2022-10-07 NOTE — Discharge Instructions (Addendum)
No major injuries today. Recommend ice for forhead  Follow up with primary care about this incidental finding:  incidentally noted is a 5 mm pulmonary nodule in the right upper  lobe and an additional somewhat centrally lucent nodule in the right  lung apex. Recommend further evaluation with a dedicated chest CT

## 2023-06-19 ENCOUNTER — Encounter (HOSPITAL_COMMUNITY): Payer: Self-pay

## 2023-06-19 ENCOUNTER — Emergency Department (HOSPITAL_COMMUNITY): Payer: Medicare (Managed Care)

## 2023-06-19 ENCOUNTER — Emergency Department (HOSPITAL_COMMUNITY)
Admission: EM | Admit: 2023-06-19 | Discharge: 2023-06-20 | Disposition: A | Discharge status: Home / Self Care | Payer: Medicare (Managed Care) | Attending: Emergency Medicine | Admitting: Emergency Medicine

## 2023-06-19 ENCOUNTER — Other Ambulatory Visit: Payer: Self-pay

## 2023-06-19 DIAGNOSIS — G309 Alzheimer's disease, unspecified: Secondary | ICD-10-CM | POA: Diagnosis not present

## 2023-06-19 DIAGNOSIS — F0390 Unspecified dementia without behavioral disturbance: Secondary | ICD-10-CM | POA: Insufficient documentation

## 2023-06-19 DIAGNOSIS — Z79899 Other long term (current) drug therapy: Secondary | ICD-10-CM | POA: Insufficient documentation

## 2023-06-19 DIAGNOSIS — M79661 Pain in right lower leg: Secondary | ICD-10-CM

## 2023-06-19 DIAGNOSIS — R2241 Localized swelling, mass and lump, right lower limb: Secondary | ICD-10-CM | POA: Diagnosis not present

## 2023-06-19 DIAGNOSIS — I10 Essential (primary) hypertension: Secondary | ICD-10-CM | POA: Insufficient documentation

## 2023-06-19 DIAGNOSIS — M79604 Pain in right leg: Secondary | ICD-10-CM | POA: Diagnosis present

## 2023-06-19 LAB — CBC WITH DIFFERENTIAL/PLATELET
Abs Immature Granulocytes: 0.02 10*3/uL (ref 0.00–0.07)
Basophils Absolute: 0.1 10*3/uL (ref 0.0–0.1)
Basophils Relative: 1 %
Eosinophils Absolute: 0 10*3/uL (ref 0.0–0.5)
Eosinophils Relative: 0 %
HCT: 37.9 % (ref 36.0–46.0)
Hemoglobin: 12.3 g/dL (ref 12.0–15.0)
Immature Granulocytes: 0 %
Lymphocytes Relative: 26 %
Lymphs Abs: 2.7 10*3/uL (ref 0.7–4.0)
MCH: 29.4 pg (ref 26.0–34.0)
MCHC: 32.5 g/dL (ref 30.0–36.0)
MCV: 90.5 fL (ref 80.0–100.0)
Monocytes Absolute: 1 10*3/uL (ref 0.1–1.0)
Monocytes Relative: 9 %
Neutro Abs: 6.8 10*3/uL (ref 1.7–7.7)
Neutrophils Relative %: 64 %
Platelets: 355 10*3/uL (ref 150–400)
RBC: 4.19 MIL/uL (ref 3.87–5.11)
RDW: 15.2 % (ref 11.5–15.5)
WBC: 10.5 10*3/uL (ref 4.0–10.5)
nRBC: 0 % (ref 0.0–0.2)

## 2023-06-19 LAB — COMPREHENSIVE METABOLIC PANEL WITH GFR
ALT: 12 U/L (ref 0–44)
AST: 19 U/L (ref 15–41)
Albumin: 3.6 g/dL (ref 3.5–5.0)
Alkaline Phosphatase: 81 U/L (ref 38–126)
Anion gap: 11 (ref 5–15)
BUN: 27 mg/dL — ABNORMAL HIGH (ref 8–23)
CO2: 25 mmol/L (ref 22–32)
Calcium: 9.2 mg/dL (ref 8.9–10.3)
Chloride: 104 mmol/L (ref 98–111)
Creatinine, Ser: 0.81 mg/dL (ref 0.44–1.00)
GFR, Estimated: >60 mL/min (ref >60)
Glucose, Bld: 87 mg/dL (ref 70–99)
Potassium: 3.7 mmol/L (ref 3.5–5.1)
Sodium: 140 mmol/L (ref 135–145)
Total Bilirubin: 0.5 mg/dL (ref 0.0–1.2)
Total Protein: 6.4 g/dL — ABNORMAL LOW (ref 6.5–8.1)

## 2023-06-19 NOTE — ED Notes (Signed)
 Back from Xray.

## 2023-06-19 NOTE — ED Triage Notes (Signed)
 Patient arrives via EMS for  right leg pain with swelling and redness. Patient is from [facility]. HX of dementia.  Patient was able to ambulate from the stretcher to the wheelchair with assistance. EMS reports the right leg is warm to touch. Nursing staff noticed the swelling and redness today. Denies falls or trauma to the right leg. Rates pain 1/10.

## 2023-06-20 ENCOUNTER — Emergency Department (HOSPITAL_BASED_OUTPATIENT_CLINIC_OR_DEPARTMENT_OTHER): Payer: Medicare (Managed Care)

## 2023-06-20 DIAGNOSIS — M7989 Other specified soft tissue disorders: Secondary | ICD-10-CM | POA: Diagnosis not present

## 2023-06-20 MED ORDER — CEPHALEXIN 500 MG PO CAPS
500.0000 mg | ORAL_CAPSULE | Freq: Three times a day (TID) | ORAL | 0 refills | Status: AC
Start: 2023-06-20 — End: 2023-06-27

## 2023-06-20 NOTE — ED Notes (Signed)
 Patient constantly attempting to get out of bed. Bed alarm on and verbal redirection used to assist patient back into bed

## 2023-06-20 NOTE — ED Provider Notes (Signed)
 [facility] AT [facility] Provider Note   CSN: [id number] Arrival date & time: 06/19/23  2206     History  Chief Complaint  Patient presents with   Leg Pain    Teresa Francis is a 83 y.o. female with PMH as listed below who presents via EMS for right leg pain with swelling and redness. Patient is from [facility]. HX of dementia. Patient was able to ambulate from the stretcher to the wheelchair with assistance. EMS reports the right leg is warm to touch. Nursing staff noticed the swelling and redness today. Denies falls or trauma to the right leg. Rates pain 1/10. No known h/o DVT/PE. Denies CP/SOB.   Past Medical History:  Diagnosis Date   Alzheimer disease (HCC)    Depression    GERD (gastroesophageal reflux disease)    Hyperlipidemia    Hypertension        Home Medications Prior to Admission medications   Medication Sig Start Date End Date Taking? Authorizing Provider  acetaminophen (TYLENOL) 325 MG tablet Take 2 tablets (650 mg total) by mouth every 6 (six) hours as needed for mild pain. Patient taking differently: Take 650 mg by mouth every 8 (eight) hours as needed for mild pain. 07/16/16   Adam Phenix, PA-C  amLODipine (NORVASC) 5 MG tablet Take 2 tablets (10 mg total) by mouth daily. 04/10/22   Glade Lloyd, MD  bisacodyl 5 MG EC tablet Take 10 mg by mouth daily as needed for moderate constipation.    [provider]  Calcium Carb-Cholecalciferol (CALCIUM 600/VITAMIN D PO) Take 1 tablet by mouth daily.    [provider]  Cholecalciferol (VITAMIN D) 50 MCG (2000 UT) CAPS Take 2,000 Units by mouth daily.    [provider]  divalproex (DEPAKOTE) 125 MG DR tablet Take 375 mg by mouth every evening.    [provider]  divalproex (DEPAKOTE) 500 MG DR tablet Take 1 tablet by mouth daily. 12/05/18   [provider]  fluticasone (FLONASE) 50 MCG/ACT nasal spray Place 1 spray  into both nostrils at bedtime.    [provider]  Melatonin 5 MG TABS Take 1 tablet by mouth at bedtime and may repeat dose one time if needed.     [provider]  MEMANTINE HCL ER PO Take 28 mg by mouth daily.    [provider]  pantoprazole (PROTONIX) 40 MG tablet Take 1 tablet (40 mg total) by mouth 2 (two) times daily. Patient taking differently: Take 20 mg by mouth 2 (two) times daily. 01/07/19   Lorre Munroe, NP  senna (SENOKOT) 8.6 MG TABS tablet Take 1 tablet (8.6 mg total) by mouth 2 (two) times daily. 04/10/22   Glade Lloyd, MD  sertraline (ZOLOFT) 50 MG tablet Take 1.5 tablets (75 mg total) by mouth daily. 04/10/22   Glade Lloyd, MD  vitamin B-12 (CYANOCOBALAMIN) 1000 MCG tablet Take 1,000 mcg by mouth daily.    [provider]  vitamin E 400 UNIT capsule Take 400 Units by mouth daily.    [provider]      Allergies    Lisinopril    Review of Systems   Review of Systems A 10 point review of systems was performed and is negative unless otherwise reported in HPI.  Physical Exam Updated Vital Signs BP (!) 148/64 (BP Location: Right Arm)   Pulse 62   Temp 97.9 F (36.6 C) (Oral)   Resp 16  Ht 5\' 8"  (1.727 m)   Wt 62.5 kg   SpO2 98%   BMI 20.95 kg/m  Physical Exam General: Normal appearing female, lying in bed.  HEENT: PERRLA, Sclera anicteric, MMM, trachea midline.  Cardiology: RRR, no murmurs/rubs/gallops. BL radial and DP pulses equal bilaterally.  Resp: Normal respiratory rate and effort. CTAB, no wheezes, rhonchi, crackles.  Abd: Soft, non-tender, non-distended. No rebound tenderness or guarding.  GU: Deferred. MSK: Diffusely swollen and erythematous RLE, mildly TTP. Compartments soft. Intact distal pulses/sensation. No induration or fluctuance. No signs of trauma. Extremities without deformity. No cyanosis or clubbing. Skin: warm, dry. No rashes or lesions. Back: No CVA tenderness Neuro: A&Ox4, CNs II-XII  grossly intact. MAEs. Sensation grossly intact.  Psych: Normal mood and affect.   ED Results / Procedures / Treatments   Labs (all labs ordered are listed, but only abnormal results are displayed) Labs Reviewed  COMPREHENSIVE METABOLIC PANEL WITH GFR - Abnormal; Notable for the following components:      Result Value   BUN 27 (*)    Total Protein 6.4 (*)    All other components within normal limits  CBC WITH DIFFERENTIAL/PLATELET    EKG None  Radiology DG Tibia/Fibula Right Port Result Date: 06/19/2023 CLINICAL DATA:  Swelling and right leg pain. EXAM: PORTABLE RIGHT TIBIA AND FIBULA - 2 VIEW COMPARISON:  None Available. FINDINGS: There is no evidence of fracture or other focal bone lesions. The bones are subjectively under mineralized. No erosions or bone destruction. Generalized soft tissue edema. IMPRESSION: 1. Generalized soft tissue edema. No acute osseous abnormality. 2. Osteopenia/osteoporosis. Electronically Signed   By: Narda Rutherford M.D.   On: 06/19/2023 22:55    Procedures Procedures    Medications Ordered in ED Medications - No data to display  ED Course/ Medical Decision Making/ A&P                          Medical Decision Making Amount and/or Complexity of Data Reviewed Labs: ordered. Decision-making details documented in ED Course. Radiology: ordered. Decision-making details documented in ED Course.    This patient presents to the ED for concern of RLE swelling/pain, this involves an extensive number of treatment options, and is a complaint that carries with it a high risk of complications and morbidity.  I considered the following differential and admission for this acute, potentially life threatening condition. Patient is overall well-appearing, HDS, afebrile.  MDM:    Consider DVT vs cellulitis most likely. Patient had XR ordered from triage that showed no fracture/dislocation but just soft tissue edema. She has no focal areas of induration/fluctuance  noted, no areas for drainage, no wounds.  Her left lower extremity is not swollen, no bilateral swelling to indicate heart failure exacerbation or renal injury.  She does not recall any history of DVT PE.  Her labs do not demonstrate any leukocytosis and she does not have any fever, therefore I have higher degree of concern for DVT.  It is after hours for ultrasound, and patient will need to be transported back to her facility tonight by [facility] only to be returned in the AM for DVT US. I believe it makes more sense for the patient to stay for a few hours overnight in the ED and await the [place].  She denies any chest pain or shortness of breath indicate PE.  Clinical Course as of 06/20/23 5366  Sun Jun 20, 2023  0008 DG Tibia/Fibula Right Port 1. Generalized  soft tissue edema. No acute osseous abnormality. 2. Osteopenia/osteoporosis.   [HN]  0008 WBC: 10.5 No leukocytosis, no left shift [HN]  0008 Comprehensive metabolic panel(!) unremarkable [HN]    Clinical Course User Index [HN] Loetta Rough, MD    Labs: I Ordered, and personally interpreted labs.  The pertinent results include: Those listed above  Imaging Studies ordered: I ordered imaging studies including DVT ultrasound I independently visualized and interpreted imaging. I agree with the radiologist interpretation  Additional history obtained from chart review.    Social Determinants of Health:  lives at facility  Disposition: Patient is signed out to the oncoming ED physician Dr. Lynelle Doctor who is made aware of her history, presentation, exam, workup, and plan.    Co morbidities that complicate the patient evaluation  Past Medical History:  Diagnosis Date   Alzheimer disease (HCC)    Depression    GERD (gastroesophageal reflux disease)    Hyperlipidemia    Hypertension      Medicines No orders of the defined types were placed in this encounter.   I have reviewed the patients home medicines and have made adjustments as  needed  Problem List / ED Course: Problem List Items Addressed This Visit   None Visit Diagnoses       Pain and swelling of right lower leg    -  Primary                   This note was created using dictation software, which may contain spelling or grammatical errors.    Loetta Rough, MD 06/20/23 [phone]

## 2023-06-20 NOTE — Progress Notes (Signed)
 VASCULAR LAB    Right lower extremity venous duplex has been performed.  See CV proc for preliminary results.  Relayed negative results to Dr. Lynelle Doctor via secure chat  Sherren Kerns, RVT 06/20/2023, 8:32 AM

## 2023-06-20 NOTE — ED Notes (Signed)
 Patient transported to [place]

## 2023-06-20 NOTE — ED Notes (Signed)
 Attempted to call report to [facility] and to Pt daughter Dondra Spry. Both calls went to voicemail.

## 2023-06-20 NOTE — ED Notes (Signed)
 [facility] updated on patient's plan of care.

## 2023-06-20 NOTE — ED Notes (Signed)
 Pt returned from [place]

## 2023-06-20 NOTE — ED Notes (Signed)
 [facility] onsite to transport to facility

## 2023-06-20 NOTE — ED Provider Notes (Signed)
 Patient initially seen by Dr. Jearld Fenton.  Please see her note.  Patient was waiting in the ED for Doppler study of leg swelling.  Patient's Doppler study is negative for DVT.  Plan was to treat for cellulitis if no signs of DVT.  Will discharge back to the nursing [facility] with a prescription for Keflex.  Findings and plan discussed with patient.   Linwood Dibbles, MD 06/20/23 [phone]

## 2023-06-20 NOTE — Discharge Instructions (Addendum)
 Take the antibiotics as prescribed for possible infection in your lower leg.  Follow-up with your doctor to be rechecked.  Return to the ER for fevers chills or other concerning symptoms

## 2023-06-20 NOTE — ED Notes (Signed)
 Pt adjusted in bed, gown put on pt, warm blankets provided. Bed alarm on.
